# Patient Record
Sex: Male | Born: 1935 | Race: White | Hispanic: No | Marital: Married | Smoking: Former smoker
Health system: Southern US, Community
[De-identification: ages and names within clinical notes are randomized; demographics above are authoritative.]

## PROBLEM LIST (undated history)

## (undated) DIAGNOSIS — K922 Gastrointestinal hemorrhage, unspecified: Secondary | ICD-10-CM

## (undated) DIAGNOSIS — E291 Testicular hypofunction: Secondary | ICD-10-CM

## (undated) DIAGNOSIS — K279 Peptic ulcer, site unspecified, unspecified as acute or chronic, without hemorrhage or perforation: Secondary | ICD-10-CM

## (undated) DIAGNOSIS — K859 Acute pancreatitis without necrosis or infection, unspecified: Secondary | ICD-10-CM

## (undated) DIAGNOSIS — M1990 Unspecified osteoarthritis, unspecified site: Secondary | ICD-10-CM

## (undated) DIAGNOSIS — I2510 Atherosclerotic heart disease of native coronary artery without angina pectoris: Secondary | ICD-10-CM

## (undated) DIAGNOSIS — N289 Disorder of kidney and ureter, unspecified: Secondary | ICD-10-CM

## (undated) DIAGNOSIS — E041 Nontoxic single thyroid nodule: Secondary | ICD-10-CM

## (undated) DIAGNOSIS — E785 Hyperlipidemia, unspecified: Secondary | ICD-10-CM

## (undated) DIAGNOSIS — I252 Old myocardial infarction: Secondary | ICD-10-CM

## (undated) DIAGNOSIS — I10 Essential (primary) hypertension: Secondary | ICD-10-CM

## (undated) HISTORY — DX: Peptic ulcer, site unspecified, unspecified as acute or chronic, without hemorrhage or perforation: K27.9

## (undated) HISTORY — DX: Essential (primary) hypertension: I10

## (undated) HISTORY — DX: Nontoxic single thyroid nodule: E04.1

## (undated) HISTORY — DX: Disorder of kidney and ureter, unspecified: N28.9

## (undated) HISTORY — DX: Testicular hypofunction: E29.1

## (undated) HISTORY — DX: Hyperlipidemia, unspecified: E78.5

## (undated) HISTORY — PX: OTHER SURGICAL HISTORY: SHX169

## (undated) HISTORY — PX: CARDIAC CATHETERIZATION: SHX172

## (undated) HISTORY — PX: CARDIAC DEFIBRILLATOR PLACEMENT: SHX171

## (undated) HISTORY — DX: Unspecified osteoarthritis, unspecified site: M19.90

## (undated) HISTORY — DX: Acute pancreatitis without necrosis or infection, unspecified: K85.90

## (undated) HISTORY — PX: LESION REMOVAL: SHX5196

## (undated) HISTORY — DX: Gastrointestinal hemorrhage, unspecified: K92.2

## (undated) HISTORY — PX: LAPAROTOMY: SHX154

---

## 1959-02-16 DIAGNOSIS — K279 Peptic ulcer, site unspecified, unspecified as acute or chronic, without hemorrhage or perforation: Secondary | ICD-10-CM

## 1959-02-16 HISTORY — DX: Peptic ulcer, site unspecified, unspecified as acute or chronic, without hemorrhage or perforation: K27.9

## 1963-06-18 DIAGNOSIS — Z8711 Personal history of peptic ulcer disease: Secondary | ICD-10-CM | POA: Insufficient documentation

## 1964-06-17 DIAGNOSIS — Z87448 Personal history of other diseases of urinary system: Secondary | ICD-10-CM | POA: Insufficient documentation

## 1969-02-15 HISTORY — PX: CHOLECYSTECTOMY: SHX55

## 1981-06-17 DIAGNOSIS — I10 Essential (primary) hypertension: Secondary | ICD-10-CM

## 1988-06-17 DIAGNOSIS — M129 Arthropathy, unspecified: Secondary | ICD-10-CM | POA: Insufficient documentation

## 1990-12-08 ENCOUNTER — Encounter (INDEPENDENT_AMBULATORY_CARE_PROVIDER_SITE_OTHER): Payer: Self-pay | Admitting: Gastroenterology

## 1993-06-17 DIAGNOSIS — E785 Hyperlipidemia, unspecified: Secondary | ICD-10-CM | POA: Insufficient documentation

## 1997-06-17 ENCOUNTER — Encounter: Payer: Self-pay | Admitting: Family Medicine

## 1997-06-17 DIAGNOSIS — E1165 Type 2 diabetes mellitus with hyperglycemia: Secondary | ICD-10-CM

## 1997-06-17 DIAGNOSIS — E1129 Type 2 diabetes mellitus with other diabetic kidney complication: Secondary | ICD-10-CM

## 1997-06-17 HISTORY — PX: OTHER SURGICAL HISTORY: SHX169

## 1997-07-18 HISTORY — PX: OTHER SURGICAL HISTORY: SHX169

## 1998-10-16 ENCOUNTER — Encounter: Payer: Self-pay | Admitting: Family Medicine

## 1998-10-16 LAB — CONVERTED CEMR LAB: Microalbumin U total vol: 7.5 mg/L

## 1999-03-18 ENCOUNTER — Encounter: Payer: Self-pay | Admitting: Family Medicine

## 2000-01-16 ENCOUNTER — Encounter: Payer: Self-pay | Admitting: Family Medicine

## 2000-01-16 LAB — CONVERTED CEMR LAB
Microalbumin U total vol: 3.9 mg/L
PSA: 0.2 ng/mL

## 2000-09-08 ENCOUNTER — Encounter: Payer: Self-pay | Admitting: Family Medicine

## 2000-09-08 ENCOUNTER — Encounter: Admission: RE | Admit: 2000-09-08 | Discharge: 2000-09-08 | Payer: Self-pay | Admitting: Family Medicine

## 2000-09-08 ENCOUNTER — Inpatient Hospital Stay (HOSPITAL_COMMUNITY): Admission: EM | Admit: 2000-09-08 | Discharge: 2000-09-12 | Payer: Self-pay | Admitting: Internal Medicine

## 2000-09-09 ENCOUNTER — Encounter: Payer: Self-pay | Admitting: Internal Medicine

## 2000-09-10 ENCOUNTER — Encounter: Payer: Self-pay | Admitting: *Deleted

## 2000-09-10 ENCOUNTER — Encounter: Payer: Self-pay | Admitting: Gastroenterology

## 2000-09-11 ENCOUNTER — Encounter: Payer: Self-pay | Admitting: Gastroenterology

## 2000-12-15 DIAGNOSIS — K279 Peptic ulcer, site unspecified, unspecified as acute or chronic, without hemorrhage or perforation: Secondary | ICD-10-CM | POA: Insufficient documentation

## 2001-01-15 ENCOUNTER — Encounter: Payer: Self-pay | Admitting: Family Medicine

## 2001-01-15 LAB — CONVERTED CEMR LAB: PSA: 0.3 ng/mL

## 2001-07-08 DIAGNOSIS — E349 Endocrine disorder, unspecified: Secondary | ICD-10-CM | POA: Insufficient documentation

## 2002-01-15 ENCOUNTER — Encounter: Payer: Self-pay | Admitting: Family Medicine

## 2002-07-22 ENCOUNTER — Encounter: Payer: Self-pay | Admitting: Family Medicine

## 2002-07-22 ENCOUNTER — Encounter: Admission: RE | Admit: 2002-07-22 | Discharge: 2002-07-22 | Payer: Self-pay | Admitting: Family Medicine

## 2002-10-26 ENCOUNTER — Encounter: Payer: Self-pay | Admitting: Family Medicine

## 2002-10-26 ENCOUNTER — Encounter: Admission: RE | Admit: 2002-10-26 | Discharge: 2002-10-26 | Payer: Self-pay | Admitting: Family Medicine

## 2003-01-16 ENCOUNTER — Encounter: Payer: Self-pay | Admitting: Family Medicine

## 2003-01-16 LAB — CONVERTED CEMR LAB
Hgb A1c MFr Bld: 6.8 %
Microalbumin U total vol: 11.2 mg/L
PSA: 0.5 ng/mL

## 2003-07-19 ENCOUNTER — Encounter: Payer: Self-pay | Admitting: Family Medicine

## 2003-07-19 LAB — CONVERTED CEMR LAB: Hgb A1c MFr Bld: 8.3 %

## 2003-10-16 ENCOUNTER — Encounter: Payer: Self-pay | Admitting: Family Medicine

## 2004-04-18 ENCOUNTER — Ambulatory Visit: Payer: Self-pay | Admitting: Family Medicine

## 2004-05-16 ENCOUNTER — Ambulatory Visit: Payer: Self-pay | Admitting: Family Medicine

## 2004-06-14 ENCOUNTER — Ambulatory Visit: Payer: Self-pay | Admitting: Family Medicine

## 2004-07-13 ENCOUNTER — Ambulatory Visit: Payer: Self-pay | Admitting: Family Medicine

## 2004-08-15 ENCOUNTER — Ambulatory Visit: Payer: Self-pay | Admitting: Family Medicine

## 2004-08-15 LAB — CONVERTED CEMR LAB
Hgb A1c MFr Bld: 6.9 %
PSA: 0.38 ng/mL

## 2004-09-10 ENCOUNTER — Ambulatory Visit: Payer: Self-pay | Admitting: Family Medicine

## 2004-09-11 ENCOUNTER — Ambulatory Visit: Payer: Self-pay | Admitting: Family Medicine

## 2004-10-10 ENCOUNTER — Ambulatory Visit: Payer: Self-pay | Admitting: Family Medicine

## 2004-11-20 ENCOUNTER — Ambulatory Visit: Payer: Self-pay | Admitting: Family Medicine

## 2004-12-13 ENCOUNTER — Ambulatory Visit: Payer: Self-pay | Admitting: Family Medicine

## 2005-01-18 ENCOUNTER — Ambulatory Visit: Payer: Self-pay | Admitting: Family Medicine

## 2005-02-22 ENCOUNTER — Ambulatory Visit: Payer: Self-pay | Admitting: Family Medicine

## 2005-03-12 ENCOUNTER — Ambulatory Visit: Payer: Self-pay | Admitting: Family Medicine

## 2005-03-14 ENCOUNTER — Ambulatory Visit: Payer: Self-pay | Admitting: Family Medicine

## 2005-03-19 ENCOUNTER — Ambulatory Visit: Payer: Self-pay | Admitting: Family Medicine

## 2005-04-17 ENCOUNTER — Encounter: Payer: Self-pay | Admitting: Family Medicine

## 2005-04-19 ENCOUNTER — Ambulatory Visit: Payer: Self-pay | Admitting: Family Medicine

## 2005-05-16 ENCOUNTER — Ambulatory Visit: Payer: Self-pay | Admitting: Family Medicine

## 2005-05-21 ENCOUNTER — Ambulatory Visit: Payer: Self-pay | Admitting: Family Medicine

## 2005-06-19 ENCOUNTER — Ambulatory Visit: Payer: Self-pay | Admitting: Family Medicine

## 2005-07-17 ENCOUNTER — Ambulatory Visit: Payer: Self-pay | Admitting: Family Medicine

## 2005-08-27 ENCOUNTER — Ambulatory Visit: Payer: Self-pay | Admitting: Family Medicine

## 2005-09-16 ENCOUNTER — Ambulatory Visit: Payer: Self-pay | Admitting: Family Medicine

## 2005-10-22 ENCOUNTER — Ambulatory Visit: Payer: Self-pay | Admitting: Family Medicine

## 2005-11-20 ENCOUNTER — Ambulatory Visit: Payer: Self-pay | Admitting: Family Medicine

## 2005-12-15 ENCOUNTER — Encounter: Payer: Self-pay | Admitting: Family Medicine

## 2005-12-15 LAB — CONVERTED CEMR LAB: Hgb A1c MFr Bld: 7.6 %

## 2005-12-23 ENCOUNTER — Ambulatory Visit: Payer: Self-pay | Admitting: Family Medicine

## 2005-12-30 ENCOUNTER — Ambulatory Visit: Payer: Self-pay | Admitting: Family Medicine

## 2006-01-02 ENCOUNTER — Ambulatory Visit: Payer: Self-pay | Admitting: Family Medicine

## 2006-01-17 ENCOUNTER — Ambulatory Visit: Payer: Self-pay | Admitting: Family Medicine

## 2006-02-18 ENCOUNTER — Ambulatory Visit: Payer: Self-pay | Admitting: Family Medicine

## 2006-02-20 ENCOUNTER — Ambulatory Visit: Payer: Self-pay | Admitting: Family Medicine

## 2006-03-19 ENCOUNTER — Ambulatory Visit: Payer: Self-pay | Admitting: Internal Medicine

## 2006-04-03 ENCOUNTER — Ambulatory Visit: Payer: Self-pay | Admitting: Family Medicine

## 2006-04-17 ENCOUNTER — Encounter: Payer: Self-pay | Admitting: Family Medicine

## 2006-04-17 LAB — CONVERTED CEMR LAB: Hgb A1c MFr Bld: 7.3 %

## 2006-04-18 ENCOUNTER — Ambulatory Visit: Payer: Self-pay | Admitting: Family Medicine

## 2006-05-16 ENCOUNTER — Ambulatory Visit: Payer: Self-pay | Admitting: Family Medicine

## 2006-05-20 ENCOUNTER — Ambulatory Visit: Payer: Self-pay | Admitting: Family Medicine

## 2006-06-27 ENCOUNTER — Ambulatory Visit: Payer: Self-pay | Admitting: Family Medicine

## 2006-07-22 ENCOUNTER — Ambulatory Visit: Payer: Self-pay | Admitting: Family Medicine

## 2006-08-20 ENCOUNTER — Ambulatory Visit: Payer: Self-pay | Admitting: Family Medicine

## 2006-09-20 ENCOUNTER — Encounter: Payer: Self-pay | Admitting: Family Medicine

## 2006-09-23 ENCOUNTER — Ambulatory Visit: Payer: Self-pay | Admitting: Family Medicine

## 2006-10-31 ENCOUNTER — Ambulatory Visit: Payer: Self-pay | Admitting: Family Medicine

## 2006-11-13 ENCOUNTER — Encounter: Admission: RE | Admit: 2006-11-13 | Discharge: 2006-11-13 | Payer: Self-pay | Admitting: General Surgery

## 2006-11-14 ENCOUNTER — Ambulatory Visit (HOSPITAL_BASED_OUTPATIENT_CLINIC_OR_DEPARTMENT_OTHER): Admission: RE | Admit: 2006-11-14 | Discharge: 2006-11-14 | Payer: Self-pay | Admitting: General Surgery

## 2006-11-14 ENCOUNTER — Encounter (INDEPENDENT_AMBULATORY_CARE_PROVIDER_SITE_OTHER): Payer: Self-pay | Admitting: General Surgery

## 2006-11-26 ENCOUNTER — Ambulatory Visit: Payer: Self-pay | Admitting: Family Medicine

## 2006-12-10 ENCOUNTER — Encounter: Payer: Self-pay | Admitting: Family Medicine

## 2006-12-18 ENCOUNTER — Ambulatory Visit: Payer: Self-pay | Admitting: Family Medicine

## 2006-12-31 ENCOUNTER — Encounter (INDEPENDENT_AMBULATORY_CARE_PROVIDER_SITE_OTHER): Payer: Self-pay | Admitting: *Deleted

## 2006-12-31 ENCOUNTER — Ambulatory Visit: Payer: Self-pay | Admitting: Family Medicine

## 2006-12-31 DIAGNOSIS — M129 Arthropathy, unspecified: Secondary | ICD-10-CM | POA: Insufficient documentation

## 2006-12-31 LAB — CONVERTED CEMR LAB
Bilirubin, Direct: 0.1 mg/dL (ref 0.0–0.3)
Calcium: 9.3 mg/dL (ref 8.4–10.5)
Creatinine,U: 170.7 mg/dL
Eosinophils Absolute: 0.2 10*3/uL (ref 0.0–0.6)
Eosinophils Relative: 3.3 % (ref 0.0–5.0)
GFR calc Af Amer: 42 mL/min
GFR calc non Af Amer: 34 mL/min
Glucose, Bld: 113 mg/dL — ABNORMAL HIGH (ref 70–99)
HDL: 38.5 mg/dL — ABNORMAL LOW (ref >39.0)
Hgb A1c MFr Bld: 7.1 % — ABNORMAL HIGH (ref 4.6–6.0)
Lymphocytes Relative: 28.1 % (ref 12.0–46.0)
MCHC: 33.9 g/dL (ref 30.0–36.0)
MCV: 87 fL (ref 78.0–100.0)
Microalb Creat Ratio: 13.5 mg/g (ref 0.0–30.0)
Neutro Abs: 2.8 10*3/uL (ref 1.4–7.7)
Neutrophils Relative %: 55.3 % (ref 43.0–77.0)
Platelets: 252 10*3/uL (ref 150–400)
Sodium: 143 meq/L (ref 135–145)
WBC: 5 10*3/uL (ref 4.5–10.5)

## 2007-01-05 ENCOUNTER — Ambulatory Visit: Payer: Self-pay | Admitting: Family Medicine

## 2007-01-30 ENCOUNTER — Ambulatory Visit: Payer: Self-pay | Admitting: Family Medicine

## 2007-02-25 ENCOUNTER — Ambulatory Visit: Payer: Self-pay | Admitting: Family Medicine

## 2007-03-27 ENCOUNTER — Ambulatory Visit: Payer: Self-pay | Admitting: Family Medicine

## 2007-04-23 ENCOUNTER — Ambulatory Visit: Payer: Self-pay | Admitting: Family Medicine

## 2007-05-25 ENCOUNTER — Ambulatory Visit: Payer: Self-pay | Admitting: Family Medicine

## 2007-06-26 ENCOUNTER — Ambulatory Visit: Payer: Self-pay | Admitting: Family Medicine

## 2007-07-27 ENCOUNTER — Ambulatory Visit: Payer: Self-pay | Admitting: Family Medicine

## 2007-08-20 ENCOUNTER — Ambulatory Visit: Payer: Self-pay | Admitting: Family Medicine

## 2007-08-26 ENCOUNTER — Ambulatory Visit: Payer: Self-pay | Admitting: Internal Medicine

## 2007-09-07 ENCOUNTER — Ambulatory Visit: Payer: Self-pay | Admitting: Family Medicine

## 2007-09-07 DIAGNOSIS — E669 Obesity, unspecified: Secondary | ICD-10-CM | POA: Insufficient documentation

## 2007-09-17 ENCOUNTER — Ambulatory Visit: Payer: Self-pay | Admitting: Family Medicine

## 2007-09-17 DIAGNOSIS — R197 Diarrhea, unspecified: Secondary | ICD-10-CM

## 2007-10-01 ENCOUNTER — Ambulatory Visit: Payer: Self-pay | Admitting: Family Medicine

## 2007-10-06 ENCOUNTER — Ambulatory Visit: Payer: Self-pay | Admitting: Family Medicine

## 2007-10-15 ENCOUNTER — Ambulatory Visit: Payer: Self-pay | Admitting: Family Medicine

## 2007-11-02 ENCOUNTER — Ambulatory Visit: Payer: Self-pay | Admitting: Family Medicine

## 2007-11-03 ENCOUNTER — Encounter (INDEPENDENT_AMBULATORY_CARE_PROVIDER_SITE_OTHER): Payer: Self-pay | Admitting: Internal Medicine

## 2007-11-27 ENCOUNTER — Ambulatory Visit: Payer: Self-pay | Admitting: Internal Medicine

## 2007-11-27 DIAGNOSIS — K589 Irritable bowel syndrome without diarrhea: Secondary | ICD-10-CM | POA: Insufficient documentation

## 2007-11-27 DIAGNOSIS — M1990 Unspecified osteoarthritis, unspecified site: Secondary | ICD-10-CM | POA: Insufficient documentation

## 2007-12-01 ENCOUNTER — Ambulatory Visit: Payer: Self-pay | Admitting: Family Medicine

## 2007-12-01 LAB — CONVERTED CEMR LAB
ALT: 27 units/L (ref 0–53)
AST: 29 units/L (ref 0–37)
Basophils Absolute: 0 10*3/uL (ref 0.0–0.1)
Basophils Relative: 0.9 % (ref 0.0–1.0)
CO2: 23 meq/L (ref 19–32)
Creatinine, Ser: 1.5 mg/dL (ref 0.4–1.5)
Eosinophils Absolute: 0.1 10*3/uL (ref 0.0–0.7)
GFR calc Af Amer: 58 mL/min
Hemoglobin: 13.6 g/dL (ref 13.0–17.0)
MCHC: 34.3 g/dL (ref 30.0–36.0)
MCV: 86.3 fL (ref 78.0–100.0)
Neutro Abs: 3.6 10*3/uL (ref 1.4–7.7)
RBC: 4.6 M/uL (ref 4.22–5.81)
Total Bilirubin: 0.7 mg/dL (ref 0.3–1.2)

## 2007-12-16 HISTORY — PX: COLONOSCOPY: SHX174

## 2007-12-28 ENCOUNTER — Ambulatory Visit: Payer: Self-pay | Admitting: Family Medicine

## 2007-12-29 ENCOUNTER — Encounter: Payer: Self-pay | Admitting: Internal Medicine

## 2007-12-29 ENCOUNTER — Ambulatory Visit: Payer: Self-pay | Admitting: Internal Medicine

## 2007-12-29 LAB — HM COLONOSCOPY: HM Colonoscopy: 4

## 2008-02-02 ENCOUNTER — Ambulatory Visit: Payer: Self-pay | Admitting: Family Medicine

## 2008-02-05 ENCOUNTER — Ambulatory Visit: Payer: Self-pay | Admitting: Family Medicine

## 2008-02-06 LAB — CONVERTED CEMR LAB
Alkaline Phosphatase: 54 units/L (ref 39–117)
Basophils Absolute: 0 10*3/uL (ref 0.0–0.1)
Bilirubin, Direct: 0.1 mg/dL (ref 0.0–0.3)
Cholesterol: 223 mg/dL (ref 0–200)
Eosinophils Absolute: 0.2 10*3/uL (ref 0.0–0.7)
GFR calc Af Amer: 50 mL/min
GFR calc non Af Amer: 41 mL/min
HCT: 39.8 % (ref 39.0–52.0)
Hgb A1c MFr Bld: 7.8 % — ABNORMAL HIGH (ref 4.6–6.0)
MCHC: 33.7 g/dL (ref 30.0–36.0)
MCV: 87.3 fL (ref 78.0–100.0)
Microalb Creat Ratio: 43.2 mg/g — ABNORMAL HIGH (ref 0.0–30.0)
Microalb, Ur: 7 mg/dL — ABNORMAL HIGH (ref 0.0–1.9)
Monocytes Absolute: 0.5 10*3/uL (ref 0.1–1.0)
Monocytes Relative: 8.9 % (ref 3.0–12.0)
PSA: 0.44 ng/mL (ref 0.10–4.00)
Platelets: 239 10*3/uL (ref 150–400)
Potassium: 4.9 meq/L (ref 3.5–5.1)
RDW: 14.4 % (ref 11.5–14.6)
Sodium: 142 meq/L (ref 135–145)
Testosterone: 754.57 ng/dL (ref 350.00–890)
Total Bilirubin: 0.7 mg/dL (ref 0.3–1.2)
Total CHOL/HDL Ratio: 6.6
Triglycerides: 220 mg/dL (ref 0–149)

## 2008-02-10 ENCOUNTER — Ambulatory Visit: Payer: Self-pay | Admitting: Family Medicine

## 2008-02-25 ENCOUNTER — Ambulatory Visit: Payer: Self-pay | Admitting: Family Medicine

## 2008-03-04 ENCOUNTER — Telehealth: Payer: Self-pay | Admitting: Family Medicine

## 2008-03-23 ENCOUNTER — Ambulatory Visit: Payer: Self-pay | Admitting: Family Medicine

## 2008-04-21 ENCOUNTER — Telehealth: Payer: Self-pay | Admitting: Family Medicine

## 2008-04-21 ENCOUNTER — Ambulatory Visit: Payer: Self-pay | Admitting: Family Medicine

## 2008-05-25 ENCOUNTER — Ambulatory Visit: Payer: Self-pay | Admitting: Family Medicine

## 2008-06-21 ENCOUNTER — Encounter: Payer: Self-pay | Admitting: Family Medicine

## 2008-06-22 ENCOUNTER — Ambulatory Visit: Payer: Self-pay | Admitting: Family Medicine

## 2008-07-10 ENCOUNTER — Ambulatory Visit: Payer: Self-pay | Admitting: Internal Medicine

## 2008-07-10 ENCOUNTER — Inpatient Hospital Stay (HOSPITAL_COMMUNITY): Admission: EM | Admit: 2008-07-10 | Discharge: 2008-07-13 | Payer: Self-pay | Admitting: Emergency Medicine

## 2008-07-11 ENCOUNTER — Ambulatory Visit: Payer: Self-pay | Admitting: Gastroenterology

## 2008-07-11 ENCOUNTER — Encounter: Payer: Self-pay | Admitting: Internal Medicine

## 2008-07-12 ENCOUNTER — Encounter: Payer: Self-pay | Admitting: Gastroenterology

## 2008-07-12 ENCOUNTER — Encounter: Payer: Self-pay | Admitting: Family Medicine

## 2008-07-12 HISTORY — PX: ESOPHAGOGASTRODUODENOSCOPY: SHX1529

## 2008-07-12 HISTORY — PX: OTHER SURGICAL HISTORY: SHX169

## 2008-07-13 ENCOUNTER — Encounter: Payer: Self-pay | Admitting: Family Medicine

## 2008-07-13 ENCOUNTER — Telehealth: Payer: Self-pay | Admitting: Internal Medicine

## 2008-07-19 ENCOUNTER — Ambulatory Visit: Payer: Self-pay | Admitting: Family Medicine

## 2008-07-26 ENCOUNTER — Ambulatory Visit: Payer: Self-pay | Admitting: Family Medicine

## 2008-08-02 ENCOUNTER — Ambulatory Visit: Payer: Self-pay | Admitting: Internal Medicine

## 2008-08-02 ENCOUNTER — Ambulatory Visit: Payer: Self-pay | Admitting: Cardiology

## 2008-08-02 DIAGNOSIS — K219 Gastro-esophageal reflux disease without esophagitis: Secondary | ICD-10-CM

## 2008-08-03 DIAGNOSIS — R933 Abnormal findings on diagnostic imaging of other parts of digestive tract: Secondary | ICD-10-CM

## 2008-08-03 LAB — CONVERTED CEMR LAB
ALT: 30 units/L (ref 0–53)
AST: 29 units/L (ref 0–37)
Amylase: 33 units/L (ref 27–131)
BUN: 28 mg/dL — ABNORMAL HIGH (ref 6–23)
Creatinine, Ser: 1.8 mg/dL — ABNORMAL HIGH (ref 0.4–1.5)
GFR calc Af Amer: 47 mL/min
Lipase: 29 units/L (ref 11.0–59.0)
Total Bilirubin: 0.6 mg/dL (ref 0.3–1.2)

## 2008-08-05 ENCOUNTER — Encounter: Payer: Self-pay | Admitting: Cardiology

## 2008-08-05 ENCOUNTER — Encounter: Payer: Self-pay | Admitting: Family Medicine

## 2008-08-05 ENCOUNTER — Ambulatory Visit: Payer: Self-pay

## 2008-08-09 ENCOUNTER — Encounter: Admission: RE | Admit: 2008-08-09 | Discharge: 2008-08-09 | Payer: Self-pay | Admitting: Internal Medicine

## 2008-08-11 ENCOUNTER — Telehealth: Payer: Self-pay | Admitting: Family Medicine

## 2008-08-12 ENCOUNTER — Ambulatory Visit: Payer: Self-pay | Admitting: Cardiology

## 2008-08-12 LAB — CONVERTED CEMR LAB
Basophils Absolute: 0 10*3/uL (ref 0.0–0.1)
Basophils Relative: 0.4 % (ref 0.0–3.0)
CO2: 25 meq/L (ref 19–32)
Chloride: 103 meq/L (ref 96–112)
Creatinine, Ser: 1.8 mg/dL — ABNORMAL HIGH (ref 0.4–1.5)
Eosinophils Absolute: 0.3 10*3/uL (ref 0.0–0.7)
GFR calc non Af Amer: 39 mL/min
Lymphocytes Relative: 19.9 % (ref 12.0–46.0)
MCHC: 33.4 g/dL (ref 30.0–36.0)
MCV: 87.4 fL (ref 78.0–100.0)
Neutrophils Relative %: 61.9 % (ref 43.0–77.0)
Platelets: 192 10*3/uL (ref 150–400)
Potassium: 4.4 meq/L (ref 3.5–5.1)
Prothrombin Time: 11.2 s (ref 10.9–13.3)
RBC: 4.73 M/uL (ref 4.22–5.81)
WBC: 5.3 10*3/uL (ref 4.5–10.5)
aPTT: 28.5 s (ref 21.7–29.8)

## 2008-08-16 ENCOUNTER — Ambulatory Visit: Payer: Self-pay | Admitting: Cardiology

## 2008-08-16 ENCOUNTER — Inpatient Hospital Stay (HOSPITAL_COMMUNITY): Admission: AD | Admit: 2008-08-16 | Discharge: 2008-08-18 | Payer: Self-pay | Admitting: Cardiology

## 2008-08-23 ENCOUNTER — Encounter: Payer: Self-pay | Admitting: Internal Medicine

## 2008-08-23 ENCOUNTER — Ambulatory Visit: Payer: Self-pay | Admitting: Surgery

## 2008-08-23 ENCOUNTER — Encounter: Payer: Self-pay | Admitting: Family Medicine

## 2008-08-25 ENCOUNTER — Ambulatory Visit: Payer: Self-pay | Admitting: Family Medicine

## 2008-08-30 ENCOUNTER — Ambulatory Visit: Payer: Self-pay | Admitting: Surgery

## 2008-09-01 ENCOUNTER — Encounter: Payer: Self-pay | Admitting: Surgery

## 2008-09-01 ENCOUNTER — Ambulatory Visit (HOSPITAL_COMMUNITY): Admission: RE | Admit: 2008-09-01 | Discharge: 2008-09-01 | Payer: Self-pay | Admitting: Surgery

## 2008-09-05 ENCOUNTER — Inpatient Hospital Stay (HOSPITAL_COMMUNITY): Admission: RE | Admit: 2008-09-05 | Discharge: 2008-09-10 | Payer: Self-pay | Admitting: Surgery

## 2008-09-05 ENCOUNTER — Ambulatory Visit: Payer: Self-pay | Admitting: Surgery

## 2008-09-05 HISTORY — PX: CORONARY ARTERY BYPASS GRAFT: SHX141

## 2008-09-15 ENCOUNTER — Ambulatory Visit: Payer: Self-pay | Admitting: Cardiology

## 2008-09-15 ENCOUNTER — Encounter: Payer: Self-pay | Admitting: Cardiology

## 2008-09-22 ENCOUNTER — Ambulatory Visit: Payer: Self-pay | Admitting: Family Medicine

## 2008-09-29 ENCOUNTER — Encounter (HOSPITAL_COMMUNITY): Admission: RE | Admit: 2008-09-29 | Discharge: 2008-12-28 | Payer: Self-pay | Admitting: Cardiology

## 2008-09-29 ENCOUNTER — Encounter: Payer: Self-pay | Admitting: Family Medicine

## 2008-10-07 ENCOUNTER — Encounter: Admission: RE | Admit: 2008-10-07 | Discharge: 2008-10-07 | Payer: Self-pay | Admitting: Surgery

## 2008-10-07 ENCOUNTER — Ambulatory Visit: Payer: Self-pay | Admitting: Surgery

## 2008-10-20 ENCOUNTER — Telehealth: Payer: Self-pay | Admitting: Family Medicine

## 2008-10-21 ENCOUNTER — Ambulatory Visit: Payer: Self-pay | Admitting: Internal Medicine

## 2008-10-21 ENCOUNTER — Inpatient Hospital Stay (HOSPITAL_COMMUNITY): Admission: EM | Admit: 2008-10-21 | Discharge: 2008-10-26 | Payer: Self-pay | Admitting: Emergency Medicine

## 2008-10-22 ENCOUNTER — Telehealth (INDEPENDENT_AMBULATORY_CARE_PROVIDER_SITE_OTHER): Payer: Self-pay | Admitting: Physician Assistant

## 2008-10-26 ENCOUNTER — Encounter: Payer: Self-pay | Admitting: Family Medicine

## 2008-10-27 ENCOUNTER — Ambulatory Visit: Payer: Self-pay | Admitting: Family Medicine

## 2008-10-31 ENCOUNTER — Encounter: Payer: Self-pay | Admitting: Family Medicine

## 2008-11-10 ENCOUNTER — Ambulatory Visit: Payer: Self-pay | Admitting: Family Medicine

## 2008-11-17 ENCOUNTER — Ambulatory Visit: Payer: Self-pay | Admitting: Internal Medicine

## 2008-11-17 ENCOUNTER — Telehealth: Payer: Self-pay | Admitting: Family Medicine

## 2008-11-17 ENCOUNTER — Ambulatory Visit: Payer: Self-pay | Admitting: Cardiology

## 2008-11-17 ENCOUNTER — Inpatient Hospital Stay (HOSPITAL_COMMUNITY): Admission: EM | Admit: 2008-11-17 | Discharge: 2008-11-22 | Payer: Self-pay | Admitting: Emergency Medicine

## 2008-11-18 ENCOUNTER — Encounter: Payer: Self-pay | Admitting: Cardiology

## 2008-11-18 ENCOUNTER — Telehealth: Payer: Self-pay | Admitting: Cardiology

## 2008-11-23 ENCOUNTER — Ambulatory Visit (HOSPITAL_COMMUNITY): Admission: RE | Admit: 2008-11-23 | Discharge: 2008-11-23 | Payer: Self-pay | Admitting: Urology

## 2008-11-28 ENCOUNTER — Ambulatory Visit: Payer: Self-pay | Admitting: Cardiology

## 2008-11-30 ENCOUNTER — Encounter: Payer: Self-pay | Admitting: Internal Medicine

## 2008-11-30 ENCOUNTER — Encounter: Payer: Self-pay | Admitting: Family Medicine

## 2008-11-30 ENCOUNTER — Encounter: Payer: Self-pay | Admitting: Cardiology

## 2008-12-07 ENCOUNTER — Ambulatory Visit: Payer: Self-pay

## 2008-12-07 ENCOUNTER — Encounter: Payer: Self-pay | Admitting: Cardiology

## 2008-12-07 ENCOUNTER — Ambulatory Visit: Payer: Self-pay | Admitting: Cardiology

## 2008-12-07 DIAGNOSIS — I5022 Chronic systolic (congestive) heart failure: Secondary | ICD-10-CM

## 2008-12-07 LAB — CONVERTED CEMR LAB
CO2: 28 meq/L (ref 19–32)
Chloride: 105 meq/L (ref 96–112)
Creatinine, Ser: 1.4 mg/dL (ref 0.4–1.5)
Potassium: 4.9 meq/L (ref 3.5–5.1)
Sodium: 138 meq/L (ref 135–145)

## 2008-12-10 ENCOUNTER — Ambulatory Visit (HOSPITAL_COMMUNITY): Admission: RE | Admit: 2008-12-10 | Discharge: 2008-12-10 | Payer: Self-pay | Admitting: Urology

## 2008-12-10 ENCOUNTER — Encounter: Payer: Self-pay | Admitting: Family Medicine

## 2008-12-14 ENCOUNTER — Encounter: Payer: Self-pay | Admitting: Cardiology

## 2008-12-14 ENCOUNTER — Encounter: Payer: Self-pay | Admitting: Family Medicine

## 2008-12-14 ENCOUNTER — Encounter: Payer: Self-pay | Admitting: Internal Medicine

## 2009-01-03 ENCOUNTER — Ambulatory Visit: Payer: Self-pay | Admitting: Cardiology

## 2009-01-09 ENCOUNTER — Encounter (INDEPENDENT_AMBULATORY_CARE_PROVIDER_SITE_OTHER): Payer: Self-pay | Admitting: Urology

## 2009-01-09 ENCOUNTER — Inpatient Hospital Stay (HOSPITAL_COMMUNITY): Admission: RE | Admit: 2009-01-09 | Discharge: 2009-01-15 | Payer: Self-pay | Admitting: Urology

## 2009-01-09 DIAGNOSIS — C649 Malignant neoplasm of unspecified kidney, except renal pelvis: Secondary | ICD-10-CM

## 2009-01-09 HISTORY — PX: NEPHRECTOMY: SHX65

## 2009-01-12 ENCOUNTER — Encounter: Payer: Self-pay | Admitting: Cardiology

## 2009-01-12 ENCOUNTER — Encounter: Payer: Self-pay | Admitting: Family Medicine

## 2009-01-15 ENCOUNTER — Encounter: Payer: Self-pay | Admitting: Family Medicine

## 2009-01-24 ENCOUNTER — Encounter: Payer: Self-pay | Admitting: Cardiology

## 2009-01-24 ENCOUNTER — Encounter: Payer: Self-pay | Admitting: Family Medicine

## 2009-01-26 ENCOUNTER — Ambulatory Visit: Payer: Self-pay | Admitting: Family Medicine

## 2009-01-31 ENCOUNTER — Ambulatory Visit: Payer: Self-pay | Admitting: Family Medicine

## 2009-02-01 LAB — CONVERTED CEMR LAB
CO2: 28 meq/L (ref 19–32)
Calcium: 8.8 mg/dL (ref 8.4–10.5)
Chloride: 108 meq/L (ref 96–112)
Glucose, Bld: 159 mg/dL — ABNORMAL HIGH (ref 70–99)
Potassium: 4.4 meq/L (ref 3.5–5.1)
Sodium: 142 meq/L (ref 135–145)
Testosterone: 316.32 ng/dL — ABNORMAL LOW (ref 350.00–890.00)

## 2009-03-06 ENCOUNTER — Ambulatory Visit: Payer: Self-pay | Admitting: Family Medicine

## 2009-03-15 ENCOUNTER — Telehealth: Payer: Self-pay | Admitting: Family Medicine

## 2009-03-16 ENCOUNTER — Telehealth: Payer: Self-pay | Admitting: Family Medicine

## 2009-03-16 ENCOUNTER — Ambulatory Visit: Payer: Self-pay | Admitting: Family Medicine

## 2009-03-17 ENCOUNTER — Encounter: Payer: Self-pay | Admitting: Family Medicine

## 2009-04-11 ENCOUNTER — Ambulatory Visit: Payer: Self-pay | Admitting: Family Medicine

## 2009-04-25 ENCOUNTER — Ambulatory Visit: Payer: Self-pay | Admitting: Cardiology

## 2009-04-28 ENCOUNTER — Encounter: Payer: Self-pay | Admitting: Family Medicine

## 2009-04-28 ENCOUNTER — Encounter: Payer: Self-pay | Admitting: Cardiology

## 2009-05-17 ENCOUNTER — Encounter: Payer: Self-pay | Admitting: Family Medicine

## 2009-05-17 ENCOUNTER — Encounter: Payer: Self-pay | Admitting: Cardiology

## 2009-05-19 ENCOUNTER — Telehealth: Payer: Self-pay | Admitting: Cardiology

## 2009-05-23 ENCOUNTER — Ambulatory Visit: Payer: Self-pay | Admitting: Family Medicine

## 2009-05-24 ENCOUNTER — Encounter: Admission: RE | Admit: 2009-05-24 | Discharge: 2009-05-24 | Payer: Self-pay | Admitting: Nephrology

## 2009-05-29 ENCOUNTER — Ambulatory Visit: Payer: Self-pay | Admitting: Family Medicine

## 2009-05-29 LAB — CONVERTED CEMR LAB
AST: 19 units/L (ref 0–37)
Albumin: 3.6 g/dL (ref 3.5–5.2)
Alkaline Phosphatase: 100 units/L (ref 39–117)
BUN: 26 mg/dL — ABNORMAL HIGH (ref 6–23)
Basophils Absolute: 0 10*3/uL (ref 0.0–0.1)
Bilirubin, Direct: 0.1 mg/dL (ref 0.0–0.3)
CO2: 27 meq/L (ref 19–32)
Chloride: 108 meq/L (ref 96–112)
HCT: 37.5 % — ABNORMAL LOW (ref 39.0–52.0)
Hgb A1c MFr Bld: 9.1 % — ABNORMAL HIGH (ref 4.6–6.5)
Lymphs Abs: 1.4 10*3/uL (ref 0.7–4.0)
MCV: 83.2 fL (ref 78.0–100.0)
Monocytes Absolute: 0.8 10*3/uL (ref 0.1–1.0)
Monocytes Relative: 8 % (ref 3.0–12.0)
Neutrophils Relative %: 74.2 % (ref 43.0–77.0)
Phosphorus: 3.5 mg/dL (ref 2.3–4.6)
Platelets: 236 10*3/uL (ref 150.0–400.0)
Potassium: 4.6 meq/L (ref 3.5–5.1)
RDW: 15.1 % — ABNORMAL HIGH (ref 11.5–14.6)
TSH: 2.31 microintl units/mL (ref 0.35–5.50)
Total Protein: 6.9 g/dL (ref 6.0–8.3)

## 2009-06-05 ENCOUNTER — Ambulatory Visit: Payer: Self-pay | Admitting: Family Medicine

## 2009-06-21 ENCOUNTER — Encounter: Payer: Self-pay | Admitting: Family Medicine

## 2009-06-22 ENCOUNTER — Ambulatory Visit: Payer: Self-pay | Admitting: Family Medicine

## 2009-07-28 ENCOUNTER — Encounter: Payer: Self-pay | Admitting: Family Medicine

## 2009-08-03 ENCOUNTER — Ambulatory Visit: Payer: Self-pay | Admitting: Family Medicine

## 2009-08-15 ENCOUNTER — Encounter: Admission: RE | Admit: 2009-08-15 | Discharge: 2009-08-15 | Payer: Self-pay | Admitting: Surgery

## 2009-08-30 ENCOUNTER — Ambulatory Visit: Payer: Self-pay | Admitting: Family Medicine

## 2009-09-19 ENCOUNTER — Telehealth: Payer: Self-pay | Admitting: Internal Medicine

## 2009-10-04 ENCOUNTER — Ambulatory Visit: Payer: Self-pay | Admitting: Family Medicine

## 2009-10-04 LAB — CONVERTED CEMR LAB
Basophils Relative: 0.6 % (ref 0.0–3.0)
CO2: 27 meq/L (ref 19–32)
Calcium: 9.2 mg/dL (ref 8.4–10.5)
Eosinophils Relative: 5.3 % — ABNORMAL HIGH (ref 0.0–5.0)
HCT: 39.7 % (ref 39.0–52.0)
Hemoglobin: 13.5 g/dL (ref 13.0–17.0)
Lymphs Abs: 1.3 10*3/uL (ref 0.7–4.0)
MCV: 84.3 fL (ref 78.0–100.0)
Monocytes Absolute: 0.8 10*3/uL (ref 0.1–1.0)
Neutro Abs: 4.1 10*3/uL (ref 1.4–7.7)
Neutrophils Relative %: 62.3 % (ref 43.0–77.0)
Potassium: 5.3 meq/L — ABNORMAL HIGH (ref 3.5–5.1)
RBC: 4.71 M/uL (ref 4.22–5.81)
Sodium: 142 meq/L (ref 135–145)
WBC: 6.5 10*3/uL (ref 4.5–10.5)

## 2009-10-05 LAB — CONVERTED CEMR LAB: Vit D, 25-Hydroxy: 20 ng/mL — ABNORMAL LOW (ref 30–89)

## 2009-10-09 ENCOUNTER — Ambulatory Visit: Payer: Self-pay | Admitting: Family Medicine

## 2009-10-09 DIAGNOSIS — E559 Vitamin D deficiency, unspecified: Secondary | ICD-10-CM | POA: Insufficient documentation

## 2009-10-16 ENCOUNTER — Encounter: Payer: Self-pay | Admitting: Family Medicine

## 2009-10-16 ENCOUNTER — Encounter: Payer: Self-pay | Admitting: Cardiology

## 2009-10-23 ENCOUNTER — Encounter: Payer: Self-pay | Admitting: Family Medicine

## 2009-10-23 ENCOUNTER — Encounter: Payer: Self-pay | Admitting: Cardiology

## 2009-10-24 ENCOUNTER — Ambulatory Visit: Payer: Self-pay | Admitting: Cardiology

## 2009-11-03 ENCOUNTER — Telehealth: Payer: Self-pay | Admitting: Cardiology

## 2009-11-09 ENCOUNTER — Ambulatory Visit: Payer: Self-pay

## 2009-11-09 ENCOUNTER — Ambulatory Visit (HOSPITAL_COMMUNITY): Admission: RE | Admit: 2009-11-09 | Discharge: 2009-11-09 | Payer: Self-pay | Admitting: Cardiology

## 2009-11-09 ENCOUNTER — Encounter: Payer: Self-pay | Admitting: Cardiology

## 2009-11-09 ENCOUNTER — Ambulatory Visit: Payer: Self-pay | Admitting: Family Medicine

## 2009-11-09 ENCOUNTER — Ambulatory Visit: Payer: Self-pay | Admitting: Internal Medicine

## 2009-11-17 ENCOUNTER — Ambulatory Visit: Payer: Self-pay | Admitting: Internal Medicine

## 2009-11-20 ENCOUNTER — Telehealth: Payer: Self-pay | Admitting: Family Medicine

## 2009-12-15 ENCOUNTER — Ambulatory Visit: Payer: Self-pay | Admitting: Family Medicine

## 2009-12-29 ENCOUNTER — Ambulatory Visit: Payer: Self-pay | Admitting: Family Medicine

## 2009-12-29 DIAGNOSIS — K5732 Diverticulitis of large intestine without perforation or abscess without bleeding: Secondary | ICD-10-CM

## 2009-12-29 LAB — CONVERTED CEMR LAB
Casts: 0 /lpf
Ketones, urine, test strip: NEGATIVE
Nitrite: NEGATIVE
Protein, U semiquant: 30
RBC / HPF: 0

## 2010-01-02 ENCOUNTER — Ambulatory Visit: Payer: Self-pay | Admitting: Family Medicine

## 2010-01-10 ENCOUNTER — Encounter: Payer: Self-pay | Admitting: Cardiology

## 2010-01-16 ENCOUNTER — Encounter: Payer: Self-pay | Admitting: Cardiology

## 2010-01-16 ENCOUNTER — Ambulatory Visit: Payer: Self-pay | Admitting: Family Medicine

## 2010-01-16 DIAGNOSIS — R109 Unspecified abdominal pain: Secondary | ICD-10-CM

## 2010-01-18 LAB — CONVERTED CEMR LAB
Albumin: 4 g/dL (ref 3.5–5.2)
Alkaline Phosphatase: 85 units/L (ref 39–117)
Lipase: 40 units/L (ref 11.0–59.0)

## 2010-01-22 ENCOUNTER — Encounter (INDEPENDENT_AMBULATORY_CARE_PROVIDER_SITE_OTHER): Payer: Self-pay | Admitting: *Deleted

## 2010-01-22 ENCOUNTER — Encounter: Payer: Self-pay | Admitting: Cardiology

## 2010-01-26 ENCOUNTER — Ambulatory Visit: Payer: Self-pay | Admitting: Cardiology

## 2010-02-02 ENCOUNTER — Encounter: Payer: Self-pay | Admitting: Family Medicine

## 2010-02-02 ENCOUNTER — Ambulatory Visit (HOSPITAL_COMMUNITY): Admission: RE | Admit: 2010-02-02 | Discharge: 2010-02-02 | Payer: Self-pay | Admitting: Urology

## 2010-02-06 ENCOUNTER — Ambulatory Visit (HOSPITAL_COMMUNITY): Admission: RE | Admit: 2010-02-06 | Discharge: 2010-02-06 | Payer: Self-pay | Admitting: Urology

## 2010-02-21 ENCOUNTER — Ambulatory Visit: Payer: Self-pay | Admitting: Family Medicine

## 2010-02-26 ENCOUNTER — Telehealth: Payer: Self-pay | Admitting: Internal Medicine

## 2010-03-13 ENCOUNTER — Telehealth (INDEPENDENT_AMBULATORY_CARE_PROVIDER_SITE_OTHER): Payer: Self-pay | Admitting: *Deleted

## 2010-03-14 ENCOUNTER — Ambulatory Visit: Payer: Self-pay | Admitting: Family Medicine

## 2010-03-14 DIAGNOSIS — M109 Gout, unspecified: Secondary | ICD-10-CM | POA: Insufficient documentation

## 2010-03-14 LAB — CONVERTED CEMR LAB
Alkaline Phosphatase: 69 units/L (ref 39–117)
BUN: 35 mg/dL — ABNORMAL HIGH (ref 6–23)
Basophils Absolute: 0 10*3/uL (ref 0.0–0.1)
Bilirubin, Direct: 0.1 mg/dL (ref 0.0–0.3)
CO2: 23 meq/L (ref 19–32)
Chloride: 111 meq/L (ref 96–112)
Creatinine, Ser: 2.6 mg/dL — ABNORMAL HIGH (ref 0.4–1.5)
Hgb A1c MFr Bld: 8.3 % — ABNORMAL HIGH (ref 4.6–6.5)
LDL Cholesterol: 59 mg/dL (ref 0–99)
Lymphocytes Relative: 20.2 % (ref 12.0–46.0)
Monocytes Relative: 11.9 % (ref 3.0–12.0)
PSA: 0.78 ng/mL (ref 0.10–4.00)
Platelets: 186 10*3/uL (ref 150.0–400.0)
RDW: 15.6 % — ABNORMAL HIGH (ref 11.5–14.6)
Testosterone: 198.11 ng/dL — ABNORMAL LOW (ref 350.00–890.00)
Total CHOL/HDL Ratio: 4
Total Protein: 6.9 g/dL (ref 6.0–8.3)
Uric Acid, Serum: 7.6 mg/dL (ref 4.0–7.8)

## 2010-03-16 ENCOUNTER — Encounter: Admission: RE | Admit: 2010-03-16 | Discharge: 2010-03-16 | Payer: Self-pay | Admitting: Surgery

## 2010-03-23 ENCOUNTER — Ambulatory Visit: Payer: Self-pay | Admitting: Family Medicine

## 2010-03-26 ENCOUNTER — Encounter: Payer: Self-pay | Admitting: Internal Medicine

## 2010-03-26 ENCOUNTER — Ambulatory Visit: Payer: Self-pay | Admitting: Internal Medicine

## 2010-03-27 ENCOUNTER — Telehealth: Payer: Self-pay | Admitting: Family Medicine

## 2010-03-28 ENCOUNTER — Telehealth: Payer: Self-pay | Admitting: Internal Medicine

## 2010-04-03 ENCOUNTER — Ambulatory Visit: Payer: Self-pay | Admitting: Internal Medicine

## 2010-04-04 LAB — CONVERTED CEMR LAB
BUN: 28 mg/dL — ABNORMAL HIGH (ref 6–23)
Calcium: 8.6 mg/dL (ref 8.4–10.5)
Eosinophils Relative: 3.8 % (ref 0.0–5.0)
GFR calc non Af Amer: 26.51 mL/min (ref >60)
HCT: 40 % (ref 39.0–52.0)
INR: 1 (ref 0.8–1.0)
Lymphocytes Relative: 17.2 % (ref 12.0–46.0)
Lymphs Abs: 1 10*3/uL (ref 0.7–4.0)
Monocytes Relative: 14.3 % — ABNORMAL HIGH (ref 3.0–12.0)
Platelets: 196 10*3/uL (ref 150.0–400.0)
Potassium: 5.3 meq/L — ABNORMAL HIGH (ref 3.5–5.1)
Prothrombin Time: 11 s (ref 9.7–11.8)
Sodium: 139 meq/L (ref 135–145)
WBC: 6 10*3/uL (ref 4.5–10.5)
aPTT: 25.2 s (ref 21.7–28.8)

## 2010-04-10 ENCOUNTER — Ambulatory Visit (HOSPITAL_COMMUNITY): Admission: RE | Admit: 2010-04-10 | Discharge: 2010-04-11 | Payer: Self-pay | Admitting: Internal Medicine

## 2010-04-10 ENCOUNTER — Ambulatory Visit: Payer: Self-pay | Admitting: Internal Medicine

## 2010-04-12 ENCOUNTER — Ambulatory Visit: Payer: Self-pay | Admitting: Internal Medicine

## 2010-04-18 ENCOUNTER — Ambulatory Visit: Payer: Self-pay | Admitting: Cardiology

## 2010-04-18 ENCOUNTER — Encounter: Payer: Self-pay | Admitting: Internal Medicine

## 2010-04-18 ENCOUNTER — Ambulatory Visit: Payer: Self-pay

## 2010-04-19 ENCOUNTER — Telehealth: Payer: Self-pay | Admitting: Cardiology

## 2010-04-23 ENCOUNTER — Telehealth: Payer: Self-pay | Admitting: Family Medicine

## 2010-04-24 ENCOUNTER — Telehealth: Payer: Self-pay | Admitting: Family Medicine

## 2010-04-24 ENCOUNTER — Ambulatory Visit: Payer: Self-pay | Admitting: Family Medicine

## 2010-04-30 ENCOUNTER — Emergency Department (HOSPITAL_COMMUNITY): Admission: EM | Admit: 2010-04-30 | Discharge: 2010-04-30 | Payer: Self-pay | Admitting: Emergency Medicine

## 2010-04-30 ENCOUNTER — Ambulatory Visit: Payer: Self-pay | Admitting: Family Medicine

## 2010-04-30 ENCOUNTER — Ambulatory Visit: Payer: Self-pay | Admitting: Cardiology

## 2010-04-30 DIAGNOSIS — K921 Melena: Secondary | ICD-10-CM

## 2010-04-30 DIAGNOSIS — R61 Generalized hyperhidrosis: Secondary | ICD-10-CM | POA: Insufficient documentation

## 2010-05-01 ENCOUNTER — Encounter: Payer: Self-pay | Admitting: Family Medicine

## 2010-05-02 ENCOUNTER — Ambulatory Visit: Payer: Self-pay | Admitting: Family Medicine

## 2010-05-17 ENCOUNTER — Ambulatory Visit: Payer: Self-pay | Admitting: Cardiology

## 2010-05-24 ENCOUNTER — Ambulatory Visit: Payer: Self-pay | Admitting: Family Medicine

## 2010-05-31 ENCOUNTER — Encounter: Payer: Self-pay | Admitting: Family Medicine

## 2010-06-04 ENCOUNTER — Encounter
Admission: RE | Admit: 2010-06-04 | Discharge: 2010-06-04 | Payer: Self-pay | Source: Home / Self Care | Attending: Nephrology | Admitting: Nephrology

## 2010-06-21 ENCOUNTER — Ambulatory Visit
Admission: RE | Admit: 2010-06-21 | Discharge: 2010-06-21 | Payer: Self-pay | Source: Home / Self Care | Attending: Family Medicine | Admitting: Family Medicine

## 2010-07-08 ENCOUNTER — Encounter: Payer: Self-pay | Admitting: Surgery

## 2010-07-17 ENCOUNTER — Encounter: Payer: Self-pay | Admitting: Internal Medicine

## 2010-07-17 ENCOUNTER — Ambulatory Visit
Admission: RE | Admit: 2010-07-17 | Discharge: 2010-07-17 | Payer: Self-pay | Source: Home / Self Care | Attending: Internal Medicine | Admitting: Internal Medicine

## 2010-07-17 DIAGNOSIS — Z95810 Presence of automatic (implantable) cardiac defibrillator: Secondary | ICD-10-CM | POA: Insufficient documentation

## 2010-07-19 NOTE — Assessment & Plan Note (Signed)
Summary: [name]   Visit Type:  Follow-up Primary Provider:  Joycelyn Man  CC:  CAD and Cardiomyopathy.  History of Present Illness: The patient presents for evaluation of his known coronary disease and cardiomyopathy. Since the last visit he did have an echocardiogram demonstrating his ejection fraction was still about 25%. I sent him to Dr. Ladona Ridgel who discussed BiVICD imlant.  He is considering this but is currently undergoing a urologic evaluation and wants to defer until this is complete. He does occasionally get short of breath with moderate activity but is not describing resting shortness of breath, PND or orthopnea. He is not describing chest pressure, neck or arm discomfort. He is not describing palpitations, presyncope or syncope. He wears his compression stockings. At the last appointment I increased his carvedilol to 9.375 mg b.i.d. and he tolerated this.  Current Medications (verified): 1)  Delatestryl 200 Mg/ml Oil (Testosterone Enanthate) .... 200mg  Im Monthly. 2)  IT consultant  Strp (Glucose Blood) .... Check Daily  Diagnosis Code 250.00 3)  Tylenol .... As Needed 4)  Lantus For Opticlik 100 Unit/ml Soln (Insulin Glargine) .... 20 Units Subcutaneously Once A Day. 5)  Carvedilol 6.25 Mg Tabs (Carvedilol) .... Take 1 and 1/2 Tablets Twice A Day 6)  Cvs Alcohol Swabs  Pads (Alcohol Swabs) .... As Needed 7)  Colchicine 0.6 Mg Tabs (Colchicine) .... As Needed 8)  Nitrostat 0.4 Mg Subl (Nitroglycerin) .... As Needed 9)  Aspirin 81 Mg Tbec (Aspirin) .... Take One By Mouth Daily 10)  Glucosamine Chondr 1500 Complx  Caps (Glucosamine-Chondroit-Vit C-Mn) .... Take One By Mouth Two Times A Day 11)  Vitamin D (Ergocalciferol) 50000 Unit Caps (Ergocalciferol) .[name].. 1 By Mouth Weekly 12)  Pravastatin Sodium 40 Mg Tabs (Pravastatin Sodium) .... One At Bedtime 13)  Bd Pen Needle Mini U/f 31g X 5 Mm Misc (Insulin Pen Needle) .... Use As Directed 14)  Cipro 750 Mg Tabs (Ciprofloxacin  Hcl) .[name].. 1 By Mouth Two Times A Day X14 Days 15)  Flagyl 500 Mg Tabs (Metronidazole) .[name].. 1 By Mouth Three Times A Day For 14 Days  Allergies (verified): 1)  * Vioxx (Rofecoxib) 2)  Celebrex (Celecoxib) 3)  * Crestor  Past History:  Past Medical History: Reviewed history from 10/24/2009 and no changes required. Hypertension (06/17/1981) Diverticulosis, colon (06/18/1983) Hyperlipidemia (06/17/1993) Diabetes mellitus, type II (06/17/1997) Renal Insufficiency Peptic Ulcer Disease 1960's CT Abd fatty process unchgd  myelolipoma adrenal unchgd 06/1997 CT Abd Adrenal mass unchgd no further f/u reqd 02/99 HOSP SBO 08/2000 Colonoscopy 4mm Sigmoid Polyp Diverticuosis (Dr Leone Payor) 12/29/2007   No repeat HOSP CP Pancreatitis  1/24-1/27/2010 EGD Schatkzki Ring  2cm HH  (Dr Arlyce Dice) 07/12/2008 ABD CT  fatty liver early pancreatitis  07/12/2008  Pelvic CT Nml 07/12/2008 Abd U/S Fatty infiltr liver complex cyst in lower pole left kidney 07/12/2008 HOSP/CATH (08/17/08) Sev 3 Vess CAD Isch  cardiomyopathy EF 25% Ant Akinesis Global Hypo 3/2-08/18/08  Past Surgical History: Reviewed history from 01/26/2009 and no changes required. CABG (eft internal mammaryartery graft to the left anterior descending coronary artery, with a saphenous vein graft to the first diagonal branch of the LAD, a saphenous vein graft to the obtuse marginal branch of the leftcircumflex coronary artery, and a saphenous vein graft to the posterior descending branch of the right coronary artery.  Dr, Laneta Simmers 09/05/2008) Choleycystectomy  1970's Bowel obstruction, small mid 80's (laparotomy), 09/12/2000 (no surgery), 10/20/2008 (no surgery) Lesion removal RLE 11/14/2006 (Dr Lindie Spruce)  Partial left Nephrectomy Clear Cell  Renal Cell Carcinoma 01/09/2009  Review of Systems       Abdomonal discomfort.  Otherwise as stated in the HPI and negative for all other systems.  Vital Signs:  Patient profile:   77 year old male Height:      72  inches Weight:      222 pounds BMI:     30.22 Pulse rate:   85 / minute Resp:     18 per minute BP sitting:   112 / 72  (right arm)  Vitals Entered By: Marrion Coy, CNA (January 26, 2010 10:42 AM)  Physical Exam  General:  Well developed, well nourished, in no acute distress. Head:  normocephalic and atraumatic Eyes:  PERRLA/EOM intact; conjunctiva and lids normal. Neck:  Neck supple, no JVD. No masses, thyromegaly or abnormal cervical nodes. Chest Wall:  well-healed sternotomy scar Lungs:  Clear bilaterally to auscultation and percussion. Abdomen:  Bowel sounds positive; abdomen soft and non-tender without masses, organomegaly, or hernias noted. No hepatosplenomegaly, obese Msk:  Back normal, normal gait. Muscle strength and tone normal. Extremities:  mild bilateral lower extremity edema Neurologic:  Alert and oriented x 3. Skin:  Intact without lesions or rashes. Cervical Nodes:  no significant adenopathy Psych:  Normal affect.   Detailed Cardiovascular Exam  Neck    Carotids: Carotids full and equal bilaterally without bruits.      Neck Veins: Normal, no JVD.    Heart    Inspection: no deformities or lifts noted.      Palpation: normal PMI with no thrills palpable.      Auscultation: regular rate and rhythm, S1, S2 without murmurs, rubs, gallops, or clicks.    Vascular    Abdominal Aorta: no palpable masses, pulsations, or audible bruits.      Femoral Pulses: normal femoral pulses bilaterally.      Pedal Pulses: R and L posterior tibial pulses are full and equal bilaterally     Radial Pulses: normal radial pulses bilaterally.      Peripheral Circulation: no clubbing, cyanosis, normal capillary refill.     EKG  Procedure date:  01/26/2010  Findings:      Sinus rhythm, , left bundle branch block, right axis deviation  Impression & Recommendations:  Problem # 1:  CHRONIC SYSTOLIC HEART FAILURE (ICD-428.22) Today I will titrate his carvedilol to 12.5 mg b.i.d. I  am avoiding ACE inhibitors for now with his renal insufficiency. On titrating meds slowly with his borderline blood pressure. We had a long discussion about the risks benefits of ICD/CRT. He thinks he wants to proceed with this after he completes his most recent urologic evaluation.  Problem # 2:  CAD SEVERE (ICD-414.00) He has no acute symptoms and we will continue to manage this medically. Orders: EKG w/ Interpretation (93000)  Problem # 3:  HYPERLIPIDEMIA (ICD-272.4) I will follow his lipid profiles going forward with a goal LDL less than 70 and HDL greater than 40.  Patient Instructions: 1)  Your physician recommends that you schedule a follow-up appointment in: 3 months with Dr Antoine Poche 2)  Your physician has recommended you make the following change in your medication: Increase Carvedilol to 12.5 mg one twice a day Prescriptions: CARVEDILOL 12.5 MG TABS (CARVEDILOL) one twice a day  #60 x 6   Entered by:   Charolotte Capuchin, RN   Authorized by:   [name], MD, [facility]   Signed by:   Charolotte Capuchin, RN on 01/26/2010   Method used:   [facility]        [facility]  [address] [facility]       [address]       [place], [place]  [address]       Ph: [phone] or [phone]       Fax: [phone]   RxID:   [id number]  I have reviewed and approved all prescriptions at the time of this visit. [name], MD, [facility]  January 26, 2010 11:39 AM

## 2010-07-19 NOTE — Letter (Signed)
Summary: [facility]  [facility]   Imported By: Maryln Gottron 06/20/2010 10:31:20  _____________________________________________________________________  External Attachment:    Type:   Image     Comment:   External Document

## 2010-07-19 NOTE — Assessment & Plan Note (Signed)
Summary: RECTAL BLEEDING/DLO   Vital Signs:  Patient profile:   77 year old male Height:      72 inches Weight:      219 pounds BMI:     29.81 O2 Sat:      95 % on Room air Temp:     97.6 degrees F oral Pulse rate:   63 / minute Pulse rhythm:   regular BP sitting:   130 / 80  (left arm) Cuff size:   large  Vitals Entered By: Lowella Petties CMA, AAMA (April 30, 2010 11:30 AM)  O2 Flow:  Room air CC: Rectal bleeding, dizziness, headache   Serial Vital Signs/Assessments:  Time      Position  BP       Pulse  Resp  Temp     By 11:52 AM            110/70                         Lugene Fuquay CMA (AAMA)   History of Present Illness: Pt with h/o rectal bleeding but no syncope over the weekend.  This was resolving and had normal BM in interval.  This AM was driving and had episode where he had to pull over.  Was sweating with neck tightness.  No CP.  Felt dizzy.  Wasn't short of breath.  Had held ASA due to recent blood in stool.  Came in early for appointment today, was scheduled for later this PM but came in this AM as walk in.  Sx had resolved by time of exam.   Recent ICD placed.   Allergies: 1)  ! Nsaids 2)  * Vioxx (Rofecoxib) 3)  * Crestor 4)  Celebrex (Celecoxib)  Past History:  Past Medical History: Last updated: 04/18/2010 Hypertension (06/17/1981) Diverticulosis, colon (06/18/1983) Hyperlipidemia (06/17/1993) Diabetes mellitus, type II (06/17/1997) Renal Insufficiency- Dr. Allena Katz with renal Peptic Ulcer Disease 1960's CT Abd fatty process unchgd  myelolipoma adrenal unchgd 06/1997 CT Abd Adrenal mass unchgd no further f/u reqd 02/99 HOSP SBO 08/2000 Colonoscopy 4mm Sigmoid Polyp Diverticuosis (Dr Leone Payor) 12/29/2007   No repeat HOSP CP Pancreatitis  1/24-1/27/2010 EGD Schatkzki Ring  2cm HH  (Dr Arlyce Dice) 07/12/2008 ABD CT  fatty liver early pancreatitis  07/12/2008  Pelvic CT Nml 07/12/2008 Abd U/S Fatty infiltr liver complex cyst in lower pole left kidney  07/12/2008 HOSP/CATH (08/17/08) Sev 3 Vess CAD Isch  cardiomyopathy EF 25% Ant Akinesis Global Hypo 3/2-08/18/08 Thyroid nodule per Dr. Luisa Hart L renal CA s/p partial nephrectomy 2010 per Dr. Laverle Patter ICD/CRT (LV lead disabled)  ([employer] [id number])  Past Surgical History: Last updated: 01/26/2009 CABG (eft internal mammaryartery graft to the left anterior descending coronary artery, with a saphenous vein graft to the first diagonal branch of the LAD, a saphenous vein graft to the obtuse marginal branch of the leftcircumflex coronary artery, and a saphenous vein graft to the posterior descending branch of the right coronary artery.  Dr, Laneta Simmers 09/05/2008) Choleycystectomy  1970's Bowel obstruction, small mid 80's (laparotomy), 09/12/2000 (no surgery), 10/20/2008 (no surgery) Lesion removal RLE 11/14/2006 (Dr Lindie Spruce)  Partial left Nephrectomy Clear Cell Renal Cell Carcinoma 01/09/2009  Review of Systems       See HPI.  Otherwise negative.    Physical Exam  General:  Clothes sweaty but not diaphoretic by time of exam.  NAD, A&O MMM RRR CTAB abdominal exam s/o tenderness.    Impression & Recommendations:  Problem #  1:  DIAPHORESIS (ICD-780.8) Options d/w patient.  I called cards FI:[id number] admission and d/w 911 dispatch/EMS on arrival.  Pt placed on O2 but not given NTG/ASA due to resolution of symptoms and recently bleed/current BP.  EKG copied for EMS.  Findings c/w pacer spikes noted.  Given CAD hx, I would prefer EMS transport to [facility] for cards eval.  He understood as did wife.  Cards is aware of pending arrival as patient was leaving office.  Appreciate help of all involved.  Orders: EKG w/ Interpretation (93000)  Problem # 2:  BLOOD IN STOOL (ICD-578.1) Can discuss with patient after #1 fully evaluated.   Complete Medication List: 1)  Delatestryl 200 Mg/ml Oil (Testosterone enanthate) .... 250mg  im monthly. 2)  IT consultant Strp (Glucose blood) .... Check daily  diagnosis code  250.00 3)  Tylenol  .... As needed (2-3 daily) 4)  Lantus For Opticlik 100 Unit/ml Soln (Insulin glargine) .... 20 units subcutaneously once a day. 5)  Cvs Alcohol Swabs Pads (Alcohol swabs) .... As needed 6)  Nitrostat 0.4 Mg Subl (Nitroglycerin) .... As needed (none taken in last year) 7)  Aspirin 81 Mg Tbec (Aspirin) .... Take one by mouth daily 8)  Glucosamine Chondr 1500 Complx Caps (Glucosamine-chondroit-vit c-mn) .... Take one by mouth two times a day 9)  Pravastatin Sodium 40 Mg Tabs (Pravastatin sodium) .... One at bedtime 10)  Bd Pen Needle Mini U/f 31g X 5 Mm Misc (Insulin pen needle) .... Use as directed 11)  Glucerna Shake Liqd (Nutritional supplements) .... 4 oz. per day with breakfast 12)  Vitamin D 1000 Unit Caps (Cholecalciferol) .... One by mouth daily 13)  Carvedilol 6.25 Mg Tabs (Carvedilol) .... One and 1/2 tablet twice a day  Patient Instructions: 1)  To [facility] via EMS.    Orders Added: 1)  Est. Patient Level IV [88416] 2)  EKG w/ Interpretation [93000]    Current Allergies (reviewed today): ! NSAIDS * VIOXX (ROFECOXIB) * CRESTOR CELEBREX (CELECOXIB)

## 2010-07-19 NOTE — Progress Notes (Signed)
Summary: results of ekg  Phone Note Call from Patient   Caller: Patient [phone] Reason for Call: Talk to Nurse, Lab or Test Results Summary of Call: pt calling re ekg results Initial call taken by: Glynda Jaeger,  March 28, 2010 10:56 AM  Follow-up for Phone Call        03/28/10--1130am--pt calling with ? about his EKG and what the interpretation was--sched for defribrillator placement  end of month --advised the EKG showed sinus rhythm with 1 degree block with rate of 81--appears to be OK for procedure--nt Follow-up by: Ledon Snare, RN,  March 28, 2010 11:23 AM

## 2010-07-19 NOTE — Progress Notes (Signed)
Summary: start pravastatin/lab 10 weeks  ---- Converted from flag ---- ---- 10/24/2009 6:22 PM, [name], MD, [facility] wrote: Start pravastatin 40 mg and follow up lipid in 10 weeks. -----Pt aware and [facility].  Pt aware he needs fasting lipid and liver in 10 weeks        New/Updated Medications: PRAVASTATIN SODIUM 40 MG TABS (PRAVASTATIN SODIUM) one at bedtime Prescriptions: PRAVASTATIN SODIUM 40 MG TABS (PRAVASTATIN SODIUM) one at bedtime  #30 x 11   Entered by:   Charolotte Capuchin, RN   Authorized by:   [name], MD, [facility]   Signed by:   Charolotte Capuchin, RN on 11/03/2009   Method used:   [facility]        [facility]  [address] (retail)       [address]       [place], [place]  [address]       Ph: [phone] or [phone]       Fax: [phone]   RxID:   [id number]

## 2010-07-19 NOTE — Progress Notes (Signed)
----   Converted from flag ---- ---- 03/13/2010 2:05 PM, Crawford Givens MD wrote: cmet/lipid/A1c 250.00 uric acid 274.0 testosterone/PSA/cbc 257.2 vit D 268.9  ---- 03/13/2010 7:52 AM, Liane Comber CMA (AAMA) wrote: Lab orders please! Good Morning! This pt is scheduled for cpx labs tomorrow, which labs to draw and dx codes to use? Thanks Tasha ------------------------------

## 2010-07-19 NOTE — Letter (Signed)
Summary: Nadara Eaton letter  [place] at [facility]  [address] [place], [place] [address]   Phone: [phone]  Fax: [phone]       01/22/2010 MRN: [id number]  AMOGH KOMATSU [address] [place], [place]  [address]  Dear Mr. Levonne Spiller [facility] [place], and [place] announce the retirement of Arta Silence, M.D., from full-time practice at the [facility] office effective December 14, 2009 and his plans of returning part-time.  It is important to Dr. Hetty Ely and to our practice that you understand that [facility] has seven physicians in our office for your health care needs.  We will continue to offer the same exceptional care that you have today.    Dr. Hetty Ely has spoken to many of you about his plans for retirement and returning part-time in the fall.   We will continue to work with you through the transition to schedule appointments for you in the office and meet the high standards that [facility] is committed to.   Again, it is with great pleasure that we share the news that Dr. Hetty Ely will return to [facility] in October of 2011 with a reduced schedule.    If you have any questions, or would like to request an appointment with one of our physicians, please call us at [phone] and press the option for Scheduling an appointment.  We take pleasure in providing you with excellent patient care and look forward to seeing you at your next office visit.  Our [facility] Physicians are:  Tillman Abide, M.D. Laurita Quint, M.D. Roxy Manns, M.D. Kerby Nora, M.D. Hannah Beat, M.D. Ruthe Mannan, M.D. We proudly welcomed Raechel Ache, M.D. and Eustaquio Boyden, M.D. to the practice in July/August 2011.  Sincerely,  [place] [facility]

## 2010-07-19 NOTE — Progress Notes (Signed)
Summary: requests pneumovax  Phone Note Call from Patient Call back at Home Phone [phone]   Caller: Patient Call For: Crawford Givens MD Summary of Call: Pt wants to get pneumovax when he comes in for his testosterone injection. OK to order? Initial call taken by: Lowella Petties CMA, AAMA,  April 23, 2010 3:54 PM  Follow-up for Phone Call        He had pneumovax after age 77.  He doesn't need another dose.  Follow-up by: Crawford Givens MD,  April 23, 2010 5:23 PM  Additional Follow-up for Phone Call Additional follow up Details #1::        Patient Advised.  Additional Follow-up by: Delilah Shan CMA Frankie Zito Dull),  April 24, 2010 11:39 AM

## 2010-07-19 NOTE — Assessment & Plan Note (Signed)
Summary: INJECTION/DLO  Nurse Visit   Allergies: 1)  ! Nsaids 2)  * Vioxx (Rofecoxib) 3)  * Crestor 4)  Celebrex (Celecoxib)  Medication Administration  Injection # 1:    Medication: Testosterone Cypionat 200mg  ing    Diagnosis: TESTOSTERONE DEFICIENCY (ICD-257.2)    Route: IM    Site: LUOQ gluteus    Exp Date: 01/16/2012    Lot #: [id number]    Mfr: [employer]    Comments: Pt received 250mg  IM.    Patient tolerated injection without complications    Given by: Lewanda Rife LPN (May 24, 2010 3:58 PM)  Orders Added: 1)  Testosterone Cypionat 200mg  ing [J1080] 2)  Admin of patients own med IM/SQ [96372M] Pt brought his own medication.Lewanda Rife LPN  May 24, 2010 4:01 PM  Prior Medications: DELATESTRYL 200 MG/ML OIL (TESTOSTERONE ENANTHATE) 250mg  IM monthly. BAYER CONTOUR TEST  STRP (GLUCOSE BLOOD) check daily  diagnosis code 250.00 TYLENOL () as needed (2-3 daily) LANTUS FOR OPTICLIK 100 UNIT/ML SOLN (INSULIN GLARGINE) 20 units Subcutaneously once a day. CVS ALCOHOL SWABS  PADS (ALCOHOL SWABS) as needed NITROSTAT 0.4 MG SUBL (NITROGLYCERIN) as needed (None taken in last year) ASPIRIN 81 MG TBEC (ASPIRIN) Take one by mouth daily GLUCOSAMINE CHONDR 1500 COMPLX  CAPS (GLUCOSAMINE-CHONDROIT-VIT C-MN) Take one by mouth two times a day PRAVASTATIN SODIUM 40 MG TABS (PRAVASTATIN SODIUM) one at bedtime BD PEN NEEDLE MINI U/F 31G X 5 MM MISC (INSULIN PEN NEEDLE) Use as directed GLUCERNA SHAKE  LIQD (NUTRITIONAL SUPPLEMENTS) 4 oz. per day with breakfast VITAMIN D 1000 UNIT CAPS (CHOLECALCIFEROL) one by mouth daily CARVEDILOL 6.25 MG TABS (CARVEDILOL) one  tablet twice a day AUGMENTIN 500-125 MG TABS (AMOXICILLIN-POT CLAVULANATE) 1 by mouth two times a day Current Allergies: ! NSAIDS * VIOXX (ROFECOXIB) * CRESTOR CELEBREX (CELECOXIB)

## 2010-07-19 NOTE — Letter (Signed)
Summary: Implantable Device Instructions  Architectural technologist, Main Office  [address]   [place], [place] [address]   Phone: [phone]  Fax: [phone]      Implantable Device Instructions  You are scheduled for:  Bi-V ICD Implant  on 04/10/10 with Dr. Ladona Ridgel.  1.  Please arrive at the Short Stay Center at [facility] at 5:30am on the day of your procedure.  2.  Do not eat or drink after midnight the night before your procedure.  3.  Complete lab work on 04/03/10 at 11:00am.  The lab at [facility] is open from 8:30 AM to 1:30 PM and from 2:30 PM to 5:00 PM.  You do not have to be fasting.  4.  Do NOT take these medications for the morning of your procedure:  1/2 am Insulin dose  5.  Plan for an overnight stay.  Bring your insurance cards and a list of your medications.  6.  Wash your chest and neck with antibacterial soap (any brand) the evening before and the morning of your procedure.  Rinse well.  7.  Education material received:     Bi- V ICD   *If you have ANY questions after you get home, please call the office [phone].  Anselm Pancoast  *Every attempt is made to prevent procedures from being rescheduled.  Due to the nauture of Electrophysiology, rescheduling can happen.  The physician is always aware and directs the staff when this occurs.

## 2010-07-19 NOTE — Cardiovascular Report (Signed)
Summary: Pre Op Orders   Pre Op Orders   Imported By: Roderic Ovens 04/13/2010 13:09:42  _____________________________________________________________________  External Attachment:    Type:   Image     Comment:   External Document

## 2010-07-19 NOTE — Letter (Signed)
Summary: Dr.Jay Patel,[facility],Note  Dr.Jay Patel,[facility],Note   Imported By: Beau Fanny 11/02/2009 08:40:28  _____________________________________________________________________  External Attachment:    Type:   Image     Comment:   External Document

## 2010-07-19 NOTE — Assessment & Plan Note (Signed)
Summary: shot??  Nurse Visit   Allergies: 1)  * Vioxx (Rofecoxib) 2)  Celebrex (Celecoxib) 3)  * Crestor  Medication Administration  Injection # 1:    Medication: Testosterone Cypionat 200mg  ing    Diagnosis: TESTOSTERONE DEFICIENCY (ICD-257.2)    Route: IM    Site: RUOQ gluteus    Exp Date: 10/15/2009    Lot #: [id number]    Mfr: Gaylyn Rong    Patient tolerated injection without complications    Given by: Linde Gillis CMA Duncan Dull) (August 03, 2009 11:58 AM)  Orders Added: 1)  Admin of Therapeutic Inj  intramuscular or subcutaneous [96372] 2)  Testosterone Cypionat 200mg  ing [J1080]

## 2010-07-19 NOTE — Assessment & Plan Note (Signed)
Summary: eph/jml   Visit Type:  Follow-up Primary Provider:  Joycelyn Man  CC:  Presyncope.  History of Present Illness: The patient presents for followup after being seen recently in the emergency room. He had a presyncopal episode with nausea and vomiting. He was seen by Dr. Jens Som in consultation. The etiology was not clear though there were no acute findings and he was not admitted. I have tried to slightly below on his carvedilol prior to this. He is now back down to 6.25 b.i.d. which was retitration of dose. Since the emergency room visit on the 14th of last month he has had one minor episode of lightheadedness but no palpitations, presyncope or syncope. He had none of the nausea that he had previously. He had no chest pressure, neck or arm discomfort. He is no longer having stimulation of his diaphragm by his LV lead as this has been turned off.  Current Medications (verified): 1)  Delatestryl 200 Mg/ml Oil (Testosterone Enanthate) .... 250mg  Im Monthly. 2)  IT consultant  Strp (Glucose Blood) .... Check Daily  Diagnosis Code 250.00 3)  Tylenol .... As Needed (2-3 Daily) 4)  Lantus For Opticlik 100 Unit/ml Soln (Insulin Glargine) .... 20 Units Subcutaneously Once A Day. 5)  Cvs Alcohol Swabs  Pads (Alcohol Swabs) .... As Needed 6)  Nitrostat 0.4 Mg Subl (Nitroglycerin) .... As Needed (None Taken in Last Year) 7)  Aspirin 81 Mg Tbec (Aspirin) .... Take One By Mouth Daily 8)  Glucosamine Chondr 1500 Complx  Caps (Glucosamine-Chondroit-Vit C-Mn) .... Take One By Mouth Two Times A Day 9)  Pravastatin Sodium 40 Mg Tabs (Pravastatin Sodium) .... One At Bedtime 10)  Bd Pen Needle Mini U/f 31g X 5 Mm Misc (Insulin Pen Needle) .... Use As Directed 11)  Glucerna Shake  Liqd (Nutritional Supplements) .... 4 Oz. Per Day With Breakfast 12)  Vitamin D 1000 Unit Caps (Cholecalciferol) .... One By Mouth Daily 13)  Carvedilol 6.25 Mg Tabs (Carvedilol) .... One  Tablet Twice A Day 14)   Augmentin 500-125 Mg Tabs (Amoxicillin-Pot Clavulanate) .[name].. 1 By Mouth Two Times A Day  Allergies (verified): 1)  ! Nsaids 2)  * Vioxx (Rofecoxib) 3)  * Crestor 4)  Celebrex (Celecoxib)  Past History:  Past Medical History: Reviewed history from 04/18/2010 and no changes required. Hypertension (06/17/1981) Diverticulosis, colon (06/18/1983) Hyperlipidemia (06/17/1993) Diabetes mellitus, type II (06/17/1997) Renal Insufficiency- Dr. 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 Luisa Hart L renal CA s/p partial nephrectomy 2010 per Dr. Laverle Patter ICD/CRT (LV lead disabled)  (Medtronic [id number] H)  Past Surgical History: Reviewed history from 01/26/2009 and no changes required. CABG (eft internal mammaryartery graft to the left anterior descending coronary artery, with a saphenous vein graft to the first diagonal branch of the LAD, a saphenous vein graft to the obtuse marginal branch of the leftcircumflex coronary artery, and a saphenous vein graft to the posterior descending branch of the right coronary  artery.  Dr, Laneta Simmers 09/05/2008) Choleycystectomy  1970's Bowel obstruction, small mid 80's (laparotomy), 09/12/2000 (no surgery), 10/20/2008 (no surgery) Lesion removal RLE 11/14/2006 (Dr Lindie Spruce)  Partial left Nephrectomy Clear Cell Renal Cell Carcinoma 01/09/2009  Review of Systems       As stated in the HPI and negative for  all other systems.   Vital Signs:  Patient profile:   77 year old male Height:      72 inches Weight:      220 pounds BMI:     29.95 Pulse rate:   68 / minute Resp:     16 per minute BP sitting:   135 / 82  (right arm)  Vitals Entered By: Marrion Coy, CNA (May 17, 2010 11:28 AM)  Physical Exam  General:  Well developed, well nourished, in no acute distress. Head:  normocephalic and atraumatic Neck:  Neck supple, no JVD. No masses, thyromegaly or abnormal cervical nodes. Chest Wall:  Well-healed pacemaker pocket and sternotomy scar Lungs:  Clear bilaterally to auscultation and percussion. Abdomen:  Bowel sounds positive; abdomen soft and non-tender without masses, organomegaly, or hernias noted. No hepatosplenomegaly, obese Msk:  Back normal, normal gait. Muscle strength and tone normal. Extremities:  mild bilateral lower extremity edema Neurologic:  Alert and oriented x 3. Skin:  Intact without lesions or rashes. Cervical Nodes:  no significant adenopathy Psych:  Normal affect.   Detailed Cardiovascular Exam  Neck    Carotids: Carotids full and equal bilaterally without bruits.      Neck Veins: Normal, no JVD.    Heart    Inspection: no deformities or lifts noted.      Palpation: normal PMI with no thrills palpable.      Auscultation: regular rate and rhythm, S1, S2 without murmurs, rubs, gallops, or clicks.    Vascular    Abdominal Aorta: no palpable masses, pulsations, or audible bruits.      Femoral Pulses: normal femoral pulses bilaterally.      Pedal Pulses: R and L posterior tibial pulses are full and equal bilaterally     Radial Pulses: normal radial pulses bilaterally.      Peripheral Circulation: no clubbing, cyanosis, normal capillary refill.      ICD Specifications Following MD:  Lewayne Bunting, MD     ICD Vendor:  Medtronic     ICD Model Number:  [id number]     ICD Serial Number:  [id number] ICD DOI:  04/10/2010     ICD Implanting MD:  Lewayne Bunting,  MD  Lead 1:    Location: RA     DOI: 04/10/2010     Model #: [id number]     Serial #: [id number]     Status: active Lead 2:    Location: RV     DOI: 04/10/2010     Model #: [id number]     Serial #: [id number]     Status: active Lead 3:    Location: LV     DOI: 04/10/2010     Model #: [id number]     Serial #: [id number]     Status: active  Indications::  CM   Episodes Coumadin:  No  Brady Parameters Mode DDD     Lower Rate Limit:  50     Upper Rate Limit 130 PAV 200     Sensed AV Delay:  180  Tachy Zones VF:  200     VT:  171     Impression & Recommendations:  Problem #  1:  DIAPHORESIS (ICD-780.8) The patient had an episode of diaphoresis and presyncope that could have been vagal. He is no longer bothered by this. However, because of this I will not titrate his meds at this time.  Problem # 2:  ISCHEMIC CARDIOMYOPATHY  EF 25% (ICD-414.8) I have tried to titrate beta blocker without success. He was taken off of his ACE inhibitor because of renal insufficiency. In the future I will add hydralazine nitrates if his blood pressure allows.  Problem # 3:  RENAL DISEASE, CHRONIC, MILD (ICD-585.2) This is followed closely and has been stable.  Problem # 4:  CAD SEVERE (ICD-414.00) He will continue with risk reduction.  Patient Instructions: 1)  Your physician recommends that you schedule a follow-up appointment in: 3 months with Dr Antoine Poche 2)  Your physician recommends that you continue on your current medications as directed. Please refer to the Current Medication list given to you today.

## 2010-07-19 NOTE — Procedures (Signed)
Summary: icd check/medtronic   Current Medications (verified): 1)  Delatestryl 200 Mg/ml Oil (Testosterone Enanthate) .... 250mg  Im Monthly. 2)  IT consultant  Strp (Glucose Blood) .... Check Daily  Diagnosis Code 250.00 3)  Tylenol .... As Needed (2-3 Daily) 4)  Lantus For Opticlik 100 Unit/ml Soln (Insulin Glargine) .... 20 Units Subcutaneously Once A Day. 5)  Carvedilol 12.5 Mg Tabs (Carvedilol) .... 1/2  Twice A Day 6)  Cvs Alcohol Swabs  Pads (Alcohol Swabs) .... As Needed 7)  Nitrostat 0.4 Mg Subl (Nitroglycerin) .... As Needed (None Taken in Last Year) 8)  Aspirin 81 Mg Tbec (Aspirin) .... Take One By Mouth Daily 9)  Glucosamine Chondr 1500 Complx  Caps (Glucosamine-Chondroit-Vit C-Mn) .... Take One By Mouth Two Times A Day 10)  Pravastatin Sodium 40 Mg Tabs (Pravastatin Sodium) .... One At Bedtime 11)  Bd Pen Needle Mini U/f 31g X 5 Mm Misc (Insulin Pen Needle) .... Use As Directed 12)  Glucerna Shake  Liqd (Nutritional Supplements) .... 4 Oz. Per Day With Breakfast 13)  Vitamin D 1000 Unit Caps (Cholecalciferol) .... One By Mouth Daily  Allergies (verified): 1)  ! Nsaids 2)  * Vioxx (Rofecoxib) 3)  * Crestor 4)  Celebrex (Celecoxib)   ICD Specifications Following MD:  Lewayne Bunting, MD     ICD Vendor:  Medtronic     ICD Model Number:  [id number]     ICD Serial Number:  [id number] ICD DOI:  04/10/2010     ICD Implanting MD:  Lewayne Bunting, MD  Lead 1:    Location: RA     DOI: 04/10/2010     Model #: 6962     Serial #: [id number]     Status: active Lead 2:    Location: RV     DOI: 04/10/2010     Model #: [id number]     Serial #: [id number]     Status: active Lead 3:    Location: LV     DOI: 04/10/2010     Model #: [id number]     Serial #: [id number]     Status: active  Indications::  CM   ICD Follow Up Remote Check?  No Battery Voltage:  3.14 V     Charge Time:  8.5 seconds       ICD Device Measurements Atrium:  Amplitude: 2.1 mV, Impedance: 551 ohms, Threshold: 0.5 V at 0.4  msec Right Ventricle:  Amplitude: 13.5 mV, Impedance: 475 ohms, Threshold: 0.5 V at 0.4 msec Left Ventricle:  Impedance: 494 ohms, Threshold: 4.0 V at 1.5 msec Shock Impedance: 38/52 ohms   Episodes MS Episodes:  0     Percent Mode Switch:  0     Coumadin:  No Shock:  0     ATP:  0     Nonsustained:  0     Atrial Pacing:  0.2%     Ventricular Pacing:  100%  Brady Parameters Mode DDD     Lower Rate Limit:  50     Upper Rate Limit 130 PAV 200     Sensed AV Delay:  180  Tachy Zones VF:  200     VT:  171     Tech Comments:  Mr. Poch was seen today for diaphramatic stimulation.  Because of the high threshold today I turned the LV lead off per Dr. Ladona Ridgel and we will recheck it next week during his scheduled wound check appt.   Altha Harm, LPN  April 12, 2010 4:17 PM

## 2010-07-19 NOTE — Progress Notes (Signed)
Summary: Pt return call  Phone Note Call from Patient Call back at [facility] Phone [phone]   Caller: Patient Complaint: Urinary/GYN Problems Summary of Call: Pt return call Initial call taken by: Judie Grieve,  Nov 03, 2009 3:30 PM  Follow-up for Phone Call        returned call to pt.  Pt would like to know why Dr Antoine Poche wants to start Pravastatin, unable to find any results for a lipid profile.  Will follow up with Dr [place]  Follow-up by: Charolotte Capuchin, RN,  Nov 03, 2009 4:11 PM  Additional Follow-up for Phone Call Additional follow up Details #1::        All patients with CAD and without contraindiction should be on a statin.  I reviewed with chart and I know that he couldn't take Crestor but I could see no contrainidcation to statin. Additional Follow-up by: [name], MD, [facility],  Nov 06, 2009 6:42 PM    Additional Follow-up for Phone Call Additional follow up Details #2::    reviewed with pt Follow-up by: Marrion Coy, CNA,  Nov 09, 2009 1:05 PM

## 2010-07-19 NOTE — Letter (Signed)
Summary: [facility] Patient Note   [facility] Patient Note   Imported By: Roderic Ovens 02/20/2010 14:38:04  _____________________________________________________________________  External Attachment:    Type:   Image     Comment:   External Document

## 2010-07-19 NOTE — Assessment & Plan Note (Signed)
Summary: PER CHECK OUT/SF   Visit Type:  Follow-up Primary Provider:  Joycelyn Man  CC:  Cardiomyopathy.  History of Present Illness: The patient presents for followup after defibrillator placement. Unfortunately he developed diaphragmatic stimulation with the LV lead which is now turned off. He otherwise did well with this procedure. Of note he did not tolerate the increased dose of carvedilol to 12.5 mg b.i.d. He got leg cramping and fatigue. He got back to 6.25 b.i.d. He has had no new shortness of breath, PND or orthopnea. He has had no new palpitations, presyncope or syncope. He denies any chest pressure, neck or arm discomfort. He denies any weight gain or edema.   Current Medications (verified): 1)  Delatestryl 200 Mg/ml Oil (Testosterone Enanthate) .... 250mg  Im Monthly. 2)  IT consultant  Strp (Glucose Blood) .... Check Daily  Diagnosis Code 250.00 3)  Tylenol .... As Needed (2-3 Daily) 4)  Lantus For Opticlik 100 Unit/ml Soln (Insulin Glargine) .... 20 Units Subcutaneously Once A Day. 5)  Carvedilol 12.5 Mg Tabs (Carvedilol) .... 1/2  Twice A Day 6)  Cvs Alcohol Swabs  Pads (Alcohol Swabs) .... As Needed 7)  Nitrostat 0.4 Mg Subl (Nitroglycerin) .... As Needed (None Taken in Last Year) 8)  Aspirin 81 Mg Tbec (Aspirin) .... Take One By Mouth Daily 9)  Glucosamine Chondr 1500 Complx  Caps (Glucosamine-Chondroit-Vit C-Mn) .... Take One By Mouth Two Times A Day 10)  Pravastatin Sodium 40 Mg Tabs (Pravastatin Sodium) .... One At Bedtime 11)  Bd Pen Needle Mini U/f 31g X 5 Mm Misc (Insulin Pen Needle) .... Use As Directed 12)  Glucerna Shake  Liqd (Nutritional Supplements) .... 4 Oz. Per Day With Breakfast 13)  Vitamin D 1000 Unit Caps (Cholecalciferol) .... One By Mouth Daily  Allergies (verified): 1)  ! Nsaids 2)  * Vioxx (Rofecoxib) 3)  * Crestor 4)  Celebrex (Celecoxib)  Past History:  Past Medical History: Hypertension (06/17/1981) Diverticulosis, colon  (06/18/1983) Hyperlipidemia (06/17/1993) Diabetes mellitus, type II (06/17/1997) Renal Insufficiency- Dr. 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 Luisa Hart L renal CA s/p partial nephrectomy 2010 per Dr. Laverle Patter ICD/CRT (LV lead disabled)  (Medtronic [id number] H)  Review of Systems       As stated in the HPI and negative for all other systems.   Vital Signs:  Patient profile:   77 year old male Height:      72 inches Weight:      219 pounds BMI:     29.81 Pulse rate:   76 / minute Resp:     16 per minute BP sitting:   112 / 70  (right arm)  Vitals Entered By: Marrion Coy, CNA (April 18, 2010 11:01 AM)  Physical Exam  General:  Well developed, well nourished, in no acute distress. Head:  normocephalic and atraumatic Eyes:  PERRLA/EOM intact; conjunctiva and lids normal. Mouth:  Oral mucosa and oropharynx without lesions  or exudates.  Teeth in good repair. Neck:  Neck supple, no JVD. No masses, thyromegaly or abnormal cervical nodes. Chest Wall:  Well healed ICD scar Lungs:  Clear bilaterally to auscultation and percussion. Abdomen:  Bowel sounds positive; abdomen soft and non-tender without masses, organomegaly, or hernias noted. No hepatosplenomegaly, obese Msk:  Back normal, normal gait. Muscle strength and tone normal. Extremities:  mild bilateral lower extremity edema Neurologic:  Alert and oriented  x 3. Skin:  Intact without lesions or rashes. Cervical Nodes:  no significant adenopathy Psych:  Normal affect.   Detailed Cardiovascular Exam  Neck    Carotids: Carotids full and equal bilaterally without bruits.      Neck Veins: Normal, no JVD.    Heart    Inspection: no deformities or lifts noted.      Palpation: normal PMI with no thrills palpable.      Auscultation: regular rate and rhythm, S1, S2 without murmurs, rubs, gallops, or clicks.    Vascular    Abdominal Aorta: no palpable masses, pulsations, or audible bruits.      Femoral Pulses: normal femoral pulses bilaterally.      Pedal Pulses: R and L posterior tibial pulses are full and equal bilaterally     Radial Pulses: normal radial pulses bilaterally.      Peripheral Circulation: no clubbing, cyanosis, normal capillary refill.      ICD Specifications Following MD:  Lewayne Bunting, MD     ICD Vendor:  Medtronic     ICD Model Number:  [id number]     ICD Serial Number:  [id number] ICD DOI:  04/10/2010     ICD Implanting MD:  Lewayne Bunting, MD  Lead 1:    Location: RA     DOI: 04/10/2010     Model #: [id number]     Serial #: [id number]     Status: active Lead 2:    Location: RV     DOI: 04/10/2010     Model #: [id number]     Serial #: [id number]     Status: active Lead 3:    Location: LV     DOI: 04/10/2010     Model #: 2725     Serial #: [id number]     Status: active  Indications::  CM   Episodes Coumadin:  No  Brady Parameters Mode DDD     Lower Rate Limit:  50     Upper Rate Limit 130 PAV 200     Sensed AV Delay:  180  Tachy Zones VF:  200     VT:  171     Impression & Recommendations:  Problem # 1:  CHRONIC SYSTOLIC HEART FAILURE (ICD-428.22) I will again try to gently titrate his carvedilol to 9.375 mg b.i.d. He will followup Dr. Ladona Ridgel to consider further ingestants that his device.  We are avoiding ACE inhibitors secondary to RI.  Problem # 2:  OBESITY (ICD-278.00) He understands the need to lose weight with diet and  exercise.  Patient Instructions: 1)  Your physician recommends that you schedule a follow-up appointment in: 05/2010 with Dr Antoine Poche 2)  Your physician has recommended you make the following change in your medication: Increase carvedilol to 6.25 mg one and 1/2 tablet twice a day 3)  You have been diagnosed with Congestive Heart Failure or CHF.  CHF is a condition in which a problem with the structure or function of the heart impairs its ability to supply sufficient blood flow to meet the  body's needs.  For further information please visit www.cardiosmart.org for detailed information on CHF. 4)  Your physician recommends that you weigh, daily, at the same time every day, and in the same amount of clothing.  Please record your daily weights on the handout provided and bring it to your next appointment. Prescriptions: CARVEDILOL 6.25 MG TABS (CARVEDILOL) one and 1/2 tablet twice a day  #90 x 6   Entered by:   Charolotte Capuchin, RN   Authorized by:   [name], MD, [facility]   Signed by:   Charolotte Capuchin, RN on 04/18/2010   Method used:   Electronically to        [facility]  [address] (retail)       [address]       [place], [place]  [address]       Ph: [phone] or [phone]       Fax: [phone]   RxID:   [id number]

## 2010-07-19 NOTE — Assessment & Plan Note (Signed)
Summary: ready to get difibulator/mt   Visit Type:  Follow-up Primary Provider:  Joycelyn Man  CC:  sob.  History of Present Illness: The patient returns for evaluation of his known coronary disease and cardiomyopathy and LBBB.  When I saw him 4 months ago I discussed ICD implant and he has been considering his options.  He now thinks he would like to proceed.  He does get short of breath with moderate activity but is not describing resting shortness of breath, PND or orthopnea. He is not describing chest pressure, neck or arm discomfort. He is not describing palpitations, presyncope or syncope. He wears his compression stockings. He has been intolerant of higher doses of beta blocker and is currently on 6.25 mg of coreg twice daily.  Current Medications (verified): 1)  Delatestryl 200 Mg/ml Oil (Testosterone Enanthate) .... 250mg  Im Monthly. 2)  IT consultant  Strp (Glucose Blood) .... Check Daily  Diagnosis Code 250.00 3)  Tylenol .... As Needed (2-3 Daily) 4)  Lantus For Opticlik 100 Unit/ml Soln (Insulin Glargine) .... 20 Units Subcutaneously Once A Day. 5)  Carvedilol 12.5 Mg Tabs (Carvedilol) .... 1/2  Twice A Day 6)  Cvs Alcohol Swabs  Pads (Alcohol Swabs) .... As Needed 7)  Colchicine 0.6 Mg Tabs (Colchicine) .... As Needed (Last Taken May 2010) 8)  Nitrostat 0.4 Mg Subl (Nitroglycerin) .... As Needed (None Taken in Last Year) 9)  Aspirin 81 Mg Tbec (Aspirin) .... Take One By Mouth Daily 10)  Glucosamine Chondr 1500 Complx  Caps (Glucosamine-Chondroit-Vit C-Mn) .... Take One By Mouth Two Times A Day 11)  Vitamin D (Ergocalciferol) 50000 Unit Caps (Ergocalciferol) .[name].. 1 By Mouth Weekly 12)  Pravastatin Sodium 40 Mg Tabs (Pravastatin Sodium) .... One At Bedtime 13)  Bd Pen Needle Mini U/f 31g X 5 Mm Misc (Insulin Pen Needle) .... Use As Directed 14)  Glucerna Shake  Liqd (Nutritional Supplements) .... 4 Oz. Per Day With Breakfast  Allergies (verified): 1)  ! Nsaids 2)   * Vioxx (Rofecoxib) 3)  * Crestor 4)  Celebrex (Celecoxib)  Past History:  Past Surgical History: Last updated: 01/26/2009 CABG (eft internal mammaryartery graft to the left anterior descending coronary artery, with a saphenous vein graft to the first diagonal branch of the LAD, a saphenous vein graft to the obtuse marginal branch of the leftcircumflex coronary artery, and a saphenous vein graft to the posterior descending branch of the right coronary artery.  Dr, Laneta Simmers 09/05/2008) Choleycystectomy  1970's Bowel obstruction, small mid 80's (laparotomy), 09/12/2000 (no surgery), 10/20/2008 (no surgery) Lesion removal RLE 11/14/2006 (Dr Lindie Spruce)  Partial left Nephrectomy Clear Cell Renal Cell Carcinoma 01/09/2009  Review of Systems  The patient denies chest pain, syncope, dyspnea on exertion, peripheral edema, and prolonged cough.    Vital Signs:  Patient profile:   77 year old male Height:      72 inches Weight:      221 pounds BMI:     30.08 BP sitting:   126 / 68  (left arm) Cuff size:   large  Vitals Entered By: Burnett Kanaris, CNA (March 26, 2010 1:46 PM)  Physical Exam  General:  GEN: nad, alert and oriented HEENT: mucous membranes moist NECK: supple w/o LA CV: rrr PULM: ctab, no inc wob ABD: soft, +bs EXT: trace edema, was in compression stockings SKIN: no acute rash    EKG  Procedure date:  03/26/2010  Findings:      Normal sinus rhythm with rate of: 180.  Left bundle branch block.    Impression & Recommendations:  Problem # 1:  CHRONIC SYSTOLIC HEART FAILURE (ICD-428.22) His symptoms are class 2-3 and he has a QRS of 180 ms with first degree AV block. I have discussed the treatment options with the patient and he wishes to proceed with BiV ICD implant. His updated medication list for this problem includes:    Carvedilol 12.5 Mg Tabs (Carvedilol) .[name]... 1/2  twice a day    Nitrostat 0.4 Mg Subl (Nitroglycerin) .[name]... As needed (none taken in last year)     Aspirin 81 Mg Tbec (Aspirin) .[name]... Take one by mouth daily  Problem # 2:  ISCHEMIC CARDIOMYOPATHY  EF 25% (ICD-414.8) He currently denies anginal symptoms.  Will continue meds as below. His updated medication list for this problem includes:    Carvedilol 12.5 Mg Tabs (Carvedilol) .[name]... 1/2  twice a day    Nitrostat 0.4 Mg Subl (Nitroglycerin) .[name]... As needed (none taken in last year)    Aspirin 81 Mg Tbec (Aspirin) .[name]... Take one by mouth daily  Problem # 3:  OBESITY (ICD-278.00)

## 2010-07-19 NOTE — Progress Notes (Signed)
Summary: Does he still need omeprazole?  Phone Note Call from Patient   Summary of Call: He asked (here at wife's visit) if he still needs omeprazole which was started for chest pain 1 1/2 years. No clear diagnosis of GERD. He is asking if he still needs it. Have sugested that he try to taper off every other day x 2 weeks. If recurrent symptoms after 1 month restart or see me. Initial call taken by: Iva Boop MD, Clementeen Graham,  September 19, 2009 4:18 PM     Appended Document: Does he still need omeprazole?

## 2010-07-19 NOTE — Cardiovascular Report (Signed)
Summary: Office Visit   Office Visit   Imported By: Roderic Ovens 05/01/2010 17:09:25  _____________________________________________________________________  External Attachment:    Type:   Image     Comment:   External Document

## 2010-07-19 NOTE — Assessment & Plan Note (Signed)
Summary: TESTOSTERONE  Nurse Visit   Allergies: 1)  ! Nsaids 2)  * Vioxx (Rofecoxib) 3)  * Crestor 4)  Celebrex (Celecoxib)  Medication Administration  Injection # 1:    Medication: Testosterone Cypionat 200mg  ing    Diagnosis: TESTOSTERONE DEFICIENCY (ICD-257.2)    Route: IM    Site: RUOQ gluteus    Exp Date: 01/16/2012    Lot #: [id number]    Mfr: Claudette Laws    Comments: pt received 250mg  or 1 1/8ml    Patient tolerated injection without complications    Given by: Mervin Hack CMA Murphy Bundick Dull) (June 21, 2010 9:52 AM)  Orders Added: 1)  Admin of patients own med IM/SQ [[id number]]   Medication Administration  Injection # 1:    Medication: Testosterone Cypionat 200mg  ing    Diagnosis: TESTOSTERONE DEFICIENCY (ICD-257.2)    Route: IM    Site: RUOQ gluteus    Exp Date: 01/16/2012    Lot #: [id number]    Mfr: Claudette Laws    Comments: pt received 250mg  or 1 1/53ml    Patient tolerated injection without complications    Given by: Mervin Hack CMA Mende Biswell Dull) (June 21, 2010 9:52 AM)  Orders Added: 1)  Admin of patients own med IM/SQ [phone]

## 2010-07-19 NOTE — Miscellaneous (Signed)
  Clinical Lists Changes  Observations: Added new observation of ECHOINTERP:    - Left ventricle: LVEF is approximately 20 to 25% with diffuse       hypokinesis; inferior, inferoseptal and apical akinesis. The       cavity size was mildly dilated. Wall thickness was increased in a       pattern of mild LVH. Doppler parameters are consistent with       abnormal left ventricular relaxation (grade 1 diastolic       dysfunction).     - Mitral valve: Mild regurgitation.     - Left atrium: The atrium was severely dilated.     - Right ventricle: Systolic function was mildly reduced.     - Right atrium: The atrium was mildly dilated. (11/09/2009 14:54)      Echocardiogram  Procedure date:  11/09/2009  Findings:         - Left ventricle: LVEF is approximately 20 to 25% with diffuse       hypokinesis; inferior, inferoseptal and apical akinesis. The       cavity size was mildly dilated. Wall thickness was increased in a       pattern of mild LVH. Doppler parameters are consistent with       abnormal left ventricular relaxation (grade 1 diastolic       dysfunction).     - Mitral valve: Mild regurgitation.     - Left atrium: The atrium was severely dilated.     - Right ventricle: Systolic function was mildly reduced.     - Right atrium: The atrium was mildly dilated.

## 2010-07-19 NOTE — Letter (Signed)
Summary: Dr.Jay Patel,[facility],Note  Dr.Jay Patel,[facility],Note   Imported By: Beau Fanny 07/21/2009 10:43:10  _____________________________________________________________________  External Attachment:    Type:   Image     Comment:   External Document

## 2010-07-19 NOTE — Assessment & Plan Note (Signed)
Summary: pain in lower stomach   Vital Signs:  Patient profile:   77 year old male Height:      72 inches Weight:      222.50 pounds BMI:     30.29 Temp:     97.9 degrees F oral Pulse rate:   76 / minute Pulse rhythm:   regular BP sitting:   130 / 74  (left arm) Cuff size:   large  Vitals Entered By: Delilah Shan CMA Duncan Dull) (January 16, 2010 3:46 PM) CC: Pain in lower stomach.  Please print RX for Testosterone and give to patient.   History of Present Illness: Lower abdominal pain- similar to prev from  before.  Returned after coming off cipro.    Also with RUQ pain, going on intermittently for years, "but this one isn't going away".  Prev cholecystectomy.    No fevers.  No vomiting.  Normal BMs and lower abdominal pain is better after BM.  Taking glucerna and that helps with regularity.  No BRBPR or melena with stools.    has follow up with Dr. Allena Katz Monday with Renal.    Needs refill on testosterone.  Doing well on the med and has had no complications with it.  No yet due for repeat level.   Problems Prior to Update: 1)  Abdominal Pain Other Specified Site  (ICD-789.09) 2)  Diverticulitis, Acute  (ICD-562.11) 3)  Vitamin D Deficiency  (ICD-268.9) 4)  Renal Cell Cancer  (ICD-189.0) 5)  Chronic Systolic Heart Failure  (ICD-428.22) 6)  Renal Disease, Chronic, Mild  (ICD-585.2) 7)  Ischemic Cardiomyopathy Ef 25%  (ICD-414.8) 8)  Cad Severe  (ICD-414.00) 9)  Gastrointestinal Xray, Abnormal  (ICD-793.4) 10)  Gerd  (ICD-530.81) 11)  Cystic Lesion Left Kidney, Ultrasound  (ICD-793.5) 12)  ? of Acute Pancreatitis  (ICD-577.0) 13)  Irritable Bowel Syndrome  (ICD-564.1) 14)  Degenerative Joint Disease  (ICD-715.90) 15)  Gastroenteritis  (ICD-558.9) 16)  Obesity  (ICD-278.00) 17)  Gout, Great Toe/l Ankle  (ICD-274.0) 18)  Arthropathy Nos, Multiple Sites  (ICD-716.99) 19)  Testosterone Deficiency  (ICD-257.2) 20)  Peptic Ulcer Disease, Helicobacter Pylori Positive   (ICD-533.90) 21)  Diabetes Mellitus, Type II  (ICD-250.00) 22)  Hyperlipidemia  (ICD-272.4) 23)  Adrenal Mass F/u Mult Times, No Longer Required  (ICD-255.8) 24)  Prostatitis, Hx of  (ICD-V13.09) 25)  Arthropathy Nos, Unspecified Site  (ICD-716.90) 26)  Hypertension  (ICD-401.9) 27)  Pud, Hx of  (ICD-V12.71) 28)  Neop, Malignant, Skin, Upper Limb Bcc  (ICD-173.6) 29)  Small Bowel Obstruction,init 80's Recurr 3/02  (ICD-560.9)  Allergies: 1)  * Vioxx (Rofecoxib) 2)  Celebrex (Celecoxib) 3)  * Crestor  Physical Exam  General:  GEN: nad, alert and oriented, obese HEENT: mucous membranes moist NECK: supple w/o LA CV: rrr.   PULM: ctab, no inc wob ABD: soft, +bs, mild ruq tenderness.  no rebound.  also tender to palpation in lower abdominal near suprapubic area.  EXT: no edema, in compression stockings . SKIN: no acute rash    Impression & Recommendations:  Problem # 1:  TESTOSTERONE DEFICIENCY (ICD-257.2) Refill of med done.  will be due for labs later this fall. Orders: Admin of Therapeutic Inj  intramuscular or subcutaneous (16109)  Problem # 2:  ABDOMINAL PAIN OTHER SPECIFIED SITE (ICD-789.09) The lower abdominal pain is likely due to return of diverticulitis.  He is not toxic and did well while on the cipro before.  Will repeat course and add flaygl.  I  would not use CT scan with contrast at this point due to renal fnx.  Pt will call back if pain increases or doesn't improve.    Will check LFTs and lipase given RUQ pain.  This has been present (intermittently) for a longer period.  We can work this up if it continues or if labs are abnormal.  He is in NAD and nontoxic and okay for outpatient follow up.  He agrees with the plan.  His updated medication list for this problem includes:    Aspirin 81 Mg Tbec (Aspirin) .[name]... Take one by mouth daily  Orders: TLB-Hepatic/Liver Function Pnl (80076-HEPATIC) TLB-Lipase (83690-LIPASE)  Complete Medication List: 1)  Delatestryl 200  Mg/ml Oil (Testosterone enanthate) .... 200mg  im monthly. 2)  IT consultant Strp (Glucose blood) .... Check daily  diagnosis code 250.00 3)  Tylenol  .... As needed 4)  Lantus For Opticlik 100 Unit/ml Soln (Insulin glargine) .... 20 units subcutaneously once a day. 5)  Carvedilol 6.25 Mg Tabs (Carvedilol) .... Take 1 and 1/2 tablets twice a day 6)  Cvs Alcohol Swabs Pads (Alcohol swabs) .... As needed 7)  Colchicine 0.6 Mg Tabs (Colchicine) .... As needed 8)  Nitrostat 0.4 Mg Subl (Nitroglycerin) .... As needed 9)  Aspirin 81 Mg Tbec (Aspirin) .... Take one by mouth daily 10)  Glucosamine Chondr 1500 Complx Caps (Glucosamine-chondroit-vit c-mn) .... Take one by mouth two times a day 11)  Vitamin D (ergocalciferol) 50000 Unit Caps (Ergocalciferol) .[name].. 1 by mouth weekly 12)  Pravastatin Sodium 40 Mg Tabs (Pravastatin sodium) .... One at bedtime 13)  Bd Pen Needle Mini U/f 31g X 5 Mm Misc (Insulin pen needle) .... Use as directed 14)  Cipro 750 Mg Tabs (Ciprofloxacin hcl) .[name].. 1 by mouth two times a day x14 days 15)  Flagyl 500 Mg Tabs (Metronidazole) .[name].. 1 by mouth three times a day for 14 days  Patient Instructions: 1)  Take the cipro and flagyl.  Let me know if the pain isn't getting better or if you have a fever.  We'll contact you with your lab report.  Prescriptions: DELATESTRYL 200 MG/ML OIL (TESTOSTERONE ENANTHATE) 200mg  IM monthly.  #1 vial (5ml) x 0   Entered and Authorized by:   Crawford Givens MD   Signed by:   Crawford Givens MD on 01/16/2010   Method used:   Print then Give to Patient   RxID:   [id number] FLAGYL 500 MG TABS (METRONIDAZOLE) 1 by mouth three times a day for 14 days  #42 x 0   Entered and Authorized by:   Crawford Givens MD   Signed by:   Crawford Givens MD on 01/16/2010   Method used:   Print then Give to Patient   RxID:   [id number] CIPRO 750 MG TABS (CIPROFLOXACIN HCL) 1 by mouth two times a day x14 days  #28 x 0   Entered and Authorized by:    Crawford Givens MD   Signed by:   Crawford Givens MD on 01/16/2010   Method used:   Print then Give to Patient   RxID:   [id number]    Medication Administration  Injection # 1:    Medication: Testosterone Cypionat 200mg  ing    Diagnosis: TESTOSTERONE DEFICIENCY (ICD-257.2)    Route: IM    Site: R deltoid    Exp Date: 01/14/2010    Lot #: [id number]    Mfr: Gaylyn [facility]ong [facility]    Patient tolerated injection without complications    Given by: Laurette Schimke  [place] CMA Duncan Dull) (January 16, 2010 4:02 PM)  Orders Added: 1)  Admin of Therapeutic Inj  intramuscular or subcutaneous [96372] 2)  Est. Patient Level III [04540] 3)  TLB-Hepatic/Liver Function Pnl [80076-HEPATIC] 4)  TLB-Lipase [83690-LIPASE]   Current Allergies (reviewed today): * VIOXX (ROFECOXIB) CELEBREX (CELECOXIB) * CRESTOR

## 2010-07-19 NOTE — Assessment & Plan Note (Signed)
Summary: nep/pt needs ICD ef >30%   Visit Type:  Follow-up Primary Provider:  Joycelyn Man   History of Present Illness: The patient presents for consideration for BiV ICD implant.   He is a pleasant elderly man with a h/o ICM, s/p MI, s/p CABG, Class 2-3 CHF and LBBB with a QRS duration of 178 ms.  The patient denies syncope or peripheral edema.  He denies C/P except for some incisional pain.  His EF is 30%.  He walks some but gets fatigued and has to stop.    Current Medications (verified): 1)  Delatestryl 200 Mg/ml Oil (Testosterone Enanthate) .... 200mg  Im Monthly. 2)  IT consultant  Strp (Glucose Blood) .... Check Daily  Diagnosis Code 250.00 3)  Tylenol .... As Needed 4)  Lantus For Opticlik 100 Unit/ml Soln (Insulin Glargine) .... 20 Units Subcutaneously Once A Day. 5)  Carvedilol 6.25 Mg Tabs (Carvedilol) .... Take 1 and 1/2 Tablets Twice A Day 6)  Bd Insulin Syringe Ultrafine 30g X 1/2" 0.3 Ml Misc (Insulin Syringe-Needle U-100) .... Use With Solostar Pens 7)  Cvs Alcohol Swabs  Pads (Alcohol Swabs) .... As Needed 8)  Colchicine 0.6 Mg Tabs (Colchicine) .... As Needed 9)  Nitrostat 0.4 Mg Subl (Nitroglycerin) .... As Needed 10)  Aspirin 81 Mg Tbec (Aspirin) .... Take One By Mouth Daily 11)  Glucosamine Chondr 1500 Complx  Caps (Glucosamine-Chondroit-Vit C-Mn) .... Take One By Mouth Two Times A Day 12)  Ferrous Sulfate 325 (65 Fe) Mg Tabs (Ferrous Sulfate) .... Take One By Mouth Daily 13)  Vitamin D (Ergocalciferol) 50000 Unit Caps (Ergocalciferol) .[name].. 1 By Mouth Weekly 14)  Fluorouracil 5 % Crea (Fluorouracil) .... Apply To Face Two Times A Day 15)  Pravastatin Sodium 40 Mg Tabs (Pravastatin Sodium) .... One At Bedtime  Allergies (verified): 1)  * Vioxx (Rofecoxib) 2)  Celebrex (Celecoxib) 3)  * Crestor  Past History:  Past Medical History: Last updated: 10/24/2009 Hypertension (06/17/1981) Diverticulosis, colon (06/18/1983) Hyperlipidemia  (06/17/1993) Diabetes mellitus, type II (06/17/1997) Renal Insufficiency Peptic Ulcer Disease 1960's CT Abd fatty process unchgd  myelolipoma adrenal unchgd 06/1997 CT Abd Adrenal mass unchgd no further f/u reqd 02/99 HOSP SBO 08/2000 Colonoscopy 4mm Sigmoid Polyp Diverticuosis (Dr Leone Payor) 12/29/2007   No repeat HOSP CP Pancreatitis  1/24-1/27/2010 EGD [place] Ring  2cm HH  (Dr Arlyce Dice) 07/12/2008 ABD CT  fatty liver early pancreatitis  07/12/2008  Pelvic CT Nml 07/12/2008 Abd U/S Fatty infiltr liver complex cyst in lower pole left kidney 07/12/2008 HOSP/CATH (08/17/08) Sev 3 Vess CAD Isch  cardiomyopathy EF 25% Ant Akinesis Global Hypo 3/2-08/18/08  Past Surgical History: Last updated: 01/26/2009 CABG (eft internal mammaryartery graft to the left anterior descending coronary artery, with a saphenous vein graft to the first diagonal branch of the LAD, a saphenous vein graft to the obtuse marginal branch of the leftcircumflex coronary artery, and a saphenous vein graft to the posterior descending branch of the right coronary artery.  Dr, Laneta Simmers 09/05/2008) Choleycystectomy  1970's Bowel obstruction, small mid 80's (laparotomy), 09/12/2000 (no surgery), 10/20/2008 (no surgery) Lesion removal RLE 11/14/2006 (Dr Lindie Spruce)  Partial left Nephrectomy Clear Cell Renal Cell Carcinoma 01/09/2009  Family History: Last updated: 09/15/2008 Father dec 65 MI Angina Htn Mother dec 65 Valve dz, Ca 1/2 Brother dec Brain tumor  Social History: Last updated: 09/15/2008 The patient is retired.  He has been married since age   of 45.  They have 2 children.  They have grandchildren and great  grandchildren.  He does not smoke and rarely uses alcohol.      Review of Systems       All systems reviewed and negative except as noted in the HPI.  Vital Signs:  Patient profile:   77 year old male Height:      72 inches Weight:      224 pounds BMI:     30.49 Pulse rate:   69 / minute BP sitting:   130 / 80  (left  arm)  Vitals Entered By: Laurance Flatten CMA (November 17, 2009 8:25 AM)  Physical Exam  General:  Well developed, well nourished, in no acute distress. Thinner but still  overweight. Head:  Normocephalic and atraumatic without obvious abnormalities. No apparent alopecia but male pattern balding. Eyes:  Conjunctiva clear bilaterally.  Mouth:  Teeth, gums and palate normal. Oral mucosa normal. Neck:  Neck supple, no JVD. No masses, thyromegaly or abnormal cervical nodes. Lungs:  Normal respiratory effort, chest expands symmetrically. Lungs are clear to auscultation, no crackles or wheezes. Heart:  Normal rate and regular rhythm. S1 and S2 normal without gallop, murmur, click, rub or other extra sounds. Abdomen:  Bowel sounds positive; abdomen soft and non-tender without masses, organomegaly, or hernias noted. No hepatosplenomegaly. Mildly protuberant. Msk:  Back normal, normal gait. Muscle strength and tone normal. Pulses:  normal pedal pulses bilaterally.   Extremities:  No clubbing or cyanosis. Neurologic:  Alert and oriented x 3.   Impression & Recommendations:  Problem # 1:  CHRONIC SYSTOLIC HEART FAILURE (ICD-428.22) I have discussed the treatment options with the patient.  He is on maximal medical therapy (no ACE with renal cell CA) and has class 2-3 symptoms.  BiV ICD implant has been discussed and the risks/benefits/goals/expectations of device implant has been discussed and he is considering his options and will call us if he would like to proceed. His updated medication list for this problem includes:    Carvedilol 6.25 Mg Tabs (Carvedilol) .[name]... Take 1 and 1/2 tablets twice a day    Nitrostat 0.4 Mg Subl (Nitroglycerin) .[name]... As needed    Aspirin 81 Mg Tbec (Aspirin) .[name]... Take one by mouth daily  Problem # 2:  ISCHEMIC CARDIOMYOPATHY  EF 25% (ICD-414.8) He denies anginal symptoms.  A low sodiumd diet is requested. His updated medication list for this problem includes:    Carvedilol  6.25 Mg Tabs (Carvedilol) .[name]... Take 1 and 1/2 tablets twice a day    Nitrostat 0.4 Mg Subl (Nitroglycerin) .[name]... As needed    Aspirin 81 Mg Tbec (Aspirin) .[name]... Take one by mouth daily  Problem # 3:  HYPERLIPIDEMIA (ICD-272.4) He will continue his meds as below and maintain a low fat diet. His updated medication list for this problem includes:    Pravastatin Sodium 40 Mg Tabs (Pravastatin sodium) ..... One at bedtime

## 2010-07-19 NOTE — Letter (Signed)
Summary: [facility]  [facility]   Imported By: Lanelle Bal 02/13/2010 11:50:59  _____________________________________________________________________  External Attachment:    Type:   Image     Comment:   External Document  Appended Document: [facility]    Clinical Lists Changes  Observations: Added new observation of PAST MED HX: Hypertension (06/17/1981) Diverticulosis, colon (06/18/1983) Hyperlipidemia (06/17/1993) Diabetes mellitus, type II (06/17/1997) Renal Insufficiency- Dr. 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 Luisa Hart L renal CA s/p partial nephrectomy 2010 per Dr. Laverle Patter (02/13/2010 11:54)       Past History:  Past Medical History: Hypertension (06/17/1981) Diverticulosis, colon (06/18/1983) Hyperlipidemia (06/17/1993) Diabetes mellitus, type II (06/17/1997) Renal Insufficiency- Dr. Allena Katz with renal Peptic Ulcer Disease 1960's CT Abd fatty process unchgd  myelolipoma adrenal unchgd 06/1997 CT Abd Adrenal mass unchgd no further f/u reqd 02/99 HOSP SBO 08/2000 Colonoscopy 4mm Sigmoid Polyp Diverticuosis (Dr Leone Payor) 12/29/2007   No repeat HOSP CP Pancreatitis  1/24-1/27/2010 EGD [place] Ring  2cm HH  (Dr Arlyce Dice) 07/12/2008 ABD CT  fatty liver early pancreatitis  07/12/2008  Pelvic CT Nml 07/12/2008 Abd U/S Fatty infiltr liver  complex cyst in lower pole left kidney 07/12/2008 HOSP/CATH (08/17/08) Sev 3 Vess CAD Isch  cardiomyopathy EF 25% Ant Akinesis Global Hypo 3/2-08/18/08 Thyroid nodule per Dr. Luisa Hart L renal CA s/p partial nephrectomy 2010 per Dr. Laverle Patter

## 2010-07-19 NOTE — Progress Notes (Signed)
Summary: ready to get difibulator  Phone Note Call from Patient   Caller: Patient Reason for Call: Talk to Nurse Summary of Call: pt saw dr taylor to discuss difibulator in june and was told to think about it, he is now ready to have it done-has appt 10-10 does he need to be seen first or can he go ahead and schedule?-pls call [phone] or [phone] Initial call taken by: Glynda Jaeger,  February 26, 2010 4:21 PM  Follow-up for Phone Call        called pt phone busy -cell phone na and has no voicemail set up-will keep trying to call Glynda Jaeger  February 26, 2010 4:55 PM   Additional Follow-up for Phone Call Additional follow up Details #1::        Needs to be seen.

## 2010-07-19 NOTE — Progress Notes (Signed)
  Phone Note Call from Patient   Caller: Patient Summary of Call: Patient saw dr Ladona Ridgel yesterday and wanted you to know that he will be getting a defibrillator on 04/10/2010 at [facility]. Just an [place]. Initial call taken by: Carlton Adam,  March 27, 2010 3:45 PM  Follow-up for Phone Call        noted.  Follow-up by: Crawford Givens MD,  March 27, 2010 3:54 PM

## 2010-07-19 NOTE — Procedures (Signed)
Summary: wch/ appt is 10:45/g d   Current Medications (verified): 1)  Delatestryl 200 Mg/ml Oil (Testosterone Enanthate) .... 250mg  Im Monthly. 2)  IT consultant  Strp (Glucose Blood) .... Check Daily  Diagnosis Code 250.00 3)  Tylenol .... As Needed (2-3 Daily) 4)  Lantus For Opticlik 100 Unit/ml Soln (Insulin Glargine) .... 20 Units Subcutaneously Once A Day. 5)  Carvedilol 12.5 Mg Tabs (Carvedilol) .... 1/2  Twice A Day 6)  Cvs Alcohol Swabs  Pads (Alcohol Swabs) .... As Needed 7)  Nitrostat 0.4 Mg Subl (Nitroglycerin) .... As Needed (None Taken in Last Year) 8)  Aspirin 81 Mg Tbec (Aspirin) .... Take One By Mouth Daily 9)  Glucosamine Chondr 1500 Complx  Caps (Glucosamine-Chondroit-Vit C-Mn) .... Take One By Mouth Two Times A Day 10)  Pravastatin Sodium 40 Mg Tabs (Pravastatin Sodium) .... One At Bedtime 11)  Bd Pen Needle Mini U/f 31g X 5 Mm Misc (Insulin Pen Needle) .... Use As Directed 12)  Glucerna Shake  Liqd (Nutritional Supplements) .... 4 Oz. Per Day With Breakfast 13)  Vitamin D 1000 Unit Caps (Cholecalciferol) .... One By Mouth Daily  Allergies (verified): 1)  ! Nsaids 2)  * Vioxx (Rofecoxib) 3)  * Crestor 4)  Celebrex (Celecoxib)   ICD Specifications Following MD:  Lewayne Bunting, MD     ICD Vendor:  [employer]     ICD Model Number:  [id number]     ICD Serial Number:  [id number] ICD DOI:  04/10/2010     ICD Implanting MD:  Lewayne Bunting, MD  Lead 1:    Location: RA     DOI: 04/10/2010     Model #: [id number]     Serial #: [id number]     Status: active Lead 2:    Location: RV     DOI: 04/10/2010     Model #: 6213     Serial #: [id number]     Status: active Lead 3:    Location: LV     DOI: 04/10/2010     Model #: [id number]     Serial #: [id number]     Status: active  Indications::  CM   ICD Follow Up Battery Voltage:  3.18 V     Charge Time:  8.5 seconds     Underlying rhythm:  SR   ICD Device Measurements Atrium:  Amplitude: 3.1 mV, Impedance: 532 ohms, Threshold: 1.00 V at  0.40 msec Right Ventricle:  Amplitude: 4.3 mV, Impedance: 380 ohms, Threshold: 1.00 V at 0.40 msec Left Ventricle:  Impedance: 475 ohms, Threshold: 2.50 V at 0.40 msec Shock Impedance: 42/57 ohms   Episodes MS Episodes:  0     Coumadin:  No Shock:  0     ATP:  0     Nonsustained:  0     Atrial Pacing:  <0.1%     Ventricular Pacing:  100%  Brady Parameters Mode DDD     Lower Rate Limit:  50     Upper Rate Limit 130 PAV 200     Sensed AV Delay:  180  Tachy Zones VF:  200     VT:  171     Next Cardiology Appt Due:  07/17/2010 Tech Comments:  WOUND CHECK---STERI STRIPS REMOVED.  NO REDNESS OR SWELLING AT SITE.  NORMAL DEVICE FUNCTION.  TURNED ON 1:1 SVT. CHANGED MAX LEAD IMPEDANCES FOR LIA. CHANGED RV OUTPUT FROM 4.50 TO 3.50 V.  LV TURNED OFF ON 04-12-10 DUE TO DIAPHRAMATIC STIMULATION.  NO CHANGE IN ACTIVITY LEVEL OR SOB WITH LV LEAD BEING TURNED OFF.  ROV 07-17-10 W/GT. Vella Kohler  April 18, 2010 11:29 AM

## 2010-07-19 NOTE — Assessment & Plan Note (Signed)
Summary: TESTOSTERONE SHOT/DUNCAN/CLE  Nurse Visit   Allergies: 1)  ! Nsaids 2)  * Vioxx (Rofecoxib) 3)  * Crestor 4)  Celebrex (Celecoxib)  Medication Administration  Injection # 1:    Medication: Testosterone Cypionat 200mg  ing    Diagnosis: TESTOSTERONE DEFICIENCY (ICD-257.2)    Route: IM    Site: RUOQ gluteus    Exp Date: 01/16/2012    Lot #: [id number]    Mfr: [facility]    Patient tolerated injection without complications    Given by: Melody Comas (April 24, 2010 3:30 PM)  Orders Added: 1)  Admin of patients own med IM/SQ [phone]

## 2010-07-19 NOTE — Assessment & Plan Note (Signed)
Summary: BLADDER INFECTIONO/DLO   Vital Signs:  Patient profile:   77 year old male Weight:      227 pounds Temp:     97.6 degrees F oral Pulse rate:   80 / minute Pulse rhythm:   regular BP sitting:   136 / 80  (left arm) Cuff size:   large CC: ? UTI   History of Present Illness: Central low abdominal pain Sharp pain referred to penis Ongoing x 1 week. No dysuria.Darrell Kitchensomewhat uncomfortable. Pressure for bowel movement. No fever or chills.  No change in urinary frequency..  No constipation..no blood in stool.  No blood in urine.  Bouncing around on riding lawnmoer ..made pain worse  PAin 4-5/10 on pain scale treating with tylenol.  HX of renal cell cancer last year.  Due in next month for urologist, cardiologist, and Dr. Santiago Bur at [facility].  CRI.Darrell KitchenMarland KitchenLaAst creatine 2.7 in 09/2009... scheduled for labs next week.   HAs history of diverticuli.   Problems Prior to Update: 1)  Vitamin D Deficiency  (ICD-268.9) 2)  Renal Cell Cancer  (ICD-189.0) 3)  Chronic Systolic Heart Failure  (ICD-428.22) 4)  Renal Disease, Chronic, Mild  (ICD-585.2) 5)  Ischemic Cardiomyopathy Ef 25%  (ICD-414.8) 6)  Cad Severe  (ICD-414.00) 7)  Gastrointestinal Xray, Abnormal  (ICD-793.4) 8)  Gerd  (ICD-530.81) 9)  Cystic Lesion Left Kidney, Ultrasound  (ICD-793.5) 10)  ? of Acute Pancreatitis  (ICD-577.0) 11)  Irritable Bowel Syndrome  (ICD-564.1) 12)  Degenerative Joint Disease  (ICD-715.90) 13)  Gastroenteritis  (ICD-558.9) 14)  Obesity  (ICD-278.00) 15)  Gout, Great Toe/l Ankle  (ICD-274.0) 16)  Arthropathy Nos, Multiple Sites  (ICD-716.99) 17)  Testosterone Deficiency  (ICD-257.2) 18)  Peptic Ulcer Disease, Helicobacter Pylori Positive  (ICD-533.90) 19)  Diabetes Mellitus, Type II  (ICD-250.00) 20)  Hyperlipidemia  (ICD-272.4) 21)  Adrenal Mass F/u Mult Times, No Longer Required  (ICD-255.8) 22)  Prostatitis, Hx of  (ICD-V13.09) 23)  Arthropathy Nos, Unspecified Site   (ICD-716.90) 24)  Hypertension  (ICD-401.9) 25)  Pud, Hx of  (ICD-V12.71) 26)  Neop, Malignant, Skin, Upper Limb Bcc  (ICD-173.6) 27)  Small Bowel Obstruction,init 80's Recurr 3/02  (ICD-560.9)  Current Medications (verified): 1)  Delatestryl 200 Mg/ml Oil (Testosterone Enanthate) .... 200mg  Im Monthly. 2)  IT consultant  Strp (Glucose Blood) .... Check Daily  Diagnosis Code 250.00 3)  Tylenol .... As Needed 4)  Lantus For Opticlik 100 Unit/ml Soln (Insulin Glargine) .... 20 Units Subcutaneously Once A Day. 5)  Carvedilol 6.25 Mg Tabs (Carvedilol) .... Take 1 and 1/2 Tablets Twice A Day 6)  Cvs Alcohol Swabs  Pads (Alcohol Swabs) .... As Needed 7)  Colchicine 0.6 Mg Tabs (Colchicine) .... As Needed 8)  Nitrostat 0.4 Mg Subl (Nitroglycerin) .... As Needed 9)  Aspirin 81 Mg Tbec (Aspirin) .... Take One By Mouth Daily 10)  Glucosamine Chondr 1500 Complx  Caps (Glucosamine-Chondroit-Vit C-Mn) .... Take One By Mouth Two Times A Day 11)  Ferrous Sulfate 325 (65 Fe) Mg Tabs (Ferrous Sulfate) .... Take One By Mouth Daily 12)  Vitamin D (Ergocalciferol) 50000 Unit Caps (Ergocalciferol) .[name].. 1 By Mouth Weekly 13)  Fluorouracil 5 % Crea (Fluorouracil) .... Apply To Face Two Times A Day 14)  Pravastatin Sodium 40 Mg Tabs (Pravastatin Sodium) .... One At Bedtime 15)  Bd Pen Needle Mini U/f 31g X 5 Mm Misc (Insulin Pen Needle) .... Use As Directed  Allergies: 1)  * Vioxx (Rofecoxib) 2)  Celebrex (Celecoxib) 3)  *  Crestor  Past History:  Past medical, surgical, family and social histories (including risk factors) reviewed, and no changes noted (except as noted below).  Past Medical History: Reviewed history from 10/24/2009 and no changes required. Hypertension (06/17/1981) Diverticulosis, colon (06/18/1983) Hyperlipidemia (06/17/1993) Diabetes mellitus, type II (06/17/1997) Renal Insufficiency Peptic Ulcer Disease 1960's CT Abd fatty process unchgd  myelolipoma adrenal unchgd  06/1997 CT Abd Adrenal mass unchgd no further f/u reqd 02/99 HOSP SBO 08/2000 Colonoscopy 4mm Sigmoid Polyp Diverticuosis (Dr Leone Payor) 12/29/2007   No repeat HOSP CP Pancreatitis  1/24-1/27/2010 EGD Schatkzki Ring  2cm HH  (Dr Arlyce Dice) 07/12/2008 ABD CT  fatty liver early pancreatitis  07/12/2008  Pelvic CT Nml 07/12/2008 Abd U/S Fatty infiltr liver complex cyst in lower pole left kidney 07/12/2008 HOSP/CATH (08/17/08) Sev 3 Vess CAD Isch  cardiomyopathy EF 25% Ant Akinesis Global Hypo 3/2-08/18/08  Past Surgical History: Reviewed history from 01/26/2009 and no changes required. CABG (eft internal mammaryartery graft to the left anterior descending coronary artery, with a saphenous vein graft to the first diagonal branch of the LAD, a saphenous vein graft to the obtuse marginal branch of the leftcircumflex coronary artery, and a saphenous vein graft to the posterior descending branch of the right coronary artery.  Dr, Laneta Simmers 09/05/2008) Choleycystectomy  1970's Bowel obstruction, small mid 80's (laparotomy), 09/12/2000 (no surgery), 10/20/2008 (no surgery) Lesion removal RLE 11/14/2006 (Dr Lindie Spruce)  Partial left Nephrectomy Clear Cell Renal Cell Carcinoma 01/09/2009  Family History: Reviewed history from 09/15/2008 and no changes required. Father dec 65 MI Angina Htn Mother dec 65 Valve dz, Ca 1/2 Brother dec Brain tumor  Social History: Reviewed history from 09/15/2008 and no changes required. The patient is retired.  He has been married since age   of 3.  They have 2 children.  They have grandchildren and great   grandchildren.  He does not smoke and rarely uses alcohol.      Review of Systems General:  Denies fatigue and fever. CV:  Denies chest pain or discomfort. Resp:  Denies shortness of breath. GI:  Complains of abdominal pain. GU:  Denies dysuria.  Physical Exam  General:  obese appearing male in NAD Mouth:  Oral mucosa and oropharynx without lesions or exudates.MMM Neck:  no  carotid bruit or thyromegaly no cervical or supraclavicular lymphadenopathy  Lungs:  Normal respiratory effort, chest expands symmetrically. Lungs are clear to auscultation, no crackles or wheezes. Heart:  Normal rate and regular rhythm. S1 and S2 normal without gallop, murmur, click, rub or other extra sounds. Abdomen:  TTP in left lower quadrant and some central low abdomen... mild rebound and guarding  Pulses:  R and L posterior tibial pulses are full and equal bilaterally  Extremities:  mild bilateral lower extremity edema   Impression & Recommendations:  Problem # 1:  ABDOMINAL PAIN, LOWER (ICD-789.09) No clear UTI or pylelo. UA clear. More likely diverticulitis ( less likely prostate infection)...will treat empirically with cipro. MAy need addition of flagyl if not improving significantly over weekend or at follow up.  Close follow up early next week.  His updated medication list for this problem includes:    Aspirin 81 Mg Tbec (Aspirin) .[name]... Take one by mouth daily  Orders: UA Dipstick W/ Micro (manual) (95638)  Complete Medication List: 1)  Delatestryl 200 Mg/ml Oil (Testosterone enanthate) .... 200mg  im monthly. 2)  IT consultant Strp (Glucose blood) .... Check daily  diagnosis code 250.00 3)  Tylenol  .... As needed 4)  Lantus  For Opticlik 100 Unit/ml Soln (Insulin glargine) .... 20 units subcutaneously once a day. 5)  Carvedilol 6.25 Mg Tabs (Carvedilol) .... Take 1 and 1/2 tablets twice a day 6)  Cvs Alcohol Swabs Pads (Alcohol swabs) .... As needed 7)  Colchicine 0.6 Mg Tabs (Colchicine) .... As needed 8)  Nitrostat 0.4 Mg Subl (Nitroglycerin) .... As needed 9)  Aspirin 81 Mg Tbec (Aspirin) .... Take one by mouth daily 10)  Glucosamine Chondr 1500 Complx Caps (Glucosamine-chondroit-vit c-mn) .... Take one by mouth two times a day 11)  Ferrous Sulfate 325 (65 Fe) Mg Tabs (Ferrous sulfate) .... Take one by mouth daily 12)  Vitamin D (ergocalciferol) 50000 Unit Caps  (Ergocalciferol) .[name].. 1 by mouth weekly 13)  Fluorouracil 5 % Crea (Fluorouracil) .... Apply to face two times a day 14)  Pravastatin Sodium 40 Mg Tabs (Pravastatin sodium) .... One at bedtime 15)  Bd Pen Needle Mini U/f 31g X 5 Mm Misc (Insulin pen needle) .... Use as directed 16)  Ciprofloxacin Hcl 750 Mg Tabs (Ciprofloxacin hcl) .[name].. 1 tab by mouth two times a day x 10 days  Patient Instructions: 1)  Start antibiotics, keep up with moderate fluid intake. 2)  Follow up early next week for recheck.  3)  Go to ER if severe abdominal pain or not tolerating liquids or keeping antibiotics down.  Prescriptions: CIPROFLOXACIN HCL 750 MG TABS (CIPROFLOXACIN HCL) 1 tab by mouth two times a day x 10 days  #20 x 0   Entered and Authorized by:   Kerby Nora MD   Signed by:   Kerby Nora MD on 12/29/2009   Method used:   Electronically to        [facility]  [address] (retail)       [address]       [place], [place]  [address]       Ph: [phone] or [phone]       Fax: [phone]   RxID:   [id number]   Current Allergies (reviewed today): * VIOXX (ROFECOXIB) CELEBREX (CELECOXIB) * CRESTOR  Laboratory Results   Urine Tests  Date/Time Received: December 29, 2009 2:46 PM  Date/Time Reported: December 29, 2009 2:46 PM   Routine Urinalysis   Color: straw Appearance: Clear Glucose: 250   (Normal Range: Negative) Bilirubin: negative   (Normal Range: Negative) Ketone: negative   (Normal Range: Negative) Spec. Gravity: 1.015   (Normal Range: 1.003-1.035) Blood: small   (Normal Range: Negative) pH: 6.0   (Normal Range: 5.0-8.0) Protein: 30   (Normal Range: Negative) Urobilinogen: 0.2   (Normal Range: 0-1) Nitrite: negative   (Normal Range: Negative) Leukocyte Esterace: negative   (Normal Range: Negative)  Urine Microscopic WBC/HPF: 0 RBC/HPF: 0 Casts/LPF: 0

## 2010-07-19 NOTE — Assessment & Plan Note (Signed)
Summary: ROA FOR CHECK-UP/JRR   Vital Signs:  Patient profile:   77 year old male Height:      72 inches Weight:      223.6 pounds BMI:     30.44 Temp:     97.7 degrees F oral Pulse rate:   80 / minute Pulse rhythm:   regular BP sitting:   124 / 70  (left arm) Cuff size:   regular  Vitals Entered By: Benny Lennert CMA Duncan Dull) (January 02, 2010 3:22 PM)  History of Present Illness: Chief complaint recheck bladder   Seen 4 days ago..started on cipro for left lower quadrant abdominal pain flet to be most consistent with diverticultiits.  At that time...neg UA, no definite prostate symptoms.   SInce then pain in central/left lower quadrant.Marland Kitchenalmost 100 percent gone. Mild nausea, gas initially...now gone. No blood in stool. Daily BM. No dysuria. No fever.   Has 12  more pills left of 10 day course.   Eating and drinking well...4 lb weight loss.  Problems Prior to Update: 1)  Abdominal Pain, Lower  (ICD-789.09) 2)  Vitamin D Deficiency  (ICD-268.9) 3)  Renal Cell Cancer  (ICD-189.0) 4)  Chronic Systolic Heart Failure  (ICD-428.22) 5)  Renal Disease, Chronic, Mild  (ICD-585.2) 6)  Ischemic Cardiomyopathy Ef 25%  (ICD-414.8) 7)  Cad Severe  (ICD-414.00) 8)  Gastrointestinal Xray, Abnormal  (ICD-793.4) 9)  Gerd  (ICD-530.81) 10)  Cystic Lesion Left Kidney, Ultrasound  (ICD-793.5) 11)  ? of Acute Pancreatitis  (ICD-577.0) 12)  Irritable Bowel Syndrome  (ICD-564.1) 13)  Degenerative Joint Disease  (ICD-715.90) 14)  Gastroenteritis  (ICD-558.9) 15)  Obesity  (ICD-278.00) 16)  Gout, Great Toe/l Ankle  (ICD-274.0) 17)  Arthropathy Nos, Multiple Sites  (ICD-716.99) 18)  Testosterone Deficiency  (ICD-257.2) 19)  Peptic Ulcer Disease, Helicobacter Pylori Positive  (ICD-533.90) 20)  Diabetes Mellitus, Type II  (ICD-250.00) 21)  Hyperlipidemia  (ICD-272.4) 22)  Adrenal Mass F/u Mult Times, No Longer Required  (ICD-255.8) 23)  Prostatitis, Hx of  (ICD-V13.09) 24)  Arthropathy Nos,  Unspecified Site  (ICD-716.90) 25)  Hypertension  (ICD-401.9) 26)  Pud, Hx of  (ICD-V12.71) 27)  Neop, Malignant, Skin, Upper Limb Bcc  (ICD-173.6) 28)  Small Bowel Obstruction,init 80's Recurr 3/02  (ICD-560.9)  Current Medications (verified): 1)  Delatestryl 200 Mg/ml Oil (Testosterone Enanthate) .... 200mg  Im Monthly. 2)  IT consultant  Strp (Glucose Blood) .... Check Daily  Diagnosis Code 250.00 3)  Tylenol .... As Needed 4)  Lantus For Opticlik 100 Unit/ml Soln (Insulin Glargine) .... 20 Units Subcutaneously Once A Day. 5)  Carvedilol 6.25 Mg Tabs (Carvedilol) .... Take 1 and 1/2 Tablets Twice A Day 6)  Cvs Alcohol Swabs  Pads (Alcohol Swabs) .... As Needed 7)  Colchicine 0.6 Mg Tabs (Colchicine) .... As Needed 8)  Nitrostat 0.4 Mg Subl (Nitroglycerin) .... As Needed 9)  Aspirin 81 Mg Tbec (Aspirin) .... Take One By Mouth Daily 10)  Glucosamine Chondr 1500 Complx  Caps (Glucosamine-Chondroit-Vit C-Mn) .... Take One By Mouth Two Times A Day 11)  Vitamin D (Ergocalciferol) 50000 Unit Caps (Ergocalciferol) .[name].. 1 By Mouth Weekly 12)  Fluorouracil 5 % Crea (Fluorouracil) .... Apply To Face Two Times A Day 13)  Pravastatin Sodium 40 Mg Tabs (Pravastatin Sodium) .... One At Bedtime 14)  Bd Pen Needle Mini U/f 31g X 5 Mm Misc (Insulin Pen Needle) .... Use As Directed  Allergies: 1)  * Vioxx (Rofecoxib) 2)  Celebrex (Celecoxib) 3)  * Crestor  Past History:  Past medical, surgical, family and social histories (including risk factors) reviewed, and no changes noted (except as noted below).  Past Medical History: Reviewed history from 10/24/2009 and no changes required. Hypertension (06/17/1981) Diverticulosis, colon (06/18/1983) Hyperlipidemia (06/17/1993) Diabetes mellitus, type II (06/17/1997) Renal Insufficiency Peptic Ulcer Disease 1960's CT Abd fatty process unchgd  myelolipoma adrenal unchgd 06/1997 CT Abd Adrenal mass unchgd no further f/u reqd 02/99 HOSP SBO  08/2000 Colonoscopy 4mm Sigmoid Polyp Diverticuosis (Dr Leone Payor) 12/29/2007   No repeat HOSP CP Pancreatitis  1/24-1/27/2010 EGD Schatkzki Ring  2cm HH  (Dr Arlyce Dice) 07/12/2008 ABD CT  fatty liver early pancreatitis  07/12/2008  Pelvic CT Nml 07/12/2008 Abd U/S Fatty infiltr liver complex cyst in lower pole left kidney 07/12/2008 HOSP/CATH (08/17/08) Sev 3 Vess CAD Isch  cardiomyopathy EF 25% Ant Akinesis Global Hypo 3/2-08/18/08  Past Surgical History: Reviewed history from 01/26/2009 and no changes required. CABG (eft internal mammaryartery graft to the left anterior descending coronary artery, with a saphenous vein graft to the first diagonal branch of the LAD, a saphenous vein graft to the obtuse marginal branch of the leftcircumflex coronary artery, and a saphenous vein graft to the posterior descending branch of the right coronary artery.  Dr, Laneta Simmers 09/05/2008) Choleycystectomy  1970's Bowel obstruction, small mid 80's (laparotomy), 09/12/2000 (no surgery), 10/20/2008 (no surgery) Lesion removal RLE 11/14/2006 (Dr Lindie Spruce)  Partial left Nephrectomy Clear Cell Renal Cell Carcinoma 01/09/2009  Family History: Reviewed history from 09/15/2008 and no changes required. Father dec 65 MI Angina Htn Mother dec 65 Valve dz, Ca 1/2 Brother dec Brain tumor  Social History: Reviewed history from 09/15/2008 and no changes required. The patient is retired.  He has been married since age   of 20.  They have 2 children.  They have grandchildren and great   grandchildren.  He does not smoke and rarely uses alcohol.      Review of Systems General:  Denies fatigue and fever. CV:  Denies chest pain or discomfort. Resp:  Denies shortness of breath.  Physical Exam  General:  obese appearing male in NAD Mouth:  Oral mucosa and oropharynx without lesions or exudates.  Teeth in good repair. Neck:  no carotid bruit or thyromegaly no cervical or supraclavicular lymphadenopathy  Lungs:  Normal respiratory effort,  chest expands symmetrically. Lungs are clear to auscultation, no crackles or wheezes. Heart:  Normal rate and regular rhythm. S1 and S2 normal without gallop, murmur, click, rub or other extra sounds. Abdomen:  mild left lower quadrant pain... etter on exam today, no rebound, no guarding.  No CVA tenderness   Impression & Recommendations:  Problem # 1:  DIVERTICULITIS, ACUTE (ICD-562.11) Improved. Continue current antibitoic course. Continue fluids, moderate. Follow up if not continuing to improve.    Problem # 2:  RENAL CELL CANCER (ICD-189.0)  Complete Medication List: 1)  Delatestryl 200 Mg/ml Oil (Testosterone enanthate) .... 200mg  im monthly. 2)  IT consultant Strp (Glucose blood) .... Check daily  diagnosis code 250.00 3)  Tylenol  .... As needed 4)  Lantus For Opticlik 100 Unit/ml Soln (Insulin glargine) .... 20 units subcutaneously once a day. 5)  Carvedilol 6.25 Mg Tabs (Carvedilol) .... Take 1 and 1/2 tablets twice a day 6)  Cvs Alcohol Swabs Pads (Alcohol swabs) .... As needed 7)  Colchicine 0.6 Mg Tabs (Colchicine) .... As needed 8)  Nitrostat 0.4 Mg Subl (Nitroglycerin) .... As needed 9)  Aspirin 81 Mg Tbec (Aspirin) .... Take one by mouth  daily 10)  Glucosamine Chondr 1500 Complx Caps (Glucosamine-chondroit-vit c-mn) .... Take one by mouth two times a day 11)  Vitamin D (ergocalciferol) 50000 Unit Caps (Ergocalciferol) .[name].. 1 by mouth weekly 12)  Fluorouracil 5 % Crea (Fluorouracil) .... Apply to face two times a day 13)  Pravastatin Sodium 40 Mg Tabs (Pravastatin sodium) .... One at bedtime 14)  Bd Pen Needle Mini U/f 31g X 5 Mm Misc (Insulin pen needle) .... Use as directed  Patient Instructions: 1)  Follow up if not continuing to improve.  Current Allergies (reviewed today): * VIOXX (ROFECOXIB) CELEBREX (CELECOXIB) * CRESTOR

## 2010-07-19 NOTE — Assessment & Plan Note (Signed)
Summary: CPX  CYD   Vital Signs:  Patient profile:   77 year old male Height:      72 inches Weight:      221.25 pounds BMI:     30.12 Temp:     97.4 degrees F oral Pulse rate:   76 / minute Pulse rhythm:   regular BP sitting:   116 / 74  (left arm) Cuff size:   large  Vitals Entered By: Delilah Shan CMA Duncan Dull) (March 23, 2010 8:50 AM) CC: CPX   History of Present Illness: Abd pain resolved.  See prev OV.   Low Testosterone.  "I feel better when I'm on it."  "I don't notice a lift right after I take it." I d/w patient [id number] and dosing.  See plan.     Renal function followed by Dr. Allena Katz.  D/w patient about not using NSAIDS.   OA- using occ tylenol with relief.  bilateral knee, R shoulder, bilateral hand pain.   At baseline.   Diabetes:  Using medications without difficulties:yes.  "I cheat with some sweets." Hypoglycemic episodes: no Hyperglycemic episodes:no symptoms  Feet problems:no Blood Sugars averaging: 100-150 eye exam within last year: yes, 1/11, no change per report per patient.   still drinking sweet tea  CAD- was more shortwinded on higher dose of betablocker.  Pt dropped back to total of 12.5mg  of coreg  total per day and symptoms resolved.  Occ CP, very rare. no NTG use.  Will follow up re: pacer with cards.   Allergies: 1)  ! Nsaids 2)  * Vioxx (Rofecoxib) 3)  * Crestor 4)  Celebrex (Celecoxib)  Past History:  Past Medical History: Reviewed history from 02/13/2010 and no changes required. Hypertension (06/17/1981) Diverticulosis, colon (06/18/1983) Hyperlipidemia (06/17/1993) Diabetes mellitus, type II (06/17/1997) Renal Insufficiency- Dr. 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 Luisa Hart L renal CA s/p partial nephrectomy 2010 per Dr. Laverle Patter  Past Surgical History: Reviewed history from 01/26/2009 and no changes required. CABG (eft internal mammaryartery graft to the left anterior descending coronary artery, with a saphenous vein graft to the first diagonal branch of the LAD, a saphenous vein graft to the obtuse marginal branch of the leftcircumflex coronary artery, and a saphenous vein graft to the posterior descending branch of the right coronary artery.  Dr, Laneta Simmers 09/05/2008) Choleycystectomy  1970's Bowel obstruction, small mid 80's (laparotomy), 09/12/2000 (no surgery), 10/20/2008 (no surgery) Lesion removal RLE 11/14/2006 (Dr Lindie Spruce)  Partial left Nephrectomy Clear Cell Renal Cell Carcinoma 01/09/2009  Family History: Reviewed history from 09/15/2008 and no changes required. Father dec 65 MI Angina Htn Mother dec 65 Valve dz, breast Ca 1/2 Brother dec Brain tumor at 80  Social History: Reviewed history from 09/15/2008 and no changes required. The patient is retired.  He has been married since age   of 71.  They have 2 children.  They have grandchildren and great   grandchildren.  He does not smoke and rarely uses alcohol.  Retired from Development worker, international [employer].   Review of Systems       See HPI.  Otherwise negative.    Physical Exam  General:  GEN: nad, alert and oriented HEENT: mucous membranes moist NECK: supple w/o LA CV: rrr PULM: ctab, no inc wob ABD: soft, +bs EXT: trace edema, was in compression stockings SKIN: no acute rash    Impression & Recommendations:  Problem # 1:  DEGENERATIVE JOINT DISEASE  (ICD-715.90) no change in meds.  Continue to avoid NSAIDS. His updated medication list for this problem includes:    Aspirin 81 Mg Tbec (Aspirin) .[name]... Take one by mouth daily  Problem # 2:  CAD SEVERE (ICD-414.00) Minimal symptoms now that BB was decreased.  No other changes in meds, will follow up with cards.  His updated medication list for this problem includes:    Carvedilol 12.5 Mg Tabs (Carvedilol) .[name]... 1/2  twice a day    Nitrostat 0.4 Mg Subl (Nitroglycerin) .[name]... As needed (none taken in last year)    Aspirin 81 Mg Tbec (Aspirin) .[name]... Take one by mouth daily  Problem # 3:  TESTOSTERONE DEFICIENCY (ICD-257.2) labs reviewed with patient and will increase to 250mg  a month with recheck in 6 months.  He agrees.  Orders: Admin of Therapeutic Inj  intramuscular or subcutaneous (04540)  Problem # 4:  DIABETES MELLITUS, TYPE II (ICD-250.00) labs d/w patient. will titrate lantus and follow up for repeat A1c. he agrees.  His updated medication list for this problem includes:    Lantus For Opticlik 100 Unit/ml Soln (Insulin glargine) .[name]... 20 units subcutaneously once a day.    Aspirin 81 Mg Tbec (Aspirin) .[name]... Take one by mouth daily  Complete Medication List: 1)  Delatestryl 200 Mg/ml Oil (Testosterone enanthate) .... 250mg  im monthly. 2)  IT consultant Strp (Glucose blood) .... Check daily  diagnosis code 250.00 3)  Tylenol  .... As needed (2-3 daily) 4)  Lantus For Opticlik 100 Unit/ml Soln (Insulin glargine) .... 20 units subcutaneously once a day. 5)  Carvedilol 12.5 Mg Tabs (Carvedilol) .... 1/2  twice a day 6)  Cvs Alcohol Swabs Pads (Alcohol swabs) .... As needed 7)  Colchicine 0.6 Mg Tabs (Colchicine) .... As needed (last taken may 2010) 8)  Nitrostat 0.4 Mg Subl (Nitroglycerin) .... As needed (none taken in last year) 9)  Aspirin 81 Mg Tbec (Aspirin) .... Take one by mouth daily 10)  Glucosamine Chondr 1500 Complx Caps (Glucosamine-chondroit-vit c-mn) .... Take one  by mouth two times a day 11)  Vitamin D (ergocalciferol) 50000 Unit Caps (Ergocalciferol) .[name].. 1 by mouth weekly 12)  Pravastatin Sodium 40 Mg Tabs (Pravastatin sodium) .... One at bedtime 13)  Bd Pen Needle Mini U/f 31g X 5 Mm Misc (Insulin pen needle) .... Use as directed 14)  Glucerna Shake Liqd (Nutritional supplements) .... 4 oz. per day with breakfast  Patient Instructions: 1)  If your AM sugar is above 130, then add 1unit of lantus. 2)  If your AM sugar is below 99, then subtract 1unit of lantus. 3)  If your sugar is 100-30, don't change your dose.  4)  Come back in 6 months with labs ahead of time. 5)  A1c, cmet, lipid 250.00 6)  testosterone, cbc, psa 257.2 7)  Take care.  Glad to see you today.   Current Allergies (reviewed today): ! NSAIDS * VIOXX (ROFECOXIB) * CRESTOR CELEBREX (CELECOXIB)  Medication Administration  Injection # 1:    Medication: Testosterone Cypionat 200mg  ing    Diagnosis: TESTOSTERONE DEFICIENCY (ICD-257.2)    Route: IM    Site: RUOQ gluteus    Exp Date: 02/15/2012    Lot #: [id number]    Mfr: Hikman    Comments: 250 mg. given per GSD, 1.25 ml.    Patient tolerated injection without complications    Given by: Delilah Shan CMA Duncan Dull) (March 23, 2010 9:54 AM)  Orders Added: 1)  Est. Patient Level IV [04540] 2)  Admin of Therapeutic Inj  intramuscular or subcutaneous [96372]    Immunization History:  Influenza Immunization History:    Influenza:  historical (03/16/2010)

## 2010-07-19 NOTE — Cardiovascular Report (Signed)
Summary: Office Visit   Office Visit   Imported By: Roderic Ovens 05/01/2010 17:06:47  _____________________________________________________________________  External Attachment:    Type:   Image     Comment:   External Document

## 2010-07-19 NOTE — Progress Notes (Signed)
Summary: RX Lantus Solarstar and needles  Phone Note Refill Request Call back at [phone] Message from:  [facility] on November 20, 2009 1:08 PM  Refills Requested: Medication #1:  BD PEN NEEDLE MINI U/F 31G X 5 MM MISC Use as directed. Received refill request for Lantus Solarstar 100 units/ml, inject 10 untis, does not match med sheet, we show 20 untis    Method Requested: Electronic Initial call taken by: Sydell Axon LPN,  November 20, 5364 1:10 PM  Follow-up for Phone Call        He sees another doctor for his diabetes care. Use the higher dose of insulin for his script. Follow-up by: Shaune Leeks MD,  November 20, 2009 1:43 PM    New/Updated Medications: BD PEN NEEDLE MINI U/F 31G X 5 MM MISC (INSULIN PEN NEEDLE) Use as directed Prescriptions: LANTUS FOR OPTICLIK 100 UNIT/ML SOLN (INSULIN GLARGINE) 20 units Subcutaneously once a day.  #1 bottle x 12   Entered and Authorized by:   Shaune Leeks MD   Signed by:   Shaune Leeks MD on 11/20/2009   Method used:   [facility]        [facility] [facility]ail[facility]       [address]       [place], [place]  [address]       Ph: [phone] or [phone]       Fax: [phone]   RxID:   [id number] BD PEN NEEDLE MINI U/F 31G X 5 MM MISC (INSULIN PEN NEEDLE) Use as directed  #100 x PRN   Entered by:   Sydell Axon LPN   Authorized by:   Shaune Leeks MD   Signed by:   Sydell Axon LPN on 12/08/7626   Method used:   Electronically [facility]        [facility] (retail)       [address]       [place], [place]  [address]       Ph: [phone] or [phone]       Fax: [phone]   RxID:   [id number]

## 2010-07-19 NOTE — Assessment & Plan Note (Signed)
Summary: Lusia Greis shot pt has meds/rbh  Nurse Visit   Allergies: 1)  * Vioxx (Rofecoxib) 2)  Celebrex (Celecoxib) 3)  * Crestor  Medication Administration  Injection # 1:    Medication: Testosterone Cypionat 200mg  ing    Diagnosis: TESTOSTERONE DEFICIENCY (ICD-257.2)    Route: IM    Site: LUOQ gluteus    Exp Date: 10/15/2009    Lot #: [id number]    Mfr: Gaylyn Rong     Comments: Patient provided his own medication    Patient tolerated injection without complications    Given by: Sydell Axon LPN (August 30, 2009 3:13 PM)  Orders Added: 1)  Admin of Therapeutic Inj  intramuscular or subcutaneous [96372]   Medication Administration  Injection # 1:    Medication: Testosterone Cypionat 200mg  ing    Diagnosis: TESTOSTERONE DEFICIENCY (ICD-257.2)    Route: IM    Site: LUOQ gluteus    Exp Date: 10/15/2009    Lot #: [id number]    Mfr: Gaylyn Rong     Comments: Patient provided his own medication    Patient tolerated injection without complications    Given by: Sydell Axon LPN (August 30, 2009 3:13 PM)  Orders Added: 1)  Admin of Therapeutic Inj  intramuscular or subcutaneous [78295]

## 2010-07-19 NOTE — Miscellaneous (Signed)
Summary: Orders Update  EP referral  Clinical Lists Changes  Orders: Added new Referral order of EP Referral (Cardiology EP Ref ) - Signed

## 2010-07-19 NOTE — Progress Notes (Signed)
Summary: can pt take the pneumonia shot  Phone Note Call from Patient Call back at Home Phone [phone]   Caller: Patient Reason for Call: Talk to Nurse Details for Reason: can pt take the pneumonia shot.  Initial call taken by: Lorne Skeens,  April 19, 2010 11:01 AM  Follow-up for Phone Call        ok to take pnuemonia shot Follow-up by: Charolotte Capuchin, RN,  April 19, 2010 11:56 AM

## 2010-07-19 NOTE — Assessment & Plan Note (Signed)
Summary: INJECTION/DLO  Nurse Visit   Allergies: 1)  * Vioxx (Rofecoxib) 2)  Celebrex (Celecoxib) 3)  * Crestor  Medication Administration  Injection # 1:    Medication: Testosterone Cypionat 200mg  ing    Diagnosis: TESTOSTERONE DEFICIENCY (ICD-257.2)    Route: IM    Site: LUOQ gluteus    Exp Date: 12/2009    Lot #: [id number]    Mfr: Gaylyn Rong    Patient tolerated injection without complications    Given by: Lowella Petties CMA (December 15, 2009 1:23 PM)  Orders Added: 1)  Admin of Therapeutic Inj  intramuscular or subcutaneous [96372]   Medication Administration  Injection # 1:    Medication: Testosterone Cypionat 200mg  ing    Diagnosis: TESTOSTERONE DEFICIENCY (ICD-257.2)    Route: IM    Site: LUOQ gluteus    Exp Date: 12/2009    Lot #: [id number]    Mfr: Gaylyn Rong    Patient tolerated injection without complications    Given by: Lowella Petties CMA (December 15, 2009 1:23 PM)  Orders Added: 1)  Admin of Therapeutic Inj  intramuscular or subcutaneous [78295]

## 2010-07-19 NOTE — Assessment & Plan Note (Signed)
Summary: GIVE SHOT/DLO  Nurse Visit   Allergies: 1)  * Vioxx (Rofecoxib) 2)  Celebrex (Celecoxib) 3)  * Crestor  Medication Administration  Injection # 1:    Medication: Testosterone Cypionat 200mg  ing    Diagnosis: TESTOSTERONE DEFICIENCY (ICD-257.2)    Route: IM    Site: LUOQ gluteus    Exp Date: 10/15/2009    Lot #: [id number]    Mfr: Gaylyn Rong    Patient tolerated injection without complications    Given by: Sydell Axon LPN (June 22, 2009 10:00 AM)  Orders Added: 1)  Testosterone Cypionat 200mg  ing [J1080] 2)  Admin of Therapeutic Inj  intramuscular or subcutaneous [96372]   Medication Administration  Injection # 1:    Medication: Testosterone Cypionat 200mg  ing    Diagnosis: TESTOSTERONE DEFICIENCY (ICD-257.2)    Route: IM    Site: LUOQ gluteus    Exp Date: 10/15/2009    Lot #: [id number]    Mfr: Gaylyn Rong    Patient tolerated injection without complications    Given by: Sydell Axon LPN (June 22, 2009 10:00 AM)  Orders Added: 1)  Testosterone Cypionat 200mg  ing [J1080] 2)  Admin of Therapeutic Inj  intramuscular or subcutaneous [78295]

## 2010-07-19 NOTE — Letter (Signed)
Summary: [facility]   Imported By: Maryln Gottron 03/26/2010 13:46:16  _____________________________________________________________________  External Attachment:    Type:   Image     Comment:   External Document  Appended Document: [facility]    Clinical Lists Changes

## 2010-07-19 NOTE — Assessment & Plan Note (Signed)
Summary: 6 mon rov 414.01 428.22  pfh,rn   Visit Type:  Follow-up Primary Provider:  Joycelyn Man  CC:  CAD and Cardiomyopathy.  History of Present Illness: The patient presents for followup of the above. Since I last saw him he has had no new cardiovascular complaints. He will get mildly short of breath with activities. However, this has been stable. He does not describe resting shortness of breath and has no PND or orthopnea. Has no palpitations, presyncope or syncope. He has no chest pressure, neck or arm discomfort. He is recovered from his most recent surgery which was to treat a renal cancer (apparent clear cell). He has stable renal function and no evidence of recurrence.   Current Medications (verified): 1)  Delatestryl 200 Mg/ml Oil (Testosterone Enanthate) .... 200mg  Im Monthly. 2)  IT consultant  Strp (Glucose Blood) .... Check Daily  Diagnosis Code 250.00 3)  Tylenol .... As Needed 4)  Lantus For Opticlik 100 Unit/ml Soln (Insulin Glargine) .... 20 Units Subcutaneously Once A Day. 5)  Carvedilol 6.25 Mg Tabs (Carvedilol) .[name].. 1 Twice A Day By Mouth 6)  Bd Insulin Syringe Ultrafine 30g X 1/2" 0.3 Ml Misc (Insulin Syringe-Needle U-100) .... Use With Solostar Pens 7)  Cvs Alcohol Swabs  Pads (Alcohol Swabs) .... As Needed 8)  Colchicine 0.6 Mg Tabs (Colchicine) .... As Needed 9)  Nitrostat 0.4 Mg Subl (Nitroglycerin) .... As Needed 10)  Aspirin 81 Mg Tbec (Aspirin) .... Take One By Mouth Daily 11)  Glucosamine Chondr 1500 Complx  Caps (Glucosamine-Chondroit-Vit C-Mn) .... Take One By Mouth Two Times A Day 12)  Ferrous Sulfate 325 (65 Fe) Mg Tabs (Ferrous Sulfate) .... Take One By Mouth Daily 13)  Vitamin D (Ergocalciferol) 50000 Unit Caps (Ergocalciferol) .[name].. 1 By Mouth Weekly 14)  Fluorouracil 5 % Crea (Fluorouracil) .... 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   Vital Signs:  Patient profile:   77 year old male Height:      72 inches Weight:      222 pounds BMI:     30.22 Pulse rate:   74 / minute Resp:     16 per minute BP sitting:   128 / 78  (right arm)  Vitals Entered By: Marrion Coy, CNA (Oct 24, 2009 11:32 AM)  Physical Exam  General:  Well developed, well nourished, in no acute distress. Thinner but still  overweight. Head:  Normocephalic and atraumatic without obvious abnormalities. No apparent alopecia but male pattern balding. Eyes:  Conjunctiva clear bilaterally.  Mouth:  Teeth, gums and palate normal. Oral mucosa normal. Neck:  Neck supple, no JVD. No masses, thyromegaly or abnormal cervical nodes. Chest Wall:  well-healed sternotomy scar without erythema, drainage, or sternal mobility Lungs:  Normal respiratory effort, chest expands symmetrically. Lungs are clear to auscultation, no crackles or wheezes. Abdomen:  Bowel sounds positive; abdomen soft and non-tender without masses, organomegaly, or hernias noted. No hepatosplenomegaly. Mildly protuberant. Msk:  Back normal, normal gait. Muscle strength and  tone normal. Extremities:  No clubbing or cyanosis. Neurologic:  Alert and oriented x 3. Skin:  Intact without lesions or rashes. Cervical Nodes:  no significant adenopathy Axillary Nodes:  no significant adenopathy Inguinal Nodes:  no significant adenopathy Psych:  Normal affect.   Detailed Cardiovascular Exam  Neck    Carotids: Carotids full and equal bilaterally without bruits.      Neck Veins: Normal, no JVD.    Heart    Inspection: no deformities or lifts noted.      Palpation: normal PMI with no thrills palpable.      Auscultation: regular rate and rhythm, S1, S2 without murmurs, rubs, gallops, or clicks.    Vascular    Abdominal Aorta: no palpable masses, pulsations, or audible bruits.      Femoral Pulses: normal femoral pulses bilaterally.      Pedal Pulses: normal pedal pulses bilaterally.      Radial Pulses: normal radial pulses bilaterally.      Peripheral Circulation: no clubbing, cyanosis, or edema noted with normal capillary refill.     EKG  Procedure date:  10/24/2009  Findings:      sinus rhythm, rate 74, first degree AV block, left bundle branch block.  Impression & Recommendations:  Problem # 1:  CHRONIC SYSTOLIC HEART FAILURE (ICD-428.22) Today I will further titrate his carvedilol to 9.375 mg b.i.d. I'll move slowly with this. He is not going to tolerate ACE and ARB because of renal insufficiency. I might be able to hydralazine and nitrates. However, I suspect that I would not have much in the way of meds I titration. I am going to recheck an echocardiogram. If his ejection fraction is still below 35%, as I suspect, I will refer him for ICD implantation with possible CRT. I have had this discussion and described the procedure in detail to the patient and his wife and they agree to proceed along these lines. Orders: Echocardiogram (Echo)  Problem # 2:  CAD SEVERE (ICD-414.00) No further cardiovascular testing is suggested. He will continue on the meds as  listed. Orders: EKG w/ Interpretation (93000)  Problem # 3:  HYPERLIPIDEMIA (ICD-272.4) We have reviewed the charts and see no contraindication to statins though he was intolerant of Crestor. I will start with pravastatin 40 mg daily. Repeat lipids will be ordered in 10 weeks.  Patient Instructions: 1)  Your physician recommends that you schedule a follow-up appointment in: 3 MONTHS WITH DR [facility] 2)  Your physician has recommended you make the following change in your medication: INCREASE CARVEDILOL TO 9.375 MG TWICE A DAY  3)  Your physician has requested that you have an echocardiogram.  Echocardiography is a painless test that uses sound waves to create images of your heart. It provides your doctor with information about the size and shape of your heart and how well your heart's chambers and valves are working.  This procedure takes approximately one hour. There are no restrictions for this procedure. Prescriptions: CARVEDILOL 6.25 MG TABS (CARVEDILOL) TAKE 1 AND 1/2 TABLETS TWICE A DAY  #90 x 3   Entered by:  Charolotte Capuchin, RN   Authorized by:   [name], MD, [facility]   Signed by:   Charolotte Capuchin, RN on 10/24/2009   Method used:   [facility]        [facility] (retail)       [address]       [place], [place]  [address]       Ph: [phone] or [phone]       Fax: [phone]   RxID:   [id number]

## 2010-07-19 NOTE — Progress Notes (Signed)
Summary: DELATESTRYL  Phone Note [facility]ill Request [id number] on April 24, 2010 4:01 PM  Refills Requested: Medication #1:  DELATESTRYL 200 MG/ML OIL 250mg  IM monthly. Patient was in today for injection. Is this okay to refill? Per Dr. Para March increase from 200 mg to 250 mg.    Method Requested: Electronic Initial call taken by: Melody Comas,  April 24, 2010 4:01 PM  Follow-up for Phone Call        please refill testosterone.   He'll need monthly injections with repeat cmet/cbc/psa/testosterone in 6 months (trough level before that month's injection, which can be done on the same day). Follow-up by: Crawford Givens MD,  April 24, 2010 5:13 PM    Prescriptions: DELATESTRYL 200 MG/ML OIL (TESTOSTERONE ENANTHATE) 250mg  IM monthly.  #71mL x 1   Entered by:   Delilah Shan CMA (AAMA)   Authorized by:   Crawford Givens MD   Signed by:   Delilah Shan CMA (AAMA) on 04/25/2010   Method used:   Handwritten   RxID:   [id number] DELATESTRYL 200 MG/ML OIL (TESTOSTERONE ENANTHATE) 250mg  IM monthly.  #22mL x 1   Entered and Authorized by:   Crawford Givens MD   Signed by:   Crawford Givens MD on 04/24/2010   Method used:   Telephoned to ...       [facility] (retail)       [address]       [place], [place]  [address]       Ph: [phone] or [phone]       Fax: [phone]   RxID:   [id number]  Medication phoned to pharmacy. Lugene Fuquay CMA Duncan Dull)  April 25, 2010 8:35 AM

## 2010-07-19 NOTE — Assessment & Plan Note (Signed)
Summary: TESTOSTERONE  Nurse Visit   Allergies: 1)  * Vioxx (Rofecoxib) 2)  Celebrex (Celecoxib) 3)  * Crestor  Medication Administration  Injection # 1:    Medication: Testosterone Cypionat 200mg  ing    Diagnosis: TESTOSTERONE DEFICIENCY (ICD-257.2)    Route: IM    Site: LUOQ gluteus    Exp Date: 02/15/2012    Lot #: [id number]    Mfr: [employer]    Comments: Patient brought his own medication.      Patient tolerated injection without complications    Given by: Delilah Shan CMA Duncan Dull) (February 21, 2010 11:31 AM)  Orders Added: 1)  Admin of Therapeutic Inj  intramuscular or subcutaneous [96372]    Medication Administration  Injection # 1:    Medication: Testosterone Cypionat 200mg  ing    Diagnosis: TESTOSTERONE DEFICIENCY (ICD-257.2)    Route: IM    Site: LUOQ gluteus    Exp Date: 02/15/2012    Lot #: [id number]    Mfr: [employer]    Comments: Patient brought his own medication.      Patient tolerated injection without complications    Given by: Delilah Shan CMA Duncan Dull) (February 21, 2010 11:31 AM)  Orders Added: 1)  Admin of Therapeutic Inj  intramuscular or subcutaneous [[id number]]

## 2010-07-19 NOTE — Assessment & Plan Note (Signed)
Summary: ER FOLLOW UP/ CONE  30 mins   Vital Signs:  Patient profile:   77 year old male Height:      72 inches Weight:      219.75 pounds BMI:     29.91 Temp:     97.6 degrees F oral Pulse rate:   68 / minute Pulse rhythm:   regular BP sitting:   142 / 80  (left arm) Cuff size:   regular  Vitals Entered By: Linde Gillis CMA Duncan Dull) (May 02, 2010 3:15 PM) CC: ER follow up   History of Present Illness: diaphoretic episode.  Sx resolved.  ER recs reviewed.  Possible vagal vs. extra dose of betablocker.  Now on lower dose of BB and feeling well.  Color improved and bake to baseline except for LLQ pain.   H/o diverticulitis.  Stool heme neg in ER.  In meantime, stools back to normal but with prev BRBPR.  LLQ pain not totally resolved, but improved. No fevers.  No vomiting.    Allergies: 1)  ! Nsaids 2)  * Vioxx (Rofecoxib) 3)  * Crestor 4)  Celebrex (Celecoxib)  Past History:  Past Medical History: Last updated: 04/18/2010 Hypertension (06/17/1981) Diverticulosis, colon (06/18/1983) Hyperlipidemia (06/17/1993) Diabetes mellitus, type II (06/17/1997) Renal Insufficiency- Dr. 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 Luisa Hart L renal CA s/p partial nephrectomy 2010 per Dr. Laverle Patter ICD/CRT (LV lead disabled)  (Medtronic [id number])  Past Surgical History: Last updated: 01/26/2009 CABG (eft internal  mammaryartery graft to the left anterior descending coronary artery, with a saphenous vein graft to the first diagonal branch of the LAD, a saphenous vein graft to the obtuse marginal branch of the leftcircumflex coronary artery, and a saphenous vein graft to the posterior descending branch of the right coronary artery.  Dr, Laneta Simmers 09/05/2008) Choleycystectomy  1970's Bowel obstruction, small mid 80's (laparotomy), 09/12/2000 (no surgery), 10/20/2008 (no surgery) Lesion removal RLE 11/14/2006 (Dr Lindie Spruce)  Partial left Nephrectomy Clear Cell Renal Cell Carcinoma 01/09/2009  Review of Systems       See HPI.  Otherwise negative.    Physical Exam  General:  NAD, A&O MMM RRR CTAB abdominal exam with mild tenderness in LLQ, no rebound, pos BS   Impression & Recommendations:  Problem # 1:  DIAPHORESIS (ICD-780.8) >25 min spent with patient, at least half of which was spent on counseling.  It may have been a vagal episode or related to the BB.  His coreg was decreased and I didn't change the dose today. I would continue as is until follow up with cards.  He agrees.  I appreciate cards help with this patient.   Problem # 2:  BLOOD IN STOOL (ICD-578.1) Likley flare of diverticulitis.  I gave him an rx for augmentin to use if symptoms don't fully resolve. He is improving now. He'll call back with update as needed. I didn't want to put patient on antibiotics and risk  associated symptoms if it wasn't necessary.   Complete Medication List: 1)  Delatestryl 200 Mg/ml Oil (Testosterone enanthate) .... 250mg  im monthly. 2)  IT consultant Strp (Glucose blood) .... Check daily  diagnosis code 250.00 3)  Tylenol  .... As needed (2-3 daily) 4)  Lantus For Opticlik 100 Unit/ml Soln (Insulin glargine) .... 20 units subcutaneously once a day. 5)  Cvs Alcohol Swabs Pads (Alcohol swabs) .... As needed 6)  Nitrostat 0.4 Mg Subl (Nitroglycerin) .... As needed (none taken in last year) 7)  Aspirin 81 Mg Tbec  (Aspirin) .... Take one by mouth daily 8)  Glucosamine Chondr 1500 Complx Caps (Glucosamine-chondroit-vit c-mn) .... Take one by mouth two times a day 9)  Pravastatin Sodium 40 Mg Tabs (Pravastatin sodium) .... One at bedtime 10)  Bd Pen Needle Mini U/f 31g X 5 Mm Misc (Insulin pen needle) .... Use as directed 11)  Glucerna Shake Liqd (Nutritional supplements) .... 4 oz. per day with breakfast 12)  Vitamin D 1000 Unit Caps (Cholecalciferol) .... One by mouth daily 13)  Carvedilol 6.25 Mg Tabs (Carvedilol) .... One  tablet twice a day 14)  Augmentin 500-125 Mg Tabs (Amoxicillin-pot clavulanate) .[name].. 1 by mouth two times a day  Patient Instructions: 1)  Hold onto the rx for augmentin.  If the pain in your lower abdomen gets worse or if you start passing blood again, start the medicine and let us know.  Take care.  Don't change your meds otherwise.  Prescriptions: AUGMENTIN 500-125 MG TABS (AMOXICILLIN-POT CLAVULANATE) 1 by mouth two times a day  #20 x 0   Entered and Authorized by:   Crawford Givens MD   Signed by:   Crawford Givens MD on 05/02/2010   Method used:   Print then Give to Patient   RxID:   [id number]    Orders Added: 1)  Est. Patient Level IV [28413]    Current Allergies (reviewed today): ! NSAIDS * VIOXX (ROFECOXIB) * CRESTOR CELEBREX (CELECOXIB)

## 2010-07-19 NOTE — Assessment & Plan Note (Signed)
Summary: FOLLOW UP / LFW   Vital Signs:  Patient profile:   77 year old male Weight:      219.25 pounds Temp:     97.7 degrees F oral Pulse rate:   68 / minute Pulse rhythm:   regular BP sitting:   118 / 64  (left arm) Cuff size:   large  Vitals Entered By: Sydell Axon LPN (October 09, 2009 10:29 AM) CC: 4 month follow-up   History of Present Illness: Pt here for 4 mo followup.e is gulity of cheating with sweet tea and such. Takes coffee without sugar or cream and did so prior to his labs. He takes Arthritis Tyl at night and Tyl ES one in AM. Told him he can take 2 Tyl ES two times a day and take his Arthtitis at night.  He takes Glucerna "for his diabetes."  Problems Prior to Update: 1)  Renal Cell Cancer  (ICD-189.0) 2)  Chronic Systolic Heart Failure  (ICD-428.22) 3)  Renal Disease, Chronic, Mild  (ICD-585.2) 4)  Ischemic Cardiomyopathy Ef 25%  (ICD-414.8) 5)  Cad Severe  (ICD-414.00) 6)  Gastrointestinal Xray, Abnormal  (ICD-793.4) 7)  Gerd  (ICD-530.81) 8)  Cystic Lesion Left Kidney, Ultrasound  (ICD-793.5) 9)  ? of Acute Pancreatitis  (ICD-577.0) 10)  Irritable Bowel Syndrome  (ICD-564.1) 11)  Degenerative Joint Disease  (ICD-715.90) 12)  Gastroenteritis  (ICD-558.9) 13)  Obesity  (ICD-278.00) 14)  Gout, Great Toe/l Ankle  (ICD-274.0) 15)  Arthropathy Nos, Multiple Sites  (ICD-716.99) 16)  Testosterone Deficiency  (ICD-257.2) 17)  Peptic Ulcer Disease, Helicobacter Pylori Positive  (ICD-533.90) 18)  Diabetes Mellitus, Type II  (ICD-250.00) 19)  Hyperlipidemia  (ICD-272.4) 20)  Adrenal Mass F/u Mult Times, No Longer Required  (ICD-255.8) 21)  Prostatitis, Hx of  (ICD-V13.09) 22)  Arthropathy Nos, Unspecified Site  (ICD-716.90) 23)  Hypertension  (ICD-401.9) 24)  Pud, Hx of  (ICD-V12.71) 25)  Neop, Malignant, Skin, Upper Limb Bcc  (ICD-173.6) 26)  Small Bowel Obstruction,init 80's Recurr 3/02  (ICD-560.9)  Medications Prior to Update: 1)  Delatestryl 200  Mg/ml Oil (Testosterone Enanthate) .... 200mg  Im Monthly. 2)  IT consultant  Strp (Glucose Blood) .... Check Daily  Diagnosis Code 250.00 3)  Tylenol .... As Needed 4)  Lantus For Opticlik 100 Unit/ml Soln (Insulin Glargine) .... 20 Units Subcutaneously Once A Day. 5)  Carvedilol 6.25 Mg Tabs (Carvedilol) .[name].. 1 Twice A Day By Mouth 6)  Bd Insulin Syringe Ultrafine 30g X 1/2" 0.3 Ml Misc (Insulin Syringe-Needle U-100) .... Use With Solostar Pens 7)  Cvs Alcohol Swabs  Pads (Alcohol Swabs) .... As Needed 8)  Glucosamine-Chondroitin 1500-1200 Mg/57ml Liqd (Glucosamine-Chondroitin) .... Take One By Mouth Daily 9)  Colchicine 0.6 Mg Tabs (Colchicine) .... As Needed 10)  Nitrostat 0.4 Mg Subl (Nitroglycerin) .... As Needed 11)  Aspirin 81 Mg Tbec (Aspirin) .... Take One By Mouth Daily 12)  Glucosamine Chondr 1500 Complx  Caps (Glucosamine-Chondroit-Vit C-Mn) .... Take One By Mouth Two Times A Day  Allergies: 1)  * Vioxx (Rofecoxib) 2)  Celebrex (Celecoxib) 3)  * Crestor  Physical Exam  General:  Well developed, well nourished, in no acute distress. Thinner but still  overweight. Head:  Normocephalic and atraumatic without obvious abnormalities. No apparent alopecia but male pattern balding. Eyes:  Conjunctiva clear bilaterally.  Ears:  External ear exam shows no significant lesions or deformities.  Otoscopic examination reveals clear canals, tympanic membranes are intact bilaterally without bulging, retraction, inflammation or discharge.  Hearing is grossly normal bilaterally. Nose:  External nasal examination shows no deformity or inflammation. Nasal mucosa are pink and moist without lesions or exudates. Mouth:  Teeth, gums and palate normal. Oral mucosa normal. Neck:  Neck supple, no JVD. No masses, thyromegaly or abnormal cervical nodes. Chest Wall:  well-healed sternotomy scar without erythema, drainage, or sternal mobility Lungs:  Normal respiratory effort, chest expands  symmetrically. Lungs are clear to auscultation, no crackles or wheezes. Heart:  Normal rate and regular rhythm. S1 and S2 normal without gallop, murmur, click, rub or other extra sounds.   Impression & Recommendations:  Problem # 1:  RENAL DISEASE, CHRONIC, MILD (ICD-585.2) Assessment Deteriorated Cr up to 3.1 from 2.4...absolutely needs to improve his diabetes control and cannot afford to "cheat" as it raises the risk of increasing insulin to cover the worsening nos and then have him watch his diet and have lows. I'm not sure either his wife of himself understands the gravity of the situation but I tried to explain that. I think he was motivated to improve when he left but I'm not sure. He needs to be seen during the summer and I will not be here. Will have him establish with Dr Para March.  Problem # 2:  DIABETES MELLITUS, TYPE II (ICD-250.00) Assessment: Improved  His A1C has improved in the last 4 mos from 9.1 to 8.4 but still too high. Dare not increase LAntus at this stage. Try to become more compliant or at least more consistent so can cover better. Acute FBS 141 when drawn. His updated medication list for this problem includes:    Lantus For Opticlik 100 Unit/ml Soln (Insulin glargine) .[name]... 20 units subcutaneously once a day.    Aspirin 81 Mg Tbec (Aspirin) .[name]... Take one by mouth daily  Labs Reviewed: Creat: 3.1 (10/04/2009)   Microalbumin: 18.7 (08/15/2004)  Last Eye Exam: normal (06/21/2008) Reviewed HgBA1c results: 8.4 (10/04/2009)  9.1 (05/29/2009)  Problem # 3:  HYPERTENSION (ICD-401.9) Assessment: Unchanged Great control. Cont curr meds. This is not the reason for Cr incr. His updated medication list for this problem includes:    Carvedilol 6.25 Mg Tabs (Carvedilol) .[name]... 1 twice a day by mouth  BP today: 118/64 Prior BP: 120/70 (06/05/2009)  Labs Reviewed: K+: 5.3 (10/04/2009) Creat: : 3.1 (10/04/2009)   Chol: 223 (02/05/2008)   HDL: 33.9 (02/05/2008)   LDL: DEL  (02/05/2008)   TG: 220 (02/05/2008)  Complete Medication List: 1)  Delatestryl 200 Mg/ml Oil (Testosterone enanthate) .... 200mg  im monthly. 2)  IT consultant Strp (Glucose blood) .... Check daily  diagnosis code 250.00 3)  Tylenol  .... As needed 4)  Lantus For Opticlik 100 Unit/ml Soln (Insulin glargine) .... 20 units subcutaneously once a day. 5)  Carvedilol 6.25 Mg Tabs (Carvedilol) .[name].. 1 twice a day by mouth 6)  Bd Insulin Syringe Ultrafine 30g X 1/2" 0.3 Ml Misc (Insulin syringe-needle u-100) .... Use with solostar pens 7)  Cvs Alcohol Swabs Pads (Alcohol swabs) .... As needed 8)  Colchicine 0.6 Mg Tabs (Colchicine) .... As needed 9)  Nitrostat 0.4 Mg Subl (Nitroglycerin) .... As needed 10)  Aspirin 81 Mg Tbec (Aspirin) .... Take one by mouth daily 11)  Glucosamine Chondr 1500 Complx Caps (Glucosamine-chondroit-vit c-mn) .... Take one by mouth two times a day 12)  Ferrous Sulfate 325 (65 Fe) Mg Tabs (Ferrous sulfate) .... Take one by mouth daily 13)  Vitamin D (ergocalciferol) 50000 Unit Caps (Ergocalciferol) .... Take one by mouth monthly 14)  Fluorouracil 5 % Crea (Fluorouracil) .... Apply to face two times a day  Other Orders: Testosterone Cypionat 200mg  ing (E4540) Admin of Therapeutic Inj  intramuscular or subcutaneous (98119)  Patient Instructions: 1)  RTC 7/09 With Raechel Ache. Call in late May for appt. 2)  Bring in Glucerna to assess risk with DM. 3)  Needs eye exam. 4)  30 mins spent with pt and wife.  Current Allergies (reviewed today): * VIOXX (ROFECOXIB) CELEBREX (CELECOXIB) * CRESTOR   Medication Administration  Injection # 1:    Medication: Testosterone Cypionat 200mg  ing    Diagnosis: TESTOSTERONE DEFICIENCY (ICD-257.2)    Route: IM    Site: RUOQ gluteus    Exp Date: 12/15/2009    Lot #: [id number]    Mfr: Gaylyn Rong    Patient tolerated injection without complications    Given by: Sydell Axon LPN (October 09, 2009 10:48 AM)  Orders Added: 1)   Testosterone Cypionat 200mg  ing [J1080] 2)  Admin of Therapeutic Inj  intramuscular or subcutaneous [96372] 3)  Est. Patient Level IV [56213]   Appended Document: FOLLOW UP / LFW Vit D deficient, start Vit D replacement 1000Iu two times a day and discuss with Dr Allena Katz when seen.   Clinical Lists Changes  Problems: Added new problem of VITAMIN D DEFICIENCY (ICD-268.9)

## 2010-07-19 NOTE — Letter (Signed)
Summary: [facility] Office Note  [facility] Office Note   Imported By: Roderic Ovens 11/14/2009 11:08:42  _____________________________________________________________________  External Attachment:    Type:   Image     Comment:   External Document

## 2010-07-19 NOTE — Assessment & Plan Note (Signed)
Summary: shot/rbh pt have meds with him  Nurse Visit   Allergies: 1)  * Vioxx (Rofecoxib) 2)  Celebrex (Celecoxib) 3)  * Crestor  Medication Administration  Injection # 1:    Medication: Testosterone Cypionat 200mg  ing    Diagnosis: TESTOSTERONE DEFICIENCY (ICD-257.2)    Route: IM    Site: RUOQ gluteus    Exp Date: 12/2009    Lot #: [id number]    Comments: Pt has his own medicine.    Patient tolerated injection without complications    Given by: Lowella Petties CMA (Nov 09, 2009 4:32 PM)  Orders Added: 1)  Admin of Therapeutic Inj  intramuscular or subcutaneous [96372]   Medication Administration  Injection # 1:    Medication: Testosterone Cypionat 200mg  ing    Diagnosis: TESTOSTERONE DEFICIENCY (ICD-257.2)    Route: IM    Site: RUOQ gluteus    Exp Date: 12/2009    Lot #: [id number]    Comments: Pt has his own medicine.    Patient tolerated injection without complications    Given by: Lowella Petties CMA (Nov 09, 2009 4:32 PM)  Orders Added: 1)  Admin of Therapeutic Inj  intramuscular or subcutaneous [81191]

## 2010-07-20 NOTE — Letter (Signed)
Summary: [facility]   [facility]   Imported By: Roderic Ovens 07/17/2009 15:46:18  _____________________________________________________________________  External Attachment:    Type:   Image     Comment:   External Document

## 2010-07-20 NOTE — Letter (Signed)
Summary: [facility] / RENAL CELL CARCINOMA / DR. Konrad Dolores BORDEN  [facility] / RENAL CELL CARCINOMA / DR. Heloise Purpura   Imported By: Carin Primrose 08/01/2009 15:04:50  _____________________________________________________________________  External Attachment:    Type:   Image     Comment:   External Document

## 2010-07-24 ENCOUNTER — Telehealth: Payer: Self-pay | Admitting: Internal Medicine

## 2010-07-25 ENCOUNTER — Ambulatory Visit (INDEPENDENT_AMBULATORY_CARE_PROVIDER_SITE_OTHER): Payer: MEDICARE

## 2010-07-25 ENCOUNTER — Encounter: Payer: Self-pay | Admitting: Family Medicine

## 2010-07-25 DIAGNOSIS — E291 Testicular hypofunction: Secondary | ICD-10-CM

## 2010-07-25 NOTE — Assessment & Plan Note (Signed)
Summary: pc2/ gd   Visit Type:  Follow-up Primary Provider:  Joycelyn Man   History of Present Illness: Mr. Stecher returns today for followup. He is a pleasant 77 yo man with an ICM, s/p BiV ICD several months ago. He developed diaphragmatic stimulation after his procedure and his LV lead was turned off. He has been stable. He denies c/p. His activity is stable and he has had no additional ICD shocks. His main limitation is pain in his knees.  Current Medications (verified): 1)  Delatestryl 200 Mg/ml Oil (Testosterone Enanthate) .... 250mg  Im Monthly. 2)  IT consultant  Strp (Glucose Blood) .... Check Daily  Diagnosis Code 250.00 3)  Tylenol .... As Needed (2-3 Daily) 4)  Lantus For Opticlik 100 Unit/ml Soln (Insulin Glargine) .... 20 Units Subcutaneously Once A Day. 5)  Cvs Alcohol Swabs  Pads (Alcohol Swabs) .... As Needed 6)  Nitrostat 0.4 Mg Subl (Nitroglycerin) .... As Needed (None Taken in Last Year) 7)  Aspirin 81 Mg Tbec (Aspirin) .... Take One By Mouth Daily 8)  Glucosamine Chondr 1500 Complx  Caps (Glucosamine-Chondroit-Vit C-Mn) .... Take One By Mouth Two Times A Day 9)  Pravastatin Sodium 40 Mg Tabs (Pravastatin Sodium) .... One At Bedtime 10)  Bd Pen Needle Mini U/f 31g X 5 Mm Misc (Insulin Pen Needle) .... Use As Directed 11)  Glucerna Shake  Liqd (Nutritional Supplements) .... 4 Oz. Per Day With Breakfast 12)  Vitamin D 1000 Unit Caps (Cholecalciferol) .... 2  By Mouth Daily 13)  Carvedilol 6.25 Mg Tabs (Carvedilol) .... One  Tablet Twice A Day  Allergies: 1)  ! Nsaids 2)  * Vioxx (Rofecoxib) 3)  * Crestor 4)  Celebrex (Celecoxib)  Past History:  Past Medical History: Last updated: 04/18/2010 Hypertension (06/17/1981) Diverticulosis, colon (06/18/1983) Hyperlipidemia (06/17/1993) Diabetes mellitus, type II (06/17/1997) Renal Insufficiency- Dr. Allena Katz with renal Peptic Ulcer Disease 1960's CT Abd fatty process unchgd  myelolipoma adrenal unchgd  06/1997 CT Abd Adrenal mass unchgd no further f/u reqd 02/99 HOSP SBO 08/2000 Colonoscopy 4mm Sigmoid Polyp Diverticuosis (Dr Leone Payor) 12/29/2007   No repeat HOSP CP Pancreatitis  1/24-1/27/2010 EGD Schatkzki Ring  2cm HH  (Dr Arlyce Dice) 07/12/2008 ABD CT  fatty liver early pancreatitis  07/12/2008  Pelvic CT Nml 07/12/2008 Abd U/S Fatty infiltr liver complex cyst in lower pole left kidney 07/12/2008 HOSP/CATH (08/17/08) Sev 3 Vess CAD Isch  cardiomyopathy EF 25% Ant Akinesis Global Hypo 3/2-08/18/08 Thyroid nodule per Dr. Luisa Hart L renal CA s/p partial nephrectomy 2010 per Dr. Laverle Patter ICD/CRT (LV lead disabled)  (Medtronic [id number] H)  Past Surgical History: Last updated: 01/26/2009 CABG (eft internal mammaryartery graft to the left anterior descending coronary artery, with a saphenous vein graft to the first diagonal branch of the LAD, a saphenous vein graft to the obtuse marginal branch of the leftcircumflex coronary artery, and a saphenous vein graft to the posterior descending branch of the right coronary artery.  Dr, Laneta Simmers 09/05/2008) Choleycystectomy  1970's Bowel obstruction, small mid 80's (laparotomy), 09/12/2000 (no surgery), 10/20/2008 (no surgery) Lesion removal RLE 11/14/2006 (Dr Lindie Spruce)  Partial left Nephrectomy Clear Cell Renal Cell Carcinoma 01/09/2009  Review of Systems       The patient complains of dyspnea on exertion.  The patient denies chest pain, syncope, and peripheral edema.    Vital Signs:  Patient profile:   77 year old male Height:      72 inches Weight:      224 pounds BMI:     30.49  Pulse rate:   68 / minute BP sitting:   122 / 70  (right arm)  Vitals Entered By: Laurance Flatten CMA (July 17, 2010 2:53 PM)  Physical Exam  General:  Well developed, well nourished, in no acute distress. Head:  normocephalic and atraumatic Eyes:  PERRLA/EOM intact; conjunctiva and lids normal. Mouth:  Oral mucosa and oropharynx without lesions or exudates.  Teeth in good  repair. Neck:  Neck supple, no JVD. No masses, thyromegaly or abnormal cervical nodes. Chest Wall:  Well-healed pacemaker pocket and sternotomy scar Lungs:  Clear bilaterally to auscultation with no wheezes, rales, or rhonchi. Heart:  Normal rate and regular rhythm. S1 and S2 normal without gallop, murmur, click, rub or other extra sounds. Abdomen:  Bowel sounds positive; abdomen soft and non-tender without masses, organomegaly, or hernias noted. No hepatosplenomegaly, obese Msk:  Back normal, normal gait. Muscle strength and tone normal. Pulses:  R and L posterior tibial pulses are full and equal bilaterally  Extremities:  mild bilateral lower extremity edema Neurologic:  Alert and oriented x 3.    ICD Specifications Following MD:  Lewayne Bunting, MD     ICD Vendor:  Medtronic     ICD Model Number:  [id number]     ICD Serial Number:  [id number] ICD DOI:  04/10/2010     ICD Implanting MD:  Lewayne Bunting, MD  Lead 1:    Location: RA     DOI: 04/10/2010     Model #: [id number]     Serial #: [id number]     Status: active Lead 2:    Location: RV     DOI: 04/10/2010     Model #: [id number]     Serial #: [id number]     Status: active Lead 3:    Location: LV     DOI: 04/10/2010     Model #: [id number]     Serial #: [id number]     Status: active  Indications::  CM   ICD Follow Up Battery Voltage:  3.20 V     Charge Time:  8.5 seconds     Underlying rhythm:  SR @ 66   ICD Device Measurements Atrium:  Amplitude: 2.8 mV, Impedance: 532 ohms, Threshold: 0.75 V at 0.40 msec Right Ventricle:  Amplitude: 4.8 mV, Impedance: 380 ohms, Threshold: 1.50 V at 0.40 msec Left Ventricle:  Impedance: 532 ohms,  Shock Impedance: 46/65 ohms   Episodes MS Episodes:  0     Coumadin:  No Shock:  0     ATP:  0     Nonsustained:  0     Atrial Therapies:  0 Atrial Pacing:  0.1%     Ventricular Pacing:  100%  Brady Parameters Mode DDD     Lower Rate Limit:  50     Upper Rate Limit 130 PAV 200     Sensed AV Delay:  180  Tachy  Zones VF:  200     VT:  171     Next Remote Date:  10/18/2010     Next Cardiology Appt Due:  06/18/2011 Tech Comments:  NORMAL DEVICE FUNCTION.  NO EPISODES RECORDED SINCE LAST CHECK.  CHANGED RA OUTPUT FROM 3.50 TO 2.00 AND RV OUTPUT FROM 3.75 TO 3.00 V. TURNED LV LEAD ON WITH LV OUTPUT 1.00 @ 1.150ms. TURNED LV CAPTURE MANAGEMENT TO MONITOR.  DEMONSTRATED TONE FOR PT AND PT AWARE TO CALL IF TONE HEARD.  PT WOULD LIKE TO BE ENROLLED IN [facility]--CARELINK TRANSMISSION 10-18-10 AND  ROV IN 12 MTHS W/GT. Vella Kohler  July 17, 2010 3:11 PM MD Comments:  Agree with above. He is not having diaphragmatic stimulation at 3 volts and I have turned him down to 1 volt.  Impression & Recommendations:  Problem # 1:  CHRONIC SYSTOLIC HEART FAILURE (ICD-428.22) He remains class 2. He will continue his current meds, maintain a low sodium diet and we have maintained a low sodium diet. His updated medication list for this problem includes:    Nitrostat 0.4 Mg Subl (Nitroglycerin) .[name]... As needed (none taken in last year)    Aspirin 81 Mg Tbec (Aspirin) .[name]... Take one by mouth daily    Carvedilol 6.25 Mg Tabs (Carvedilol) ..... One  tablet twice a day  Problem # 2:  AUTOMATIC IMPLANTABLE CARDIAC DEFIBRILLATOR SITU (ICD-V45.02) His device was reprogrammed today. He has had his device reprogrammed to allow for LV pacing. Hopefully at low output he will not have diagphragmatic stimulation.  Patient Instructions: 1)  Your physician wants you to follow-up in:12 months with Dr Court Joy will receive a reminder letter in the mail two months in advance. If you don't receive a letter, please call our office to schedule the follow-up appointment. 2)  [facility] 10/18/2010 3)  Your physician recommends that you continue on your current medications as directed. Please refer to the Current Medication list given to you today.

## 2010-08-02 NOTE — Progress Notes (Signed)
Summary: question on his defib  Phone Note Call from Patient Call back at Home Phone [phone]   Caller: Patient Reason for Call: Talk to Nurse Summary of Call: pt thinks he is having problem with defib pt is not sure. pt does not want to talk to device clinic he wants to talk to [facility].  Initial call taken by: Roe Coombs,  July 24, 2010 11:01 AM  Follow-up for Phone Call        pt called to report that he had a couple of epidsodes of diap stem  but he feels he wants to live with it because his breathing is somewhat better.  Dr. Ladona Ridgel aware and says occasional stem is okay   Dennis Bast, RN, BSN  July 24, 2010 11:16 AM

## 2010-08-02 NOTE — Cardiovascular Report (Signed)
Summary: Office Visit   Office Visit   Imported By: Roderic Ovens 07/27/2010 10:59:05  _____________________________________________________________________  External Attachment:    Type:   Image     Comment:   External Document

## 2010-08-02 NOTE — Assessment & Plan Note (Signed)
Summary: testosterone/alc  Nurse Visit   Allergies: 1)  ! Nsaids 2)  * Vioxx (Rofecoxib) 3)  * Crestor 4)  Celebrex (Celecoxib)  Medication Administration  Injection # 1:    Medication: Testosterone Cypionat 200mg  ing    Diagnosis: TESTOSTERONE DEFICIENCY (ICD-257.2)    Route: IM    Site: LUOQ gluteus    Exp Date: 01/16/2012    Lot #: [id number]    Mfr: Claudette Laws    Patient tolerated injection without complications    Given by: Linde Gillis CMA (AAMA) (July 25, 2010 2:30 PM)  Orders Added: 1)  Testosterone Cypionat 200mg  ing [J1080] 2)  Admin of patients own med IM/SQ [[id number]]

## 2010-08-08 ENCOUNTER — Ambulatory Visit (HOSPITAL_COMMUNITY)
Admission: RE | Admit: 2010-08-08 | Discharge: 2010-08-08 | Disposition: A | Discharge status: Home / Self Care | Payer: MEDICARE | Source: Ambulatory Visit | Attending: Urology | Admitting: Urology

## 2010-08-08 ENCOUNTER — Encounter: Payer: Self-pay | Admitting: Family Medicine

## 2010-08-08 ENCOUNTER — Other Ambulatory Visit (HOSPITAL_COMMUNITY): Payer: Self-pay | Admitting: Urology

## 2010-08-08 DIAGNOSIS — Z85528 Personal history of other malignant neoplasm of kidney: Secondary | ICD-10-CM | POA: Insufficient documentation

## 2010-08-08 DIAGNOSIS — I517 Cardiomegaly: Secondary | ICD-10-CM | POA: Insufficient documentation

## 2010-08-08 DIAGNOSIS — E119 Type 2 diabetes mellitus without complications: Secondary | ICD-10-CM | POA: Insufficient documentation

## 2010-08-08 DIAGNOSIS — Z950 Presence of cardiac pacemaker: Secondary | ICD-10-CM | POA: Insufficient documentation

## 2010-08-16 ENCOUNTER — Encounter: Payer: Self-pay | Admitting: Cardiology

## 2010-08-16 ENCOUNTER — Ambulatory Visit (INDEPENDENT_AMBULATORY_CARE_PROVIDER_SITE_OTHER): Payer: MEDICARE | Admitting: Cardiology

## 2010-08-16 DIAGNOSIS — I5022 Chronic systolic (congestive) heart failure: Secondary | ICD-10-CM

## 2010-08-23 NOTE — Letter (Signed)
Summary: [facility]   [facility]   Imported By: Kassie Mends 08/14/2010 10:31:45  _____________________________________________________________________  External Attachment:    Type:   Image     Comment:   External Document  Appended Document: [facility]     Clinical Lists Changes

## 2010-08-28 ENCOUNTER — Ambulatory Visit (INDEPENDENT_AMBULATORY_CARE_PROVIDER_SITE_OTHER): Payer: 59

## 2010-08-28 ENCOUNTER — Encounter: Payer: Self-pay | Admitting: Family Medicine

## 2010-08-28 DIAGNOSIS — E291 Testicular hypofunction: Secondary | ICD-10-CM

## 2010-08-28 LAB — COMPREHENSIVE METABOLIC PANEL
AST: 22 U/L (ref 0–37)
Albumin: 3.8 g/dL (ref 3.5–5.2)
Calcium: 8.8 mg/dL (ref 8.4–10.5)
Creatinine, Ser: 2.34 mg/dL — ABNORMAL HIGH (ref 0.4–1.5)
GFR calc Af Amer: 32 mL/min — ABNORMAL LOW (ref >60)
Total Protein: 7 g/dL (ref 6.0–8.3)

## 2010-08-28 LAB — URINALYSIS, ROUTINE W REFLEX MICROSCOPIC
Hgb urine dipstick: NEGATIVE
Ketones, ur: NEGATIVE mg/dL
Protein, ur: 30 mg/dL — AB
Urobilinogen, UA: 0.2 mg/dL (ref 0.0–1.0)

## 2010-08-28 LAB — URINE MICROSCOPIC-ADD ON

## 2010-08-28 LAB — POCT CARDIAC MARKERS
CKMB, poc: 2.1 ng/mL (ref 1.0–8.0)
Myoglobin, poc: 232 ng/mL (ref 12–200)

## 2010-08-28 LAB — CBC
HCT: 40.5 % (ref 39.0–52.0)
Hemoglobin: 12.8 g/dL — ABNORMAL LOW (ref 13.0–17.0)
MCHC: 31.6 g/dL (ref 30.0–36.0)
MCV: 88 fL (ref 78.0–100.0)
WBC: 9.1 10*3/uL (ref 4.0–10.5)

## 2010-08-28 LAB — POCT I-STAT, CHEM 8
BUN: 31 mg/dL — ABNORMAL HIGH (ref 6–23)
Creatinine, Ser: 2.4 mg/dL — ABNORMAL HIGH (ref 0.4–1.5)
Hemoglobin: 14.6 g/dL (ref 13.0–17.0)
Potassium: 5.1 mEq/L (ref 3.5–5.1)
Sodium: 139 mEq/L (ref 135–145)

## 2010-08-28 LAB — DIFFERENTIAL
Eosinophils Relative: 2 % (ref 0–5)
Lymphocytes Relative: 9 % — ABNORMAL LOW (ref 12–46)
Lymphs Abs: 0.8 10*3/uL (ref 0.7–4.0)
Monocytes Absolute: 0.8 10*3/uL (ref 0.1–1.0)

## 2010-08-28 LAB — HEMOCCULT GUIAC POC 1CARD (OFFICE): Fecal Occult Bld: NEGATIVE

## 2010-08-28 NOTE — Assessment & Plan Note (Signed)
Summary: 3 month rov.sl/sp   Visit Type:  Follow-up Primary Provider:  Joycelyn Man  CC:  Dyspnea.  History of Present Illness: The patient presents for follow up of known cardiomyopathy. We are trying to titrate his meds but he had a syncopal episode on 12.5 b.i.d of Coreg. Since he's been on 6.25 b.i.d. spelled well. He has had no presyncope or syncope. He has had no chest pressure, neck or arm discomfort. He has had no shortness of breath, weight gain or edema.  Current Medications (verified): 1)  Delatestryl 200 Mg/ml Oil (Testosterone Enanthate) .... 250mg  Im Monthly. 2)  IT consultant  Strp (Glucose Blood) .... Check Daily  Diagnosis Code 250.00 3)  Tylenol 325 Mg Tabs (Acetaminophen) .... As Needed 4)  Lantus For Opticlik 100 Unit/ml Soln (Insulin Glargine) .... 20 Units Subcutaneously Once A Day. 5)  Cvs Alcohol Swabs  Pads (Alcohol Swabs) .... As Needed 6)  Nitrostat 0.4 Mg Subl (Nitroglycerin) .... As Needed (None Taken in Last Year) 7)  Aspirin 81 Mg Tbec (Aspirin) .... Take One By Mouth Daily 8)  Glucosamine Chondr 1500 Complx  Caps (Glucosamine-Chondroit-Vit C-Mn) .... Take One By Mouth Two Times A Day 9)  Pravastatin Sodium 40 Mg Tabs (Pravastatin Sodium) .... One At Bedtime 10)  Bd Pen Needle Mini U/f 31g X 5 Mm Misc (Insulin Pen Needle) .... Use As Directed 11)  Glucerna Shake  Liqd (Nutritional Supplements) .... 4 Oz. Per Day With Breakfast 12)  Vitamin D 1000 Unit Caps (Cholecalciferol) .... 2  By Mouth Daily 13)  Carvedilol 6.25 Mg Tabs (Carvedilol) .... One  Tablet Twice A Day 14)  Lisinopril 2.5 Mg Tabs (Lisinopril) .... Take One Tablet By Mouth Daily 15)  Aleve 220 Mg Tabs (Naproxen Sodium) .... As Needed  Allergies: 1)  ! Nsaids 2)  * Vioxx (Rofecoxib) 3)  * Crestor 4)  Celebrex (Celecoxib)  Past History:  Past Medical History: Reviewed history from 04/18/2010 and no changes required. Hypertension (06/17/1981) Diverticulosis, colon  (06/18/1983) Hyperlipidemia (06/17/1993) Diabetes mellitus, type II (06/17/1997) Renal Insufficiency- Dr. 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 Luisa Hart L renal CA s/p partial nephrectomy 2010 per Dr. Laverle Patter ICD/CRT (LV lead disabled)  (Medtronic [id number])  Past Surgical History: Reviewed history from 01/26/2009 and no changes required. CABG (eft internal mammaryartery graft to the left anterior descending coronary artery, with a saphenous vein graft to the first diagonal branch of the LAD, a saphenous vein graft to the obtuse marginal branch of the leftcircumflex coronary artery, and a saphenous vein graft to the posterior descending branch of the right coronary artery.  Dr, Laneta Simmers 09/05/2008) Choleycystectomy  1970's Bowel obstruction, small mid 80's (laparotomy), 09/12/2000 (no surgery), 10/20/2008 (no surgery) Lesion removal RLE 11/14/2006 (Dr Lindie Spruce)  Partial left Nephrectomy Clear Cell Renal Cell Carcinoma 01/09/2009  Review of Systems       As stated in the HPI and negative for all other systems.  Vital Signs:  Patient profile:   77 year old male Height:      72 inches Weight:      224.50 pounds BMI:     30.56 Pulse rate:   58 / minute Pulse rhythm:   irregular Resp:     18 per minute  Vitals Entered By: Vikki Ports (August 16, 2010 9:04 AM)  Physical Exam  General:  Well developed, well nourished,  in no acute distress. Head:  normocephalic and atraumatic Eyes:  PERRLA/EOM intact; conjunctiva and lids normal. Mouth:  Oral mucosa and oropharynx without lesions or exudates.  Teeth in good repair. Neck:  Neck supple, no JVD. No masses, thyromegaly or abnormal cervical nodes. Chest Wall:  Well-healed pacemaker pocket and sternotomy scar Lungs:  Clear bilaterally to auscultation with no wheezes, rales, or rhonchi. Heart:  Normal rate and regular rhythm. S1 and S2 normal without gallop, murmur, click, rub or other extra sounds. Abdomen:  Bowel sounds positive; abdomen soft and non-tender without masses, organomegaly, or hernias noted. No hepatosplenomegaly, obese Msk:  Back normal, normal gait. Muscle strength and tone normal. Extremities:  mild bilateral lower extremity edema Neurologic:  Alert and oriented x 3. Skin:  Intact without lesions or rashes. Cervical Nodes:  no significant adenopathy Psych:  Normal affect.   EKG  Procedure date:  08/16/2010  Findings:      sinus rhythm, ventricular pacing   ICD Specifications Following MD:  Lewayne Bunting, MD     ICD Vendor:  Medtronic     ICD Model Number:  [id number]     ICD Serial Number:  [id number] ICD DOI:  04/10/2010     ICD Implanting MD:  Lewayne Bunting, MD  Lead 1:    Location: RA     DOI: 04/10/2010     Model #: [id number]     Serial #: [id number]     Status: active Lead 2:    Location: RV     DOI: 04/10/2010     Model #: [id number]     Serial #: [id number]     Status: active Lead 3:    Location: LV     DOI: 04/10/2010     Model #: [id number]     Serial #: [id number]     Status: active  Indications::  CM   Episodes Coumadin:  No  Brady Parameters Mode DDD     Lower Rate Limit:  50     Upper Rate Limit 130 PAV 200     Sensed AV Delay:  180  Tachy Zones VF:  200     VT:  171     Impression & Recommendations:  Problem # 1:  ISCHEMIC CARDIOMYOPATHY  EF 25% (ICD-414.8) Today he he agrees to try a slightly higher dose of carvedilol 9.375  b.i.d. He will otherwise remain on the meds as listed. Orders: EKG w/ Interpretation (93000)  Problem # 2:  CAD SEVERE (ICD-414.00) He has no symptoms. We will continue with risk reduction.  Problem # 3:  AUTOMATIC IMPLANTABLE CARDIAC DEFIBRILLATOR SITU (ICD-V45.02) He still has some intermittent diaphragmatic stimulation but this is not particularly problematic.  Patient Instructions: 1)  Your physician recommends that you schedule a follow-up appointment in: 6 weeks with Dr Antoine Poche 2)  Your physician has recommended you make the following change in your medication: Increase carvedilol to 9.375 mg twice a day (6.25 - one and 1/2 tablet twice a day)

## 2010-08-29 LAB — GLUCOSE, CAPILLARY
Glucose-Capillary: 107 mg/dL — ABNORMAL HIGH (ref 70–99)
Glucose-Capillary: 115 mg/dL — ABNORMAL HIGH (ref 70–99)
Glucose-Capillary: 142 mg/dL — ABNORMAL HIGH (ref 70–99)
Glucose-Capillary: 174 mg/dL — ABNORMAL HIGH (ref 70–99)

## 2010-08-29 LAB — SURGICAL PCR SCREEN: MRSA, PCR: NEGATIVE

## 2010-09-04 NOTE — Assessment & Plan Note (Signed)
Summary: [name] MEDICINE/CLE  Nurse Visit   Allergies: 1)  ! Nsaids 2)  * Vioxx (Rofecoxib) 3)  * Crestor 4)  Celebrex (Celecoxib)  Medication Administration  Injection # 1:    Medication: Testosterone Cypionat 200mg  ing    Diagnosis: TESTOSTERONE DEFICIENCY (ICD-257.2)    Route: IM    Site: RUOQ gluteus    Exp Date: 01/16/2012    Lot #: [id number]    Comments: 250 mg per Dr. Para March    Patient tolerated injection without complications    Given by: Selena Batten Dance CMA Brindy Higginbotham Dull) (August 28, 2010 9:55 AM)  Orders Added: 1)  Admin of patients own med IM/SQ [[id number]]  Appended Document: [name] MEDICINE/CLE Manufacturer: [employer]

## 2010-09-06 ENCOUNTER — Telehealth: Payer: Self-pay | Admitting: Family Medicine

## 2010-09-06 DIAGNOSIS — E119 Type 2 diabetes mellitus without complications: Secondary | ICD-10-CM

## 2010-09-06 DIAGNOSIS — E349 Endocrine disorder, unspecified: Secondary | ICD-10-CM

## 2010-09-06 NOTE — Telephone Encounter (Signed)
See prev note

## 2010-09-06 NOTE — Telephone Encounter (Signed)
I called pt.  I was recently made aware of possible dose error in testosterone administration.  I admitted this to him and apologized.  I have discussed the case with Clarisa Schools about documentation in EMR AV:[id number].  She has reviewed this with staff. We have implemented new EMR which should clarify dosing.  I do not expect the change in dose to have sig long term effect for him.  I discussed all of this with the patient.   He is doing well per his report w/o known adverse effect.  He will be due for repeat labs in mid April and he'll call back to schedule this and an OV with me.  We'll have follow up office visit a few days after the labs visit.  His dose of testosterone can be adjusted as needed and given at the OV with me (not at the lab visit).  He agrees with the plan.  I thanked him for taking the call.

## 2010-09-14 ENCOUNTER — Other Ambulatory Visit: Payer: Self-pay | Admitting: Surgery

## 2010-09-14 DIAGNOSIS — E041 Nontoxic single thyroid nodule: Secondary | ICD-10-CM

## 2010-09-22 LAB — GLUCOSE, CAPILLARY: Glucose-Capillary: 152 mg/dL — ABNORMAL HIGH (ref 70–99)

## 2010-09-22 LAB — BASIC METABOLIC PANEL
Chloride: 104 mEq/L (ref 96–112)
GFR calc Af Amer: 26 mL/min — ABNORMAL LOW (ref >60)
GFR calc non Af Amer: 21 mL/min — ABNORMAL LOW (ref >60)
Potassium: 3.1 mEq/L — ABNORMAL LOW (ref 3.5–5.1)
Sodium: 133 mEq/L — ABNORMAL LOW (ref 135–145)

## 2010-09-23 LAB — BASIC METABOLIC PANEL
BUN: 17 mg/dL (ref 6–23)
BUN: 36 mg/dL — ABNORMAL HIGH (ref 6–23)
BUN: 36 mg/dL — ABNORMAL HIGH (ref 6–23)
CO2: 21 mEq/L (ref 19–32)
CO2: 22 mEq/L (ref 19–32)
CO2: 23 mEq/L (ref 19–32)
CO2: 23 mEq/L (ref 19–32)
CO2: 24 mEq/L (ref 19–32)
CO2: 25 mEq/L (ref 19–32)
Calcium: 8 mg/dL — ABNORMAL LOW (ref 8.4–10.5)
Calcium: 8 mg/dL — ABNORMAL LOW (ref 8.4–10.5)
Calcium: 8.1 mg/dL — ABNORMAL LOW (ref 8.4–10.5)
Calcium: 8.1 mg/dL — ABNORMAL LOW (ref 8.4–10.5)
Calcium: 8.9 mg/dL (ref 8.4–10.5)
Chloride: 101 mEq/L (ref 96–112)
Chloride: 103 mEq/L (ref 96–112)
Chloride: 106 mEq/L (ref 96–112)
Chloride: 106 mEq/L (ref 96–112)
Creatinine, Ser: 1.51 mg/dL — ABNORMAL HIGH (ref 0.4–1.5)
Creatinine, Ser: 1.93 mg/dL — ABNORMAL HIGH (ref 0.4–1.5)
Creatinine, Ser: 2.58 mg/dL — ABNORMAL HIGH (ref 0.4–1.5)
Creatinine, Ser: 2.98 mg/dL — ABNORMAL HIGH (ref 0.4–1.5)
GFR calc Af Amer: 21 mL/min — ABNORMAL LOW (ref >60)
GFR calc Af Amer: 24 mL/min — ABNORMAL LOW (ref >60)
GFR calc Af Amer: 29 mL/min — ABNORMAL LOW (ref >60)
GFR calc Af Amer: 41 mL/min — ABNORMAL LOW (ref >60)
GFR calc Af Amer: 54 mL/min — ABNORMAL LOW (ref >60)
GFR calc non Af Amer: 45 mL/min — ABNORMAL LOW (ref >60)
GFR calc non Af Amer: 48 mL/min — ABNORMAL LOW (ref >60)
Glucose, Bld: 137 mg/dL — ABNORMAL HIGH (ref 70–99)
Glucose, Bld: 242 mg/dL — ABNORMAL HIGH (ref 70–99)
Potassium: 3.7 mEq/L (ref 3.5–5.1)
Potassium: 3.8 mEq/L (ref 3.5–5.1)
Potassium: 4 mEq/L (ref 3.5–5.1)
Potassium: 4.2 mEq/L (ref 3.5–5.1)
Sodium: 131 mEq/L — ABNORMAL LOW (ref 135–145)
Sodium: 134 mEq/L — ABNORMAL LOW (ref 135–145)
Sodium: 137 mEq/L (ref 135–145)
Sodium: 139 mEq/L (ref 135–145)

## 2010-09-23 LAB — HEMOGLOBIN AND HEMATOCRIT, BLOOD
HCT: 29.9 % — ABNORMAL LOW (ref 39.0–52.0)
HCT: 30 % — ABNORMAL LOW (ref 39.0–52.0)
Hemoglobin: 10 g/dL — ABNORMAL LOW (ref 13.0–17.0)
Hemoglobin: 9.9 g/dL — ABNORMAL LOW (ref 13.0–17.0)

## 2010-09-23 LAB — GLUCOSE, CAPILLARY
Glucose-Capillary: 112 mg/dL — ABNORMAL HIGH (ref 70–99)
Glucose-Capillary: 126 mg/dL — ABNORMAL HIGH (ref 70–99)
Glucose-Capillary: 128 mg/dL — ABNORMAL HIGH (ref 70–99)
Glucose-Capillary: 148 mg/dL — ABNORMAL HIGH (ref 70–99)
Glucose-Capillary: 157 mg/dL — ABNORMAL HIGH (ref 70–99)
Glucose-Capillary: 160 mg/dL — ABNORMAL HIGH (ref 70–99)
Glucose-Capillary: 174 mg/dL — ABNORMAL HIGH (ref 70–99)
Glucose-Capillary: 181 mg/dL — ABNORMAL HIGH (ref 70–99)
Glucose-Capillary: 187 mg/dL — ABNORMAL HIGH (ref 70–99)
Glucose-Capillary: 196 mg/dL — ABNORMAL HIGH (ref 70–99)
Glucose-Capillary: 199 mg/dL — ABNORMAL HIGH (ref 70–99)
Glucose-Capillary: 201 mg/dL — ABNORMAL HIGH (ref 70–99)
Glucose-Capillary: 208 mg/dL — ABNORMAL HIGH (ref 70–99)
Glucose-Capillary: 214 mg/dL — ABNORMAL HIGH (ref 70–99)
Glucose-Capillary: 220 mg/dL — ABNORMAL HIGH (ref 70–99)
Glucose-Capillary: 225 mg/dL — ABNORMAL HIGH (ref 70–99)
Glucose-Capillary: 233 mg/dL — ABNORMAL HIGH (ref 70–99)
Glucose-Capillary: 269 mg/dL — ABNORMAL HIGH (ref 70–99)

## 2010-09-23 LAB — TYPE AND SCREEN
ABO/RH(D): O NEG
Antibody Screen: NEGATIVE

## 2010-09-23 LAB — CREATININE, FLUID (PLEURAL, PERITONEAL, JP DRAINAGE): Creat, Fluid: 3.4 mg/dL

## 2010-09-23 LAB — CBC
Hemoglobin: 12.9 g/dL — ABNORMAL LOW (ref 13.0–17.0)
MCHC: 32.8 g/dL (ref 30.0–36.0)
RBC: 4.66 MIL/uL (ref 4.22–5.81)

## 2010-09-23 LAB — CREATININE, URINE, RANDOM: Creatinine, Urine: 93.1 mg/dL

## 2010-09-23 LAB — ABO/RH: ABO/RH(D): O NEG

## 2010-09-24 LAB — BASIC METABOLIC PANEL
BUN: 14 mg/dL (ref 6–23)
BUN: 16 mg/dL (ref 6–23)
CO2: 26 mEq/L (ref 19–32)
Calcium: 8.4 mg/dL (ref 8.4–10.5)
Calcium: 8.6 mg/dL (ref 8.4–10.5)
Chloride: 113 mEq/L — ABNORMAL HIGH (ref 96–112)
Creatinine, Ser: 1.23 mg/dL (ref 0.4–1.5)
Creatinine, Ser: 1.28 mg/dL (ref 0.4–1.5)
Creatinine, Ser: 1.34 mg/dL (ref 0.4–1.5)
GFR calc Af Amer: >60 mL/min (ref >60)
GFR calc Af Amer: >60 mL/min (ref >60)
GFR calc non Af Amer: 49 mL/min — ABNORMAL LOW (ref >60)
GFR calc non Af Amer: 51 mL/min — ABNORMAL LOW (ref >60)
GFR calc non Af Amer: 57 mL/min — ABNORMAL LOW (ref >60)
Glucose, Bld: 164 mg/dL — ABNORMAL HIGH (ref 70–99)
Potassium: 4.1 mEq/L (ref 3.5–5.1)
Sodium: 142 mEq/L (ref 135–145)
Sodium: 143 mEq/L (ref 135–145)
Sodium: 143 mEq/L (ref 135–145)

## 2010-09-24 LAB — GLUCOSE, CAPILLARY
Glucose-Capillary: 116 mg/dL — ABNORMAL HIGH (ref 70–99)
Glucose-Capillary: 129 mg/dL — ABNORMAL HIGH (ref 70–99)
Glucose-Capillary: 139 mg/dL — ABNORMAL HIGH (ref 70–99)
Glucose-Capillary: 148 mg/dL — ABNORMAL HIGH (ref 70–99)
Glucose-Capillary: 171 mg/dL — ABNORMAL HIGH (ref 70–99)
Glucose-Capillary: 173 mg/dL — ABNORMAL HIGH (ref 70–99)
Glucose-Capillary: 187 mg/dL — ABNORMAL HIGH (ref 70–99)
Glucose-Capillary: 190 mg/dL — ABNORMAL HIGH (ref 70–99)
Glucose-Capillary: 195 mg/dL — ABNORMAL HIGH (ref 70–99)
Glucose-Capillary: 242 mg/dL — ABNORMAL HIGH (ref 70–99)
Glucose-Capillary: 266 mg/dL — ABNORMAL HIGH (ref 70–99)
Glucose-Capillary: 269 mg/dL — ABNORMAL HIGH (ref 70–99)
Glucose-Capillary: 275 mg/dL — ABNORMAL HIGH (ref 70–99)
Glucose-Capillary: 321 mg/dL — ABNORMAL HIGH (ref 70–99)
Glucose-Capillary: 357 mg/dL — ABNORMAL HIGH (ref 70–99)

## 2010-09-24 LAB — CBC
HCT: 34.5 % — ABNORMAL LOW (ref 39.0–52.0)
HCT: 42.8 % (ref 39.0–52.0)
Hemoglobin: 11.6 g/dL — ABNORMAL LOW (ref 13.0–17.0)
Hemoglobin: 14.7 g/dL (ref 13.0–17.0)
MCHC: 33.6 g/dL (ref 30.0–36.0)
MCHC: 34.3 g/dL (ref 30.0–36.0)
MCV: 84 fL (ref 78.0–100.0)
MCV: 84.3 fL (ref 78.0–100.0)
MCV: 84.3 fL (ref 78.0–100.0)
Platelets: 165 10*3/uL (ref 150–400)
Platelets: 183 10*3/uL (ref 150–400)
RBC: 4.11 MIL/uL — ABNORMAL LOW (ref 4.22–5.81)
RBC: 5.07 MIL/uL (ref 4.22–5.81)
RDW: 15.7 % — ABNORMAL HIGH (ref 11.5–15.5)
WBC: 7.1 10*3/uL (ref 4.0–10.5)

## 2010-09-24 LAB — URINALYSIS, ROUTINE W REFLEX MICROSCOPIC
Bilirubin Urine: NEGATIVE
Leukocytes, UA: NEGATIVE
Nitrite: NEGATIVE
Specific Gravity, Urine: 1.03 (ref 1.005–1.030)
Urobilinogen, UA: 0.2 mg/dL (ref 0.0–1.0)

## 2010-09-24 LAB — URINE MICROSCOPIC-ADD ON

## 2010-09-24 LAB — COMPREHENSIVE METABOLIC PANEL
ALT: 16 U/L (ref 0–53)
BUN: 15 mg/dL (ref 6–23)
CO2: 26 mEq/L (ref 19–32)
Calcium: 8.9 mg/dL (ref 8.4–10.5)
Creatinine, Ser: 1.45 mg/dL (ref 0.4–1.5)
GFR calc non Af Amer: 47 mL/min — ABNORMAL LOW (ref >60)
Glucose, Bld: 284 mg/dL — ABNORMAL HIGH (ref 70–99)
Sodium: 138 mEq/L (ref 135–145)

## 2010-09-24 LAB — DIFFERENTIAL
Eosinophils Absolute: 0.1 10*3/uL (ref 0.0–0.7)
Lymphs Abs: 0.8 10*3/uL (ref 0.7–4.0)
Neutro Abs: 8.7 10*3/uL — ABNORMAL HIGH (ref 1.7–7.7)
Neutrophils Relative %: 86 % — ABNORMAL HIGH (ref 43–77)

## 2010-09-24 LAB — SAMPLE TO BLOOD BANK

## 2010-09-25 ENCOUNTER — Encounter: Payer: Self-pay | Admitting: Family Medicine

## 2010-09-25 DIAGNOSIS — E041 Nontoxic single thyroid nodule: Secondary | ICD-10-CM | POA: Insufficient documentation

## 2010-09-25 LAB — BASIC METABOLIC PANEL
BUN: 12 mg/dL (ref 6–23)
BUN: 15 mg/dL (ref 6–23)
CO2: 24 mEq/L (ref 19–32)
Calcium: 8.2 mg/dL — ABNORMAL LOW (ref 8.4–10.5)
Chloride: 102 mEq/L (ref 96–112)
Chloride: 106 mEq/L (ref 96–112)
Creatinine, Ser: 1.27 mg/dL (ref 0.4–1.5)
GFR calc Af Amer: 59 mL/min — ABNORMAL LOW (ref >60)
GFR calc Af Amer: >60 mL/min (ref >60)
Potassium: 4.5 mEq/L (ref 3.5–5.1)
Sodium: 137 mEq/L (ref 135–145)

## 2010-09-25 LAB — GLUCOSE, CAPILLARY
Glucose-Capillary: 178 mg/dL — ABNORMAL HIGH (ref 70–99)
Glucose-Capillary: 183 mg/dL — ABNORMAL HIGH (ref 70–99)
Glucose-Capillary: 207 mg/dL — ABNORMAL HIGH (ref 70–99)
Glucose-Capillary: 258 mg/dL — ABNORMAL HIGH (ref 70–99)
Glucose-Capillary: 262 mg/dL — ABNORMAL HIGH (ref 70–99)
Glucose-Capillary: 129 mg/dL — ABNORMAL HIGH (ref 70–99)
Glucose-Capillary: 142 mg/dL — ABNORMAL HIGH (ref 70–99)
Glucose-Capillary: 155 mg/dL — ABNORMAL HIGH (ref 70–99)
Glucose-Capillary: 157 mg/dL — ABNORMAL HIGH (ref 70–99)
Glucose-Capillary: 159 mg/dL — ABNORMAL HIGH (ref 70–99)
Glucose-Capillary: 160 mg/dL — ABNORMAL HIGH (ref 70–99)
Glucose-Capillary: 168 mg/dL — ABNORMAL HIGH (ref 70–99)
Glucose-Capillary: 171 mg/dL — ABNORMAL HIGH (ref 70–99)
Glucose-Capillary: 174 mg/dL — ABNORMAL HIGH (ref 70–99)
Glucose-Capillary: 178 mg/dL — ABNORMAL HIGH (ref 70–99)
Glucose-Capillary: 179 mg/dL — ABNORMAL HIGH (ref 70–99)
Glucose-Capillary: 185 mg/dL — ABNORMAL HIGH (ref 70–99)
Glucose-Capillary: 186 mg/dL — ABNORMAL HIGH (ref 70–99)
Glucose-Capillary: 188 mg/dL — ABNORMAL HIGH (ref 70–99)
Glucose-Capillary: 194 mg/dL — ABNORMAL HIGH (ref 70–99)
Glucose-Capillary: 196 mg/dL — ABNORMAL HIGH (ref 70–99)
Glucose-Capillary: 232 mg/dL — ABNORMAL HIGH (ref 70–99)
Glucose-Capillary: 239 mg/dL — ABNORMAL HIGH (ref 70–99)
Glucose-Capillary: 275 mg/dL — ABNORMAL HIGH (ref 70–99)

## 2010-09-25 LAB — CBC
HCT: 35.8 % — ABNORMAL LOW (ref 39.0–52.0)
HCT: 37.4 % — ABNORMAL LOW (ref 39.0–52.0)
HCT: 40.9 % (ref 39.0–52.0)
Hemoglobin: 12.7 g/dL — ABNORMAL LOW (ref 13.0–17.0)
Hemoglobin: 13.6 g/dL (ref 13.0–17.0)
MCHC: 33.3 g/dL (ref 30.0–36.0)
MCHC: 34.1 g/dL (ref 30.0–36.0)
MCV: 84.6 fL (ref 78.0–100.0)
MCV: 84.8 fL (ref 78.0–100.0)
MCV: 84.9 fL (ref 78.0–100.0)
Platelets: 225 10*3/uL (ref 150–400)
Platelets: 248 10*3/uL (ref 150–400)
Platelets: 305 10*3/uL (ref 150–400)
RBC: 3.8 MIL/uL — ABNORMAL LOW (ref 4.22–5.81)
RBC: 4.41 MIL/uL (ref 4.22–5.81)
RDW: 16 % — ABNORMAL HIGH (ref 11.5–15.5)
RDW: 16.3 % — ABNORMAL HIGH (ref 11.5–15.5)
WBC: 7.2 10*3/uL (ref 4.0–10.5)
WBC: 8.4 10*3/uL (ref 4.0–10.5)

## 2010-09-25 LAB — URINALYSIS, ROUTINE W REFLEX MICROSCOPIC
Hgb urine dipstick: NEGATIVE
Nitrite: NEGATIVE
Protein, ur: 100 mg/dL — AB
Specific Gravity, Urine: 1.024 (ref 1.005–1.030)
Urobilinogen, UA: 1 mg/dL (ref 0.0–1.0)

## 2010-09-25 LAB — COMPREHENSIVE METABOLIC PANEL
ALT: 14 U/L (ref 0–53)
ALT: 17 U/L (ref 0–53)
AST: 24 U/L (ref 0–37)
Albumin: 3.4 g/dL — ABNORMAL LOW (ref 3.5–5.2)
Albumin: 3.9 g/dL (ref 3.5–5.2)
Alkaline Phosphatase: 88 U/L (ref 39–117)
Calcium: 9.2 mg/dL (ref 8.4–10.5)
Chloride: 104 mEq/L (ref 96–112)
GFR calc Af Amer: 57 mL/min — ABNORMAL LOW (ref >60)
GFR calc Af Amer: >60 mL/min (ref >60)
Glucose, Bld: 265 mg/dL — ABNORMAL HIGH (ref 70–99)
Potassium: 3.7 mEq/L (ref 3.5–5.1)
Sodium: 139 mEq/L (ref 135–145)
Total Bilirubin: 0.8 mg/dL (ref 0.3–1.2)
Total Protein: 7.3 g/dL (ref 6.0–8.3)

## 2010-09-25 LAB — DIFFERENTIAL
Basophils Absolute: 0 10*3/uL (ref 0.0–0.1)
Basophils Relative: 0 % (ref 0–1)
Eosinophils Absolute: 0.1 10*3/uL (ref 0.0–0.7)
Eosinophils Relative: 1 % (ref 0–5)
Lymphs Abs: 0.7 10*3/uL (ref 0.7–4.0)
Monocytes Absolute: 0.6 10*3/uL (ref 0.1–1.0)
Monocytes Relative: 5 % (ref 3–12)
Neutrophils Relative %: 85 % — ABNORMAL HIGH (ref 43–77)

## 2010-09-25 LAB — TYPE AND SCREEN
ABO/RH(D): O NEG
Antibody Screen: NEGATIVE

## 2010-09-25 LAB — URINALYSIS, MICROSCOPIC ONLY
Glucose, UA: >1000 mg/dL — AB
Leukocytes, UA: NEGATIVE
Nitrite: NEGATIVE
Specific Gravity, Urine: 1.024 (ref 1.005–1.030)
pH: 5.5 (ref 5.0–8.0)

## 2010-09-25 LAB — URINE MICROSCOPIC-ADD ON

## 2010-09-26 ENCOUNTER — Encounter: Payer: Self-pay | Admitting: Cardiology

## 2010-09-26 LAB — GLUCOSE, CAPILLARY
Glucose-Capillary: 176 mg/dL — ABNORMAL HIGH (ref 70–99)
Glucose-Capillary: 190 mg/dL — ABNORMAL HIGH (ref 70–99)
Glucose-Capillary: 195 mg/dL — ABNORMAL HIGH (ref 70–99)
Glucose-Capillary: 232 mg/dL — ABNORMAL HIGH (ref 70–99)
Glucose-Capillary: 237 mg/dL — ABNORMAL HIGH (ref 70–99)

## 2010-09-27 ENCOUNTER — Encounter: Payer: Self-pay | Admitting: Family Medicine

## 2010-09-27 LAB — BASIC METABOLIC PANEL
BUN: 16 mg/dL (ref 6–23)
BUN: 19 mg/dL (ref 6–23)
BUN: 24 mg/dL — ABNORMAL HIGH (ref 6–23)
BUN: 24 mg/dL — ABNORMAL HIGH (ref 6–23)
BUN: 26 mg/dL — ABNORMAL HIGH (ref 6–23)
BUN: 12 mg/dL (ref 6–23)
CO2: 25 mEq/L (ref 19–32)
Calcium: 7.9 mg/dL — ABNORMAL LOW (ref 8.4–10.5)
Calcium: 8.3 mg/dL — ABNORMAL LOW (ref 8.4–10.5)
Calcium: 8.4 mg/dL (ref 8.4–10.5)
Calcium: 8.7 mg/dL (ref 8.4–10.5)
Chloride: 108 mEq/L (ref 96–112)
Chloride: 107 mEq/L (ref 96–112)
Creatinine, Ser: 1.71 mg/dL — ABNORMAL HIGH (ref 0.4–1.5)
Creatinine, Ser: 1.88 mg/dL — ABNORMAL HIGH (ref 0.4–1.5)
Creatinine, Ser: 2.01 mg/dL — ABNORMAL HIGH (ref 0.4–1.5)
Creatinine, Ser: 1.43 mg/dL (ref 0.4–1.5)
GFR calc Af Amer: 57 mL/min — ABNORMAL LOW (ref >60)
GFR calc Af Amer: 58 mL/min — ABNORMAL LOW (ref >60)
GFR calc non Af Amer: 31 mL/min — ABNORMAL LOW (ref >60)
GFR calc non Af Amer: 32 mL/min — ABNORMAL LOW (ref >60)
GFR calc non Af Amer: 32 mL/min — ABNORMAL LOW (ref >60)
GFR calc non Af Amer: 35 mL/min — ABNORMAL LOW (ref >60)
GFR calc non Af Amer: 48 mL/min — ABNORMAL LOW (ref >60)
Glucose, Bld: 80 mg/dL (ref 70–99)
Glucose, Bld: 83 mg/dL (ref 70–99)
Glucose, Bld: 96 mg/dL (ref 70–99)
Potassium: 3.8 mEq/L (ref 3.5–5.1)
Potassium: 4 mEq/L (ref 3.5–5.1)
Potassium: 4.3 mEq/L (ref 3.5–5.1)
Potassium: 3.8 mEq/L (ref 3.5–5.1)
Sodium: 139 mEq/L (ref 135–145)

## 2010-09-27 LAB — CBC
HCT: 28.3 % — ABNORMAL LOW (ref 39.0–52.0)
HCT: 28.9 % — ABNORMAL LOW (ref 39.0–52.0)
HCT: 41.6 % (ref 39.0–52.0)
HCT: 38.2 % — ABNORMAL LOW (ref 39.0–52.0)
Hemoglobin: 10.4 g/dL — ABNORMAL LOW (ref 13.0–17.0)
Hemoglobin: 14.1 g/dL (ref 13.0–17.0)
Hemoglobin: 9.7 g/dL — ABNORMAL LOW (ref 13.0–17.0)
MCHC: 33.9 g/dL (ref 30.0–36.0)
MCHC: 34.2 g/dL (ref 30.0–36.0)
MCHC: 34.5 g/dL (ref 30.0–36.0)
MCHC: 34.3 g/dL (ref 30.0–36.0)
MCV: 86 fL (ref 78.0–100.0)
MCV: 86.5 fL (ref 78.0–100.0)
MCV: 86.7 fL (ref 78.0–100.0)
MCV: 87.1 fL (ref 78.0–100.0)
MCV: 87.3 fL (ref 78.0–100.0)
MCV: 86.6 fL (ref 78.0–100.0)
MCV: 86.7 fL (ref 78.0–100.0)
Platelets: 110 10*3/uL — ABNORMAL LOW (ref 150–400)
Platelets: 150 10*3/uL (ref 150–400)
Platelets: 167 10*3/uL (ref 150–400)
Platelets: 92 10*3/uL — ABNORMAL LOW (ref 150–400)
Platelets: 183 10*3/uL (ref 150–400)
RBC: 3.31 MIL/uL — ABNORMAL LOW (ref 4.22–5.81)
RBC: 3.53 MIL/uL — ABNORMAL LOW (ref 4.22–5.81)
RBC: 4.78 MIL/uL (ref 4.22–5.81)
RBC: 4.41 MIL/uL (ref 4.22–5.81)
RDW: 15 % (ref 11.5–15.5)
RDW: 15.4 % (ref 11.5–15.5)
RDW: 15.5 % (ref 11.5–15.5)
RDW: 15.9 % — ABNORMAL HIGH (ref 11.5–15.5)
WBC: 10 10*3/uL (ref 4.0–10.5)
WBC: 6.8 10*3/uL (ref 4.0–10.5)
WBC: 8.7 10*3/uL (ref 4.0–10.5)
WBC: 9.5 10*3/uL (ref 4.0–10.5)
WBC: 9.9 10*3/uL (ref 4.0–10.5)
WBC: 5 10*3/uL (ref 4.0–10.5)
WBC: 6.1 10*3/uL (ref 4.0–10.5)

## 2010-09-27 LAB — BLOOD GAS, ARTERIAL
Acid-base deficit: 4.3 mmol/L — ABNORMAL HIGH (ref 0.0–2.0)
Bicarbonate: 19.6 mEq/L — ABNORMAL LOW (ref 20.0–24.0)
O2 Saturation: 96.2 %
Patient temperature: 98.6
TCO2: 20.6 mmol/L (ref 0–100)
pO2, Arterial: 79 mmHg — ABNORMAL LOW (ref 80.0–100.0)

## 2010-09-27 LAB — GLUCOSE, CAPILLARY
Glucose-Capillary: 100 mg/dL — ABNORMAL HIGH (ref 70–99)
Glucose-Capillary: 101 mg/dL — ABNORMAL HIGH (ref 70–99)
Glucose-Capillary: 107 mg/dL — ABNORMAL HIGH (ref 70–99)
Glucose-Capillary: 127 mg/dL — ABNORMAL HIGH (ref 70–99)
Glucose-Capillary: 134 mg/dL — ABNORMAL HIGH (ref 70–99)
Glucose-Capillary: 134 mg/dL — ABNORMAL HIGH (ref 70–99)
Glucose-Capillary: 136 mg/dL — ABNORMAL HIGH (ref 70–99)
Glucose-Capillary: 143 mg/dL — ABNORMAL HIGH (ref 70–99)
Glucose-Capillary: 154 mg/dL — ABNORMAL HIGH (ref 70–99)
Glucose-Capillary: 157 mg/dL — ABNORMAL HIGH (ref 70–99)
Glucose-Capillary: 157 mg/dL — ABNORMAL HIGH (ref 70–99)
Glucose-Capillary: 166 mg/dL — ABNORMAL HIGH (ref 70–99)
Glucose-Capillary: 185 mg/dL — ABNORMAL HIGH (ref 70–99)
Glucose-Capillary: 211 mg/dL — ABNORMAL HIGH (ref 70–99)
Glucose-Capillary: 81 mg/dL (ref 70–99)
Glucose-Capillary: 88 mg/dL (ref 70–99)
Glucose-Capillary: 90 mg/dL (ref 70–99)
Glucose-Capillary: 146 mg/dL — ABNORMAL HIGH (ref 70–99)
Glucose-Capillary: 156 mg/dL — ABNORMAL HIGH (ref 70–99)
Glucose-Capillary: 183 mg/dL — ABNORMAL HIGH (ref 70–99)
Glucose-Capillary: 203 mg/dL — ABNORMAL HIGH (ref 70–99)
Glucose-Capillary: 209 mg/dL — ABNORMAL HIGH (ref 70–99)
Glucose-Capillary: 235 mg/dL — ABNORMAL HIGH (ref 70–99)
Glucose-Capillary: 266 mg/dL — ABNORMAL HIGH (ref 70–99)

## 2010-09-27 LAB — POCT I-STAT 4, (NA,K, GLUC, HGB,HCT)
Glucose, Bld: 128 mg/dL — ABNORMAL HIGH (ref 70–99)
Glucose, Bld: 90 mg/dL (ref 70–99)
HCT: 36 % — ABNORMAL LOW (ref 39.0–52.0)
Hemoglobin: 11.2 g/dL — ABNORMAL LOW (ref 13.0–17.0)
Hemoglobin: 12.2 g/dL — ABNORMAL LOW (ref 13.0–17.0)
Hemoglobin: 9.5 g/dL — ABNORMAL LOW (ref 13.0–17.0)
Hemoglobin: 9.5 g/dL — ABNORMAL LOW (ref 13.0–17.0)
Potassium: 3.2 mEq/L — ABNORMAL LOW (ref 3.5–5.1)
Potassium: 3.5 mEq/L (ref 3.5–5.1)
Potassium: 3.8 mEq/L (ref 3.5–5.1)
Sodium: 140 mEq/L (ref 135–145)
Sodium: 141 mEq/L (ref 135–145)
Sodium: 141 mEq/L (ref 135–145)

## 2010-09-27 LAB — COMPREHENSIVE METABOLIC PANEL
ALT: 18 U/L (ref 0–53)
BUN: 20 mg/dL (ref 6–23)
CO2: 19 mEq/L (ref 19–32)
Calcium: 8.6 mg/dL (ref 8.4–10.5)
GFR calc non Af Amer: 42 mL/min — ABNORMAL LOW (ref >60)
Glucose, Bld: 101 mg/dL — ABNORMAL HIGH (ref 70–99)
Sodium: 139 mEq/L (ref 135–145)

## 2010-09-27 LAB — URINE MICROSCOPIC-ADD ON

## 2010-09-27 LAB — CREATININE, SERUM: GFR calc non Af Amer: 52 mL/min — ABNORMAL LOW (ref >60)

## 2010-09-27 LAB — POCT I-STAT 3, ART BLOOD GAS (G3+)
Acid-base deficit: 6 mmol/L — ABNORMAL HIGH (ref 0.0–2.0)
Bicarbonate: 19.7 mEq/L — ABNORMAL LOW (ref 20.0–24.0)
O2 Saturation: 100 %
TCO2: 24 mmol/L (ref 0–100)
TCO2: 25 mmol/L (ref 0–100)
pCO2 arterial: 35.3 mmHg (ref 35.0–45.0)
pCO2 arterial: 41.7 mmHg (ref 35.0–45.0)
pCO2 arterial: 42.2 mmHg (ref 35.0–45.0)
pH, Arterial: 7.327 — ABNORMAL LOW (ref 7.350–7.450)
pH, Arterial: 7.333 — ABNORMAL LOW (ref 7.350–7.450)
pO2, Arterial: 343 mmHg — ABNORMAL HIGH (ref 80.0–100.0)
pO2, Arterial: 57 mmHg — ABNORMAL LOW (ref 80.0–100.0)
pO2, Arterial: 63 mmHg — ABNORMAL LOW (ref 80.0–100.0)
pO2, Arterial: 64 mmHg — ABNORMAL LOW (ref 80.0–100.0)

## 2010-09-27 LAB — MAGNESIUM
Magnesium: 1.8 mg/dL (ref 1.5–2.5)
Magnesium: 2.1 mg/dL (ref 1.5–2.5)

## 2010-09-27 LAB — PLATELET COUNT: Platelets: 152 10*3/uL (ref 150–400)

## 2010-09-27 LAB — URINALYSIS, ROUTINE W REFLEX MICROSCOPIC
Bilirubin Urine: NEGATIVE
Hgb urine dipstick: NEGATIVE
Specific Gravity, Urine: 1.024 (ref 1.005–1.030)
Urobilinogen, UA: 0.2 mg/dL (ref 0.0–1.0)
pH: 5.5 (ref 5.0–8.0)

## 2010-09-27 LAB — PROTIME-INR
INR: 1 (ref 0.00–1.49)
Prothrombin Time: 17.8 seconds — ABNORMAL HIGH (ref 11.6–15.2)

## 2010-09-27 LAB — POCT I-STAT, CHEM 8
Calcium, Ion: 1.13 mmol/L (ref 1.12–1.32)
Chloride: 107 mEq/L (ref 96–112)
HCT: 28 % — ABNORMAL LOW (ref 39.0–52.0)
Potassium: 4 mEq/L (ref 3.5–5.1)
Sodium: 140 mEq/L (ref 135–145)

## 2010-09-27 LAB — HEMOGLOBIN AND HEMATOCRIT, BLOOD: Hemoglobin: 9.3 g/dL — ABNORMAL LOW (ref 13.0–17.0)

## 2010-09-27 LAB — HEMOGLOBIN A1C
Hgb A1c MFr Bld: 8.6 % — ABNORMAL HIGH (ref 4.6–6.1)
Mean Plasma Glucose: 200 mg/dL

## 2010-09-27 LAB — TYPE AND SCREEN: ABO/RH(D): O NEG

## 2010-10-01 ENCOUNTER — Ambulatory Visit
Admission: RE | Admit: 2010-10-01 | Discharge: 2010-10-01 | Disposition: A | Discharge status: Home / Self Care | Payer: Medicare Other | Source: Ambulatory Visit | Attending: Surgery | Admitting: Surgery

## 2010-10-01 ENCOUNTER — Ambulatory Visit (INDEPENDENT_AMBULATORY_CARE_PROVIDER_SITE_OTHER): Payer: Medicare Other | Admitting: Cardiology

## 2010-10-01 ENCOUNTER — Encounter: Payer: Self-pay | Admitting: Cardiology

## 2010-10-01 VITALS — BP 134/70 | HR 64 | Resp 18 | Ht 73.0 in | Wt 224.0 lb

## 2010-10-01 DIAGNOSIS — I5022 Chronic systolic (congestive) heart failure: Secondary | ICD-10-CM

## 2010-10-01 DIAGNOSIS — E669 Obesity, unspecified: Secondary | ICD-10-CM

## 2010-10-01 DIAGNOSIS — E041 Nontoxic single thyroid nodule: Secondary | ICD-10-CM

## 2010-10-01 DIAGNOSIS — E785 Hyperlipidemia, unspecified: Secondary | ICD-10-CM

## 2010-10-01 LAB — DIFFERENTIAL
Basophils Absolute: 0 10*3/uL (ref 0.0–0.1)
Lymphocytes Relative: 14 % (ref 12–46)
Neutro Abs: 5.9 10*3/uL (ref 1.7–7.7)

## 2010-10-01 LAB — URINALYSIS, ROUTINE W REFLEX MICROSCOPIC
Bilirubin Urine: NEGATIVE
Glucose, UA: 500 mg/dL — AB
Hgb urine dipstick: NEGATIVE
Protein, ur: NEGATIVE mg/dL

## 2010-10-01 LAB — COMPREHENSIVE METABOLIC PANEL
BUN: 22 mg/dL (ref 6–23)
CO2: 24 mEq/L (ref 19–32)
Chloride: 106 mEq/L (ref 96–112)
Creatinine, Ser: 1.7 mg/dL — ABNORMAL HIGH (ref 0.4–1.5)
GFR calc non Af Amer: 39 mL/min — ABNORMAL LOW (ref >60)
Total Bilirubin: 0.7 mg/dL (ref 0.3–1.2)

## 2010-10-01 LAB — LIPASE, BLOOD
Lipase: 44 U/L (ref 11–59)
Lipase: 76 U/L — ABNORMAL HIGH (ref 11–59)

## 2010-10-01 LAB — GLUCOSE, CAPILLARY
Glucose-Capillary: 153 mg/dL — ABNORMAL HIGH (ref 70–99)
Glucose-Capillary: 163 mg/dL — ABNORMAL HIGH (ref 70–99)
Glucose-Capillary: 174 mg/dL — ABNORMAL HIGH (ref 70–99)
Glucose-Capillary: 176 mg/dL — ABNORMAL HIGH (ref 70–99)
Glucose-Capillary: 191 mg/dL — ABNORMAL HIGH (ref 70–99)
Glucose-Capillary: 198 mg/dL — ABNORMAL HIGH (ref 70–99)
Glucose-Capillary: 228 mg/dL — ABNORMAL HIGH (ref 70–99)

## 2010-10-01 LAB — CBC
HCT: 44.2 % (ref 39.0–52.0)
Hemoglobin: 12.8 g/dL — ABNORMAL LOW (ref 13.0–17.0)
MCHC: 34.1 g/dL (ref 30.0–36.0)
MCV: 86.4 fL (ref 78.0–100.0)
MCV: 87.5 fL (ref 78.0–100.0)
Platelets: 233 10*3/uL (ref 150–400)
RDW: 15 % (ref 11.5–15.5)
WBC: 8.1 10*3/uL (ref 4.0–10.5)

## 2010-10-01 LAB — POCT I-STAT, CHEM 8
BUN: 26 mg/dL — ABNORMAL HIGH (ref 6–23)
Calcium, Ion: 1.1 mmol/L — ABNORMAL LOW (ref 1.12–1.32)
Chloride: 106 mEq/L (ref 96–112)
Glucose, Bld: 226 mg/dL — ABNORMAL HIGH (ref 70–99)

## 2010-10-01 LAB — CK TOTAL AND CKMB (NOT AT ARMC)
CK, MB: 3.7 ng/mL (ref 0.3–4.0)
Relative Index: 3 — ABNORMAL HIGH (ref 0.0–2.5)

## 2010-10-01 LAB — CARDIAC PANEL(CRET KIN+CKTOT+MB+TROPI)
CK, MB: 2.7 ng/mL (ref 0.3–4.0)
Total CK: 89 U/L (ref 7–232)

## 2010-10-01 LAB — HEMOGLOBIN A1C: Mean Plasma Glucose: 189 mg/dL

## 2010-10-01 LAB — BRAIN NATRIURETIC PEPTIDE: Pro B Natriuretic peptide (BNP): 126 pg/mL — ABNORMAL HIGH (ref 0.0–100.0)

## 2010-10-01 LAB — HEPATIC FUNCTION PANEL
Albumin: 3.3 g/dL — ABNORMAL LOW (ref 3.5–5.2)
Indirect Bilirubin: 0.8 mg/dL (ref 0.3–0.9)
Total Bilirubin: 1.2 mg/dL (ref 0.3–1.2)

## 2010-10-01 LAB — LIPID PANEL
Cholesterol: 181 mg/dL (ref 0–200)
Total CHOL/HDL Ratio: 4.9 RATIO

## 2010-10-01 LAB — POCT CARDIAC MARKERS: Troponin i, poc: <0.05 ng/mL (ref 0.00–0.09)

## 2010-10-01 NOTE — Progress Notes (Signed)
HPI [name] returns for followup of his cardiomyopathy. I am very slowly titrating his medications. He's been leery of increasing quickly because of previous syncope. He tolerated carvedilol 9.375 mg b.i.d. The systolic is actually been slightly elevated. He has had no presyncope or syncope. He has had no shortness of breath, PND or orthopnea. He has had some positional diaphragmatic stimulation from his biventricular pacemaker.   Allergies  Allergen Reactions  . Celecoxib     REACTION: myalgia: abdomenal pain  . Nsaids     REACTION: renal disease  . Rosuvastatin     REACTION: myalgia: musle pain    Current Outpatient Prescriptions  Medication Sig Dispense Refill  . acetaminophen (TYLENOL) 325 MG tablet Take 650 mg by mouth every 6 (six) hours as needed.        [name] aspirin 81 MG tablet Take 81 mg by mouth daily.        . carvedilol (COREG) 6.25 MG tablet Take 6.25 mg by mouth. Take 1 and 1/2 po bid      . Cholecalciferol (VITAMIN D) 1000 UNITS capsule Take 2 tablets by mouth daily.      . [facility] Alcohol Swabs PADS As needed.       [name] GLUCOSAMINE CHONDROITIN COMPLX PO Take 1 capsule by mouth 2 (two) times daily.       [name] glucose blood (BAYER CONTOUR TEST) test strip Check blood sugar daily       . insulin glargine (LANTUS OPTICLIK) 100 UNIT/ML injection Inject 20 Units into the skin daily.        . Insulin Pen Needle (B-D UF III MINI PEN NEEDLES) 31G X 5 MM MISC Use as directed.       [name] lisinopril (PRINIVIL,ZESTRIL) 2.5 MG tablet Take 2.5 mg by mouth daily.        . naproxen sodium (ANAPROX) 220 MG tablet Take 220 mg by mouth as needed.        . nitroGLYCERIN (NITROSTAT) 0.4 MG SL tablet Place 0.4 mg under the tongue every 5 (five) minutes as needed.        . Nutritional Supplements (GLUCERNA SHAKE PO) Take 4 oz by mouth daily after breakfast.        . pravastatin (PRAVACHOL) 40 MG tablet Take 40 mg by mouth daily.        [name] testosterone enanthate (DELATESTRYL) 200 MG/ML injection Inject  250 mg into the muscle every 28 (twenty-eight) days.         Past Medical History  Diagnosis Date  . HTN (hypertension) (06/17/1981)  . Diverticula of colon (06/18/1983)  . Renal insufficiency   . Hyperlipidemia 06/17/1993)  . Diabetes mellitus  (06/17/1997)  . PUD (peptic ulcer disease) 1960's  . Pancreatitis 01/24 - 07/13/08    ABD CT  fatty liver early pancreatitis  07/12/2008  Pelvic CT Nml 07/12/2008  . Cardiomyopathy     EF 25% Ant Akinesis Global Hypo 3/2-08/18/08  . Thyroid nodule     per Dr. Luisa Hart    Past Surgical History  Procedure Date  . Colonoscopy   . Cardiac catheterization      Severe 3-vessel coronary artery disease.  Ischemic   cardiomyopathy.   . Nephrectomy 01/09/09    Clear Cell Renal Cell Carcinoma 01/09/2009  . Cardiac defibrillator placement     ICD-Medtronic .  [id number] H  . Laparotomy      small mid 80's (laparotomy), 09/12/2000 (no surgery), 10/20/2008 (no surgery)  . Lesion removal  RLE 11/14/2006 (Dr Lindie Spruce)   . Ct of abdomen 06/1997    Fatty process unchanged - myelolipoma adrenal unchangd  . Ct of abdomen 02/99    Adrenal mass unchanged, no further follow up required  . Sbo 08/2000 no surgery   10/20/08 no surgery    Mid 80's laparotomy  . Esophagogastroduodenoscopy 07/12/2008    Schatzki Ring 2 cm. HH (Dr. Arlyce Dice)  . Abdominal US 07/12/08    Fatty infiltrate liver complex cyst in lower pole left kidney  . Left renal ca 2010    s/p partial nephrectomy per Dr. Laverle Patter  . Icd/ crt (lv lead disabled)     Medtronic [id number] H  . Coronary artery bypass graft 09/05/08     Mediastinotomy, extracorporeal circulation,   coronary artery bypass graft surgery x4 using a left internal mammary  artery graft to the left anterior descending coronary artery, with a  saphenous vein graft to the first diagonal branch of the LAD, a   saphenous vein graft to the obtuse marginal branch of the left   circumflex coronary artery,  Dr. Laneta Simmers  . Cholecystectomy 1970's     ROS As stated in the HPI and negative for all other systems.  PHYSICAL EXAM BP 134/70  Pulse 64  Resp 18  Ht 6\' 1"  (1.854 m)  Wt 224 lb (101.606 kg)  BMI 29.55 kg/m2 GENERAL:  Well appearing NECK:  No jugular venous distention, waveform within normal limits, carotid upstroke brisk and symmetric, no bruits, no thyromegaly LYMPHATICS:  No cervical, inguinal adenopathy LUNGS:  Clear to auscultation bilaterally BACK:  No CVA tenderness CHEST:  ICD scar HEART:  PMI not displaced or sustained,S1 and S2 within normal limits, no S3, no S4, no clicks, no rubs, no murmurs ABD:  Flat, positive bowel sounds normal in frequency in pitch, no bruits, no rebound, no guarding, no midline pulsatile mass, no hepatomegaly, no splenomegaly EXT:  2 plus pulses throughout, no edema, no cyanosis no clubbing SKIN:  No rashes no nodules NEURO:  Cranial nerves II through XII grossly intact, motor grossly intact throughout PSYCH:  Cognitively intact, oriented to person place and time   ASSESSMENT AND PLAN

## 2010-10-01 NOTE — Patient Instructions (Signed)
Your physician has recommended you make the following change in your medication: INCREASE your evening dose of Carvedilol to 12.5mg    Your physician recommends that you schedule a follow-up appointment in: 6 WEEKS

## 2010-10-01 NOTE — Assessment & Plan Note (Signed)
He is scheduled to have labs drawn by Dr. Para March at the next appt.

## 2010-10-01 NOTE — Assessment & Plan Note (Signed)
Today I will increase the Coreg.  He wants to move slowly and so I will 3.125 with his p.m. dose for a few weeks and then he will go to 12.5 b.i.d. He will continue with other medications.

## 2010-10-01 NOTE — Assessment & Plan Note (Signed)
The patient understands the need to lose weight with diet and exercise. We have discussed specific strategies for this.  

## 2010-10-02 ENCOUNTER — Other Ambulatory Visit (INDEPENDENT_AMBULATORY_CARE_PROVIDER_SITE_OTHER): Payer: Medicare Other | Admitting: Family Medicine

## 2010-10-02 DIAGNOSIS — E291 Testicular hypofunction: Secondary | ICD-10-CM

## 2010-10-02 DIAGNOSIS — E119 Type 2 diabetes mellitus without complications: Secondary | ICD-10-CM

## 2010-10-02 DIAGNOSIS — E349 Endocrine disorder, unspecified: Secondary | ICD-10-CM

## 2010-10-02 LAB — COMPREHENSIVE METABOLIC PANEL
ALT: 22 U/L (ref 0–53)
AST: 20 U/L (ref 0–37)
Alkaline Phosphatase: 73 U/L (ref 39–117)
Creatinine, Ser: 3.1 mg/dL — ABNORMAL HIGH (ref 0.4–1.5)
Sodium: 140 mEq/L (ref 135–145)
Total Bilirubin: 0.8 mg/dL (ref 0.3–1.2)
Total Protein: 6.8 g/dL (ref 6.0–8.3)

## 2010-10-02 LAB — CBC WITH DIFFERENTIAL/PLATELET
Basophils Absolute: 0 10*3/uL (ref 0.0–0.1)
HCT: 43.2 % (ref 39.0–52.0)
Hemoglobin: 14.7 g/dL (ref 13.0–17.0)
Lymphs Abs: 1.3 10*3/uL (ref 0.7–4.0)
MCV: 88.5 fl (ref 78.0–100.0)
Monocytes Absolute: 0.8 10*3/uL (ref 0.1–1.0)
Neutro Abs: 4 10*3/uL (ref 1.4–7.7)
Platelets: 187 10*3/uL (ref 150.0–400.0)
RDW: 14.9 % — ABNORMAL HIGH (ref 11.5–14.6)

## 2010-10-02 LAB — HEMOGLOBIN A1C: Hgb A1c MFr Bld: 8.5 % — ABNORMAL HIGH (ref 4.6–6.5)

## 2010-10-02 LAB — TESTOSTERONE: Testosterone: 167.83 ng/dL — ABNORMAL LOW (ref 350.00–890.00)

## 2010-10-02 LAB — PSA: PSA: 0.49 ng/mL (ref 0.10–4.00)

## 2010-10-02 LAB — LIPID PANEL
Cholesterol: 144 mg/dL (ref 0–200)
LDL Cholesterol: 66 mg/dL (ref 0–99)

## 2010-10-04 ENCOUNTER — Ambulatory Visit (INDEPENDENT_AMBULATORY_CARE_PROVIDER_SITE_OTHER): Payer: Medicare Other | Admitting: Family Medicine

## 2010-10-04 ENCOUNTER — Encounter: Payer: Self-pay | Admitting: Family Medicine

## 2010-10-04 DIAGNOSIS — E291 Testicular hypofunction: Secondary | ICD-10-CM

## 2010-10-04 DIAGNOSIS — E119 Type 2 diabetes mellitus without complications: Secondary | ICD-10-CM

## 2010-10-04 DIAGNOSIS — N182 Chronic kidney disease, stage 2 (mild): Secondary | ICD-10-CM

## 2010-10-04 MED ORDER — TESTOSTERONE ENANTHATE 200 MG/ML IM SOLN
200.0000 mg | INTRAMUSCULAR | Status: DC
  Administered 2010-10-04 (×2): 200 mg via INTRAMUSCULAR

## 2010-10-04 NOTE — Assessment & Plan Note (Addendum)
Continue 250mg  q4wks for now.  I will d/w Dr. Allena Katz and then notify pt.

## 2010-10-04 NOTE — Patient Instructions (Signed)
Keep your meds as they are and I'll call you after I talk with Dr. Allena Katz.  Take care.

## 2010-10-04 NOTE — Assessment & Plan Note (Addendum)
Continue to work on diet and no other changes in meds now (other than gradual titration of lantus). Labs d/w pt.

## 2010-10-04 NOTE — Progress Notes (Signed)
Renal disease- d/w pt.  Off NSAIDS.  Seeing Dr. Allena Katz at renal.  He has cut out bananas since the lab draw and had a list of low K foods. He is able to get by with tylenol for knee pain.  He had labs done recently with renal with Cr ~2.5 per patient.    Low T- feels less fatigue on replacement.  See plan below.  Labs reviewed with patient.   Diabetes:  Using medications without difficulties: yes Hypoglycemic episodes: no Hyperglycemic episodes: occ Blood Sugars averaging: up to 161 in AM. This AM 119.  Usually ~100-120 if "not cheating." He has been working to cut out sweet tea.  "I've been cheating on my diet." Inc lantus by unit per day until <130.    PMH and SH reviewed  Meds, vitals, and allergies reviewed.   ROS: See HPI.  Otherwise negative.    GEN: nad, alert and oriented HEENT: mucous membranes moist NECK: supple CV: rrr. PULM: ctab, no inc wob ABD: soft, +bs EXT: no edema- in compression stockings SKIN: no acute rash

## 2010-10-04 NOTE — Assessment & Plan Note (Signed)
I will d/w renal.

## 2010-10-05 ENCOUNTER — Telehealth: Payer: Self-pay | Admitting: Family Medicine

## 2010-10-05 ENCOUNTER — Encounter: Payer: Self-pay | Admitting: Family Medicine

## 2010-10-05 DIAGNOSIS — N184 Chronic kidney disease, stage 4 (severe): Secondary | ICD-10-CM | POA: Insufficient documentation

## 2010-10-05 NOTE — Assessment & Plan Note (Signed)
Likely that his Cr is between 2.5 and 3.1.  I didn't change meds today re: renal status.  I will d/w renal and then contact the patient. I didn't recheck K today as he was recently started on low K diet and the elevation was minimal.  D/w pt.

## 2010-10-05 NOTE — Telephone Encounter (Signed)
I have paged Dr. Allena Katz and I will await return call.

## 2010-10-08 ENCOUNTER — Telehealth: Payer: Self-pay | Admitting: Family Medicine

## 2010-10-08 DIAGNOSIS — E291 Testicular hypofunction: Secondary | ICD-10-CM

## 2010-10-08 NOTE — Telephone Encounter (Signed)
See following note. 

## 2010-10-08 NOTE — Telephone Encounter (Signed)
I called but pt wasn't available.  I'll try to call tomorrow. I talked with Dr. Allena Katz with renal this AM.  He is aware of recent labs and will fu the K of 5.2 along with the Cr of 3.1.  We agreed that it was reasonable to continue with testosterone replacement in this patient.  I'll talk to patient about increasing his dose from 250mg  q4wks and setting up future labs.

## 2010-10-09 NOTE — Telephone Encounter (Signed)
I called and LMOVM.

## 2010-10-10 ENCOUNTER — Encounter: Payer: Self-pay | Admitting: Family Medicine

## 2010-10-10 NOTE — Telephone Encounter (Signed)
Patient came by the office since they had missed your call.  I relayed the info and said you would likely try to phone again today.  They said they would be home this afternoon.

## 2010-10-10 NOTE — Telephone Encounter (Signed)
I called him and we talked about options.  His T level was low, but he was several weeks post date on the level.   His fatigue is improved on the current dose and the effect lasts for about 4 weeks.  We agreed not to change his dose.  He'll notify me of problems in the meantime.  Will check routine labs in ~6 months.  He had fu with renal this Am re:K/Cr.

## 2010-10-17 ENCOUNTER — Other Ambulatory Visit: Payer: Self-pay | Admitting: Internal Medicine

## 2010-10-18 ENCOUNTER — Ambulatory Visit (INDEPENDENT_AMBULATORY_CARE_PROVIDER_SITE_OTHER): Payer: Medicare Other | Admitting: *Deleted

## 2010-10-18 DIAGNOSIS — Z95810 Presence of automatic (implantable) cardiac defibrillator: Secondary | ICD-10-CM

## 2010-10-18 DIAGNOSIS — I428 Other cardiomyopathies: Secondary | ICD-10-CM

## 2010-10-18 DIAGNOSIS — I5022 Chronic systolic (congestive) heart failure: Secondary | ICD-10-CM

## 2010-10-19 ENCOUNTER — Encounter: Payer: Self-pay | Admitting: Family Medicine

## 2010-10-25 NOTE — Progress Notes (Signed)
icd remote w/icm  

## 2010-10-30 NOTE — Assessment & Plan Note (Signed)
OFFICE VISIT   [name], [name]  DOB:  [birth year]                                        August 30, 2008  CHART #:  [id number]   The patient returned today for further evaluation for consideration of  coronary artery bypass graft surgery.  His complete history and physical  is unchanged from his new patient consultation of August 23, 2008.  I did  discuss his case with Dr. Heloise Purpura concerning the large cystic mass  on the inferior pole of his left kidney.  Dr. Laverle Patter did not feel that  this should prevent him from having coronary artery bypass surgery and  that this could be treated in a few months after he recovers from bypass  surgery.  He saw no evidence of metastatic disease on his MRI and felt  that the disease is limited to his kidney.  The patient said that he has  been stable since I saw him.  He has had no new complaints.   PHYSICAL EXAMINATION:  VITAL SIGNS:  Today, his blood pressure is  146/86, pulse is 74 and regular, his respiratory rate is 18 and  unlabored.  Oxygen saturation on room air is 96%.  GENERAL:  He looks well.  CARDIAC:  Regular rate and rhythm with normal heart sounds.  LUNGS:  Clear.  EXTREMITIES:  There is no peripheral edema.   IMPRESSION:  The patient is ready to have coronary artery bypass  surgery.  I discussed the operative procedure with he and his family.  We discussed the alternatives, benefits, and risks including, but not  limited to bleeding, blood transfusion, infection, stroke, myocardial  infarction, graft failure, renal failure, and death.  He understands and  would like to proceed with surgery as quickly as possible.  We will plan  to do this next week.   Evelene Croon, M.D.  Electronically Signed   BB/MEDQ  D:  08/30/2008  T:  08/31/2008  Job:  [id number]

## 2010-10-30 NOTE — Consult Note (Signed)
[name], [name] NO.:  [id number]   MEDICAL RECORD NO.:  [id number]          PATIENT TYPE:  INP   LOCATION:  2007                         FACILITY:  [facility]   PHYSICIAN:  [name], MD, FACCDATE OF BIRTH:  Aug 21, 1925   DATE OF CONSULTATION:  11/18/2008  DATE OF DISCHARGE:                                 CONSULTATION   CONSULTING SURGEON:  Sharlet Salina T. Hoxworth, MD   REQUESTING PHYSICIAN:  Helane Rima, MD   REASON FOR CONSULTATION:  Recurrent small bowel obstruction.   HISTORY OF PRESENT ILLNESS:  [name] is an 77 year old male  patient with multiple medical problems.  He has a prior history of  mechanical small bowel obstruction secondary to retention of an ingested  bone either fish or chicken in nature.  This was in the 1990s.  He  subsequently underwent exploratory laparotomy to treat this.  After that  in the early 2000 to about 2002, the patient had an additional small  bowel obstruction treated medically.  Since that time, he has done well  regarding abdominal issues.  In January 2010, he was admitted with  epigastric chest pain felt to be of GI etiology.  At that time, he felt  like he was having severe reflux, chest plain, a lot of nausea,  uncertain if at that time, he had a partial small bowel obstruction that  was attempting to evolve.  In March 2010, he was readmitted with chest  pain and was found to have severe three-vessel CAD.  He subsequently  underwent a three-vessel CABG.  Cardiac catheterization also revealed  severe LV dysfunction with an EF of 25%, global hypokinesis, and he has  not had a repeat echo since CABG.  Since the open heart surgery  admission, he was readmitted to the hospital in mid May 2010 with a  small bowel obstruction that resolved with medical therapy.  In  describing the symptoms, he reports that symptoms essentially began  abruptly.  He had not had any minor GI symptoms such as postprandial  bloating or  nausea.  He was on vacation at [facility], [facility], [facility], began having  fullness and abdominal pain, this increased in severity upon the drive  back from [place].  By the time he reached [facility], the pain  was very severe and he was very nauseous.  In regards to this admission  symptoms, they also began abruptly.  Yesterday's, two-view x-ray was  consistent with small bowel obstruction.  The patient has subsequently  been placed on bowel rest with NG tube decompression and now his  symptoms are more consistent with partial small bowel obstruction.  Today, he has passed flatus, but has had a tiny bowel movement, although  his abdomen is, otherwise, quiescent without any bowel sounds.  The  patient states that he does not have any chronic issues with  constipation and actually over the past 4-5 months his stools have been  looser and more regular.  Surgical consultation has been requested.   REVIEW OF SYSTEMS:  As per the history of present illness; otherwise,  all review of systems categories are negative or  noncontributory.   PAST MEDICAL HISTORY:  1. Stable CAD with severe LV dysfunction with an EF of 25%.  2. Diabetes mellitus on insulin.  3. GERD.  4. Pancreatitis.  5. Irritable bowel syndrome.  6. Degenerative disk disease.  7. Dyslipidemia.  8. Gout.  9. Known left adrenal mass on 10/100 complex cystic mass involving      left pole concerning for renal cell carcinoma, follow up with Dr.      Dr. Heloise Purpura.  Urology is pending next week.  10.Chronic kidney disease, stable.   PAST SURGICAL HISTORY:  1. CABG x3 in March 2010.  His excision of a right medial calf      melanotic dysplastic lesion in 2008.  2. Exploratory laparotomy with small bowel resection for mechanical      obstruction in the 1990s.  3. Cholecystectomy.   SOCIAL HISTORY:  He is married.  No alcohol.  No tobacco.   FAMILY HISTORY:  Noncontributory.   ALLERGIES:  VIOXX, CELEBREX, and CRESTOR.    MEDICATIONS:  GENERAL:  While hospitalized include p.r.n. morphine,  Lovenox for DVT prophylaxis, Protonix IV, Lopressor IV sliding scale  insulin, Lantus insulin, and IV fluids to the event.  Pleasant male  patient reporting difficulty with recurrent small bowel obstruction  symptoms.  VITAL SIGNS:  Temperature 97.2, BP 119/69, pulse 75 and regular,  respirations 20.  PSYCH:  The patient is alert and oriented x3.  Mood and affect  appropriate to current situation.  NEUROLOGIC:  Cranial nerves II through XII are grossly intact.  He is  moving all extremities x4 without any gross focal motor neurological  deficits.  EYES:  Sclerae are noninjected, nonicteric bilaterally.  EARS, NOSE, AND THROAT:  Symmetrical.  No otorrhea.  Nose is midline.  No rhinorrhea.  Mucous membranes are pink and moist.  CHEST:  Bilateral lung sounds clear to auscultation, respiratory effort  is nonlabored.  He is on room air.  CARDIOVASCULAR:  Heart sounds S1 and S2.  No murmurs, rubs, or gallops.  No JVD.  No peripheral edema.  Pulses regular.  Telemetry demonstrates  first-degree AV block, rate in the 70s.  ABDOMEN:  Obese but soft.  No bowel sounds are auscultated.  Abdomen is  nontender to palpation.  He has NG in placed at level of suction with  moderate volume bilious returns.  He has midline incision from prior  procedure, it is intact without any evidence of herniation.  He has a  soft umbilical hernia, which is nontender but nonreducible.  EXTREMITIES:  Symmetrical in appearance without cyanosis or clubbing.   LABORATORY DATA:  Sodium 142, potassium 4.0, chloride 109, CO2 of 28,  glucose 164, BUN 16, creatinine 1.40.  White count 7600, hemoglobin 12,  platelets 182,000.  Diagnostic plain abdominal x-ray since admission  again demonstrated small bowel obstruction, which is now improving.  He  still has dilated loops of small bowel, but also has air in the colon on  today's film.  MRI of February 2010  show a 10-cm complex cystic mass  concerning for neoplasm.  CT of the abdomen and pelvis from May 2010  further describe this mass as being Bosniak level III cyst.   IMPRESSION:  1. Recurrent small bowel obstruction.  2. Large unclarified left renal cyst and mass concerning for a      neoplastic process, workup pending per Dr. Laverle Patter.  3. Recent coronary artery bypass graft procedure.  Cardiology has okay      the  patient for OR this admission if indicated.  4. Diabetes mellitus.   PLAN:  1. Dr. Johna Sheriff to review the chart and examine the patient and      reviewed CT scans and other scans.  He will determine if and when      the patient may need to undergo operative intervention for      recurrent bowel obstructions, so far the patient needs to be      improving with medical management.   If operative intervention is warranted, may need to discuss with Dr.  Laverle Patter and the patient, Dr. Laverle Patter is not actually evaluated the patient  yet.  He may need to come see the patient this admission in the event  and do operative procedures indicated.      Allison L. Demetria Pore, MD, [facility]  Electronically Signed    ALE/MEDQ  D:  11/18/2008  T:  11/19/2008  Job:  [id number]   cc:   Lonia Blood, M.D.  Heloise Purpura, MD

## 2010-10-30 NOTE — Op Note (Signed)
NAMELARWENCE, [name]            ACCOUNT NO.:  [id number]   MEDICAL RECORD NO.:  [id number]          PATIENT TYPE:  AMB   LOCATION:  DSC                          FACILITY:  [facility]   PHYSICIAN:  Cherylynn Ridges, M.D.    DATE OF BIRTH:  December 02, 1925   DATE OF PROCEDURE:  DATE OF DISCHARGE:                               OPERATIVE REPORT   PREOPERATIVE DIAGNOSIS:  Dysplastic or atypical melanocytic lesion of  the right lower extremity.   POSTOPERATIVE DIAGNOSIS:  Dysplastic or atypical melanocytic lesion of  the right lower extremity.   PROCEDURE:  Excision of 1 cm right medial calf lower extremity  melanocytic dysplastic lesion.   SURGEON:  Cherylynn Ridges, M.D.   ANESTHESIA:  Monitored anesthesia care with 9 mL of local.   ESTIMATED BLOOD LOSS:  Less than 2 mL.   COMPLICATIONS:  None.   CONDITION:  Stable.   FINDINGS:  The lesion appeared to be atypical macroscopically; however,  pathology will be pending.  The patient had no lymph nodes noted at the  time of examination in our office.   INDICATIONS FOR OPERATION:  The patient had a biopsy of right lower  extremity lesion which showed a pattern of atypical junctional  lentiginous melanocytic proliferation, and he was screened for excision.   OPERATION:  The patient was taken to the operating room, placed on the  table in supine position.  After an adequate amount of IV sedation was  given, his right lower extremity was prepped and draped in the usual  sterile manner.   We marked the incision along the skin lines in order to provide closure  without tension.  We got at least a 1 cm margin around the edges of the  entire lesion.  We anesthetized the area using a 25-gauge needle and 1%  Xylocaine  without epinephrine.  We made an incision, oval incision, about a total  length of 3.5 cm long.  We excised the full thickness skin into  subcutaneous tissue.  We obtained hemostasis and then closed with  interrupted 4-0 nylon  sutures.  All counts were correct.  Hemostasis was  adequate.  The patient had a sterile dressing applied.      Cherylynn Ridges, M.D.  Electronically Signed     JOW/MEDQ  D:  11/14/2006  T:  11/14/2006  Job:  [id number]   cc:   Arta Silence, MD  [facility]. Danella Deis, M.D.

## 2010-10-30 NOTE — Discharge Summary (Signed)
NAMEEDUAR, KUMPF NO.:  [id number]   MEDICAL RECORD NO.:  [id number]          PATIENT TYPE:  INP   LOCATION:  4741                         FACILITY:  [facility]   PHYSICIAN:  Gordy Savers, MDDATE OF BIRTH:  November 23, 1925   DATE OF ADMISSION:  10/20/2008  DATE OF DISCHARGE:  10/26/2008                               DISCHARGE SUMMARY   FINAL DIAGNOSIS:  Partial small bowel obstruction.   ADDITIONAL DIAGNOSES:  1. Type 2 diabetes.  2. History of gout.  3. Coronary artery disease.  4. Hypertension.   DISCHARGE MEDICATIONS:  1. Omeprazole 20 mg daily.  2. Aspirin 325 mg daily.  3. Carvedilol 6.25 mg twice daily.  4. Lantus insulin 10 units at bedtime.   HISTORY OF PRESENT ILLNESS:  The patient is an 77 year old gentleman  with history of coronary artery disease and recent CABG 6 weeks prior to  admission.  He also has a history of prior small bowel obstruction in  2002.  This was treated medically and did not require a surgery.  The  patient was well until the afternoon of admission when he developed  abdominal pain associated with nausea and vomiting.  He presented to the  Emergency Department where a CT scan was compatible with an early small  bowel obstruction.  In the ED, a nasogastric tube was placed and he was  admitted for further evaluation and treatment of his small bowel  obstruction.   LABORATORY DATA AND HOSPITAL COURSE:  The patient was admitted to the  hospital where he was initially support with IV fluids.  A nasogastric  tube was inserted and he was treated with intermittent suction.  Radiographically, the small bowel obstruction steadily improved.  Should  be noted that he has a known left renal mass that is scheduled for  urological evaluation in the near future.  His blood sugars were  monitored carefully during the hospital period and he was maintained on  Lantus 10 units daily.  His coronary artery disease remained stable.  The  hospital course was marked by steady improvement.  At time of  discharge, he was tolerating a regular diet without difficulty.  He is  having normal bowel movements and was weak, but ambulatory.  White count  was 5.9, H and H 11 and 30.2.  Final KUB revealed some mild air-fluid  levels in the small bowel, but no evidence of distention.   DISPOSITION:  The patient would be discharged today with office followup  within the next week.  He was slowly increase his level of activity.  He  would be discharged on a no-added salt, heart-healthy diet.   CONDITION ON DISCHARGE:  Improved.      Gordy Savers, MD  Electronically Signed     PFK/MEDQ  D:  10/26/2008  T:  10/26/2008  Job:  [id number]

## 2010-10-30 NOTE — Assessment & Plan Note (Signed)
OFFICE VISIT   [name], [name]  DOB:  [birth year]                                        October 07, 2008  CHART #:  [id number]   The patient returned today for followup, status post coronary artery  bypass graft surgery x4 on September 05, 2008.  He had an uncomplicated  postoperative course.  His preoperative ejection fraction was 25% with  anterior akinesis and global hypokinesis.  Since discharge, he said he  has been feeling fairly well and has started cardiac rehab.  He has had  mild chest wall soreness and no shortness of breath.   PHYSICAL EXAMINATION:  VITAL SIGNS:  His blood pressure is 145/80 and  his pulse is 78 and regular.  Respiratory rate is 18 and unlabored.  Oxygen saturation on room air is 95%.  GENERAL:  He looks well.  CARDIAC:  Regular rate and rhythm with normal heart sounds.  LUNGS:  Clear.  CHEST:  The chest incision is healing well and the sternum is stable.  EXTREMITIES:  He leg incision is healing well.  There is no lower  extremity edema.   Followup chest x-ray today shows near complete resolution of bibasilar  atelectasis and pleural effusion seen on previous chest x-ray.  There is  very small residual left pleural effusion.   His medications are glucosamine 2000 mg daily, colchicine 0.6 mg b.i.d.,  aspirin 325 mg daily, omeprazole 40 mg daily, Tylenol p.r.n. pain,  Lantus 10 units daily, and carvedilol 6.25 mg b.i.d.  He was sent home  on oral hypoglycemic agents, but said that his primary physician Dr.  Hetty Ely discontinued those recently and started him on Lantus.   IMPRESSION:  Overall, the patient is making a very good recovery  following his surgery.  I encouraged him to continue walking as much as  possible and to finish cardiac rehab.  He seems very motivated to modify  his cardiac risk factors.  I told him he could return to driving a car,  but should refrain from lifting anything heavier than 10 pounds for a  total of 3 months from date of surgery.  He has a followup appointment  in mid June with Dr. Heloise Purpura concerning his left kidney mass.  He  will continue to follow up with Dr. Antoine Poche and will contact me if he  develops any problems with his incisions.   Evelene Croon, M.D.  Electronically Signed   BB/MEDQ  D:  10/07/2008  T:  10/08/2008  Job:  [id number]   cc:   Heloise Purpura, MD  [name], MD, [facility]

## 2010-10-30 NOTE — Op Note (Signed)
[name], EARLY NO.:  [id number]   MEDICAL RECORD NO.:  [id number]          PATIENT TYPE:  INP   LOCATION:  2307                         FACILITY:  [facility]   PHYSICIAN:  Evelene Croon, M.D.     DATE OF BIRTH:  02-12-1926   DATE OF PROCEDURE:  09/05/2008  DATE OF DISCHARGE:                               OPERATIVE REPORT   PREOPERATIVE DIAGNOSIS:  Severe three-vessel coronary artery disease.   POSTOPERATIVE DIAGNOSIS:  Severe three-vessel coronary artery disease.   OPERATIVE PROCEDURE:  Mediastinotomy, extracorporeal circulation,  coronary artery bypass graft surgery x4 using a left internal mammary  artery graft to the left anterior descending coronary artery, with a  saphenous vein graft to the first diagonal branch of the LAD, a  saphenous vein graft to the obtuse marginal branch of the left  circumflex coronary artery, and a saphenous vein graft to the posterior  descending branch of the right coronary artery.  Endoscopic vein  harvesting from the right leg.   ATTENDING SURGEON:  Evelene Croon, MD   ASSISTANT:  Coral Ceo, PA-C   ANESTHESIA:  General endotracheal.   CLINICAL HISTORY:  This patient is an 77 year old gentleman with a long  history of diabetes, but no prior cardiac disease, who is admitted in  January 2010, with complaints of severe substernal chest pain.  Apparently, an echocardiogram showed left bundle-branch block and he was  ruled out for myocardial infarction.  He was diagnosed with pancreatitis  and gout and was seen by GI Medicine.  He underwent an echocardiogram  during that time which showed low ejection fraction, although there was  a poor acoustic window.  He was referred to Dr. Antoine Poche after  recovering from his pancreatitis and had a stress test performed on  August 05, 2008, which was abnormal with marked LV dysfunction.  There  was a small fixed perfusion defect in the distal anterior wall.  There  is no evidence of  ischemia.  The patient underwent cardiac  catheterization on August 17, 2008, which showed severe three-vessel  disease with an ejection fraction of 25%.  There was an anterior  akinesis and global hypokinesis.  The left main had a distal 25%  stenosis.  The LAD had a long proximal 25% stenosis, then 90% mid vessel  stenosis followed by tandem 80% lesions.  The distal vessel was  diffusely narrowed with diabetic-type vessels.  The first diagonal was a  large vessel with long proximal 80% stenosis.  The second diagonal had  no disease.  The left circumflex had proximal 80% stenosis and a 50% mid  stenosis before a large marginal branch which had about 80% stenosis in  it.  The right coronary artery was a large dominant vessel with a long  proximal 30% stenosis and then a 50% stenosis before the posterior  descending branch.  There is about 80% stenosis in the posterior  descending branch.  The patient had a CT scan of the abdomen performed  during his hospitalization, which has shown some edema in the head of  the pancreas suggestive of pancreatitis.  Also showed a large  cyst on  the anterior pole of left kidney measuring 10 cm and a second cyst in  the upper pole of left kidney.  He subsequently underwent MRI of the  abdomen which showed that the cyst on the anterior pole of left kidney  was a complex cystic mass suggesting malignancy.  He was seen by  Urology.  I discussed the case with Urology.  He felt that this may  require a surgical treatment after the patient recovers from coronary  bypass surgery.  He did not feel there was an urgency to treat this.  After I saw the patient in the office and reviewed the catheterization  findings and his history, I agreed with Dr. Antoine Poche that coronary  artery bypass graft surgery was probably the best treatment for  improving his lifestyle and functional status and preventing further  ischemia and infarction.  I discussed the operative procedure  with the  patient including alternatives, benefits, and risks.  We discussed the  increased risk given his age and low ejection fraction.  Also discussed  the risks of bleeding, blood transfusion, infection, stroke, myocardial  infarction, graft failure, organ failure, heart block requiring  permanent pacemaker, and death.  He and his wife and daughter understood  all these and agreed to proceed.   OPERATIVE PROCEDURE:  The patient was taken to the operating room and  placed on the table in supine position.  After induction of general  endotracheal anesthesia, a Foley catheter was placed in the bladder  using sterile technique.  Then the chest, abdomen, and both lower  extremities were prepped and draped in usual sterile manner.  The chest  was entered through a median sternotomy incision and the pericardium was  opened in the midline.  Examination of the heart showed good right  ventricular contractility.  The ascending aorta had no palpable plaques  in it.   A transesophageal echocardiogram was performed by anesthesiology and  read by Dr. Hart Robinsons.  This showed severe left ventricular  dysfunction with an ejection fraction of probably around 25-30%.  There  is no significant mitral regurgitation and no aortic stenosis or  insufficiency.   Then, the left internal mammary artery was harvested from the chest wall  as a pedicle graft.  This was a medium-caliber vessel with excellent  blood flow through it.  At the same time, a segment of greater saphenous  vein was harvested from the right leg using endoscopic vein harvest  technique.  This vein was of medium size and good quality.   Then, the patient was heparinized and when an adequate activated  clotting time was achieved, the distal ascending aorta was cannulated  using a 20-French aortic cannula for arterial inflow.  Venous outflow  was achieved using a 2-stage venous cannula through the right atrial  appendage.  An  antegrade cardioplegia and vent cannula was inserted in  the aortic root.   At first, the patient was placed on cardiopulmonary bypass and distal  coronaries identified.  The LAD was a medium-sized vessel that had some  distal disease in it.  This is a typical diabetic-type vessel.  The  first diagonal was a large vessel that was heavily diseased proximally,  but the remainder of the vessel was free of disease.  The obtuse  marginal was a moderate-size vessel that had some segmental disease and  looked like a typical diabetic vessel.  The right coronary artery gave  also a long posterior descending branch that bifurcated.  One of the sub  branches reached the apex and this was the one that was chosen for  grafting.  The other sub branch was lying beneath the large epicardial  vein.  The posterolateral was a smaller vessel.   Then, the aorta was cross clamped and 1000 mL of cold blood antegrade  cardioplegia was administered in the aortic root with quick arrest of  the heart.  Systemic hypothermia to 28 degrees centigrade and topical  hypothermia with iced saline was used.  A temperature probe was placed  in the septum and insulating pad in the pericardium.   The first distal anastomosis was then performed to the obtuse marginal  branch.  The internal diameter of this vessel was about 1.75 mm.  The  conduit used was a segment of greater saphenous vein, and the  anastomosis was performed in an end-to-side manner using continuous 7-0  Prolene suture.  Flow was noted through the graft and was excellent.   The second distal anastomosis was performed to the posterior descending  coronary artery.  This was grafted distally.  The internal diameter here  was about 1.5-1.6 mm.  The conduit used was a second segment of greater  saphenous vein and the anastomosis was performed in an end-to-side  manner using a continuous 7-0 Prolene suture.  Flow was noted through  the graft and was  excellent.   The third distal anastomosis was performed to the diagonal branch.  The  internal diameter was about 1.75 mm.  The conduit used was a third  segment of greater saphenous vein and the anastomosis performed in an  end-to-side manner using continuous 7-0 Prolene suture.  Flow was noted  through the graft and was excellent.  Then, another dose of cardioplegia  was given down the vein grafts and in the aortic root.   The fourth distal anastomosis was performed to the mid portion of left  anterior descending coronary artery.  The internal diameter was about  1.75 mm.  The conduit used was a left internal mammary graft and was  brought through an opening of the left pericardium, anterior to the  phrenic nerve.  It was anastomosed to the LAD in an end-to-side manner  using continuous 8-0 Prolene suture.  The pedicle was sutured to the  epicardium with 6-0 Prolene sutures.  The patient was rewarmed at 37-  degree centigrade.  With the cross-clamp in place, the three proximal  vein graft anastomoses were performed to the aortic root in an end-to-  side manner using continuous 6-0 Prolene suture.  Then, the clamp was  removed from the mammary pedicle.  There was rapid warming of the  ventricular septum and return of spontaneous ventricular fibrillation.  The cross-clamp was removed with time of 90 minutes and the patient  spontaneously converted to sinus rhythm.  The proximal and distal  anastomoses appeared hemostatic and allowed the graft satisfactory.  Graft mark was placed around the proximal anastomoses.  Two temporary  right ventricular and right atrial pacing wires placed and brought  through the skin.   Then the patient rewarmed to 37 degrees centigrade.  He was weaned from  cardiopulmonary bypass on milrinone at 0.3 mcg/kg/minute and dopamine at  3 mcg/kg/minute.  Cardiac function appeared much improved by  echocardiogram.  Cardiac output was 7 L per minute.  Protamine was  then  given and the venous and aortic cannulas removed without difficulty.  Hemostasis was achieved.  Three chest tubes were placed with the  tube in  the posterior pericardium, one in the left pleural space and one in the  anterior mediastinum.  The sternum was then closed with double #6  stainless steel wires.  The fascia was closed with continuous #1 Vicryl  suture.  Subcutaneous tissue was closed with continuous 2-0 Vicryl and  the skin with a 3-0 Vicryl subcuticular closure.  The lower extremity  vein harvest site was closed in layers in similar manner.  The sponge,  needle, and instruments counts were correct according to the scrub  nurse.  Dry sterile dressing applied over the incisions around the chest  tubes with short Pleur-Evac suction.  The patient remained  hemodynamically stable and was transported to the SICU in guarded but  stable condition.      Evelene Croon, M.D.  Electronically Signed     BB/MEDQ  D:  09/05/2008  T:  09/06/2008  Job:  [id number]   cc:   [name], MD, [facility]

## 2010-10-30 NOTE — H&P (Signed)
NAMEKEVONTAY, BURKS NO.:  [id number]   MEDICAL RECORD NO.:  [id number]          PATIENT TYPE:  INP   LOCATION:  2007                         [facility]AC[facility]:  [facility]   PHYSICIAN:  Lonia Blood, M.D.       DATE OF BIRTH:  [birth year]   DATE OF ADMISSION:  11/17/2008  DATE OF DISCHARGE:                              HISTORY & PHYSICAL   PRIMARY CARE PHYSICIAN:  Arta Silence, MD   CHIEF COMPLAINT:  Abdominal pain, vomiting.   HISTORY OF PRESENT ILLNESS:  Mr. Jaworski is an 77 year old gentleman  with recent history of small-bowel obstruction who presents to the  emergency room with complaints of abdominal distention, abdominal pain  and vomiting.  He had a recent hospitalization in May with similar  complaints and at that time his bowel obstruction resolved with  nasogastric suctioning and bowel rest.  He has not followed up with the  surgeon and would like to take care of his problem conservatively for  now.   PAST MEDICAL HISTORY:  1. Chronic kidney disease, renal mass of unclear etiology, workup in      progress.  2. Ischemic cardiomyopathy with ejection fraction 25%.  3. Coronary artery disease.  4. Gastroesophageal reflux disease.  5. Pancreatitis.  6. Irritable bowel syndrome.  7. Degenerative disk disease.  8. Obesity.  9. Gout.  10.Hyperlipidemia.  11.Adrenal mass.  12.Diabetes mellitus type 2.  13.Melanoma.   HOME MEDICATIONS:  Testosterone injections, omeprazole, Tylenol,  aspirin, Coreg, Lantus.   ALLERGIES:  The patient is allergic to VIOXX, CELEBREX, and CRESTOR.   FAMILY HISTORY:  The patient is married.  He is a retired Social research officer, government at [employer].  No tobacco abuse.   FAMILY HISTORY:  Not contributory secondary to her age.   REVIEW OF SYSTEMS:  Negative for fevers, chills or sweats.  Positive for  some joint pains.  All other systems per HPI were negative.   PHYSICAL EXAMINATION:  VITAL SIGNS:  Upon admission temperature 98.3,  heart rate 98, respiratory rate 18, blood pressure 149/86, saturation  90% on room air.  GENERAL:  The patient is awake, alert, in no acute distress.  HEAD:  Normocephalic, atraumatic.  EYES:  Pupils equal, round and reactive to light and accommodation.  Extraocular movements intact.  ABDOMEN:  Distended and soft.  Bowel sounds are present.  There is no  significant tenderness.  No rebound, no guarding.  SKIN:  Warm and dry without suspicious looking rashes.  PSYCHIATRIC:  The patient is alert, oriented and cooperative.   LABORATORY VALUES:  On admission sodium 138, potassium 3.8, chloride  105, bicarbonate 26, BUN 15, creatinine 1.4, glucose 285.  White blood  cell count 10.7, , hemoglobin 14.7, platelet count is 233.  I have  personally reviewed the KUB which shows small bowel loops with air-fluid  levels.   ASSESSMENT AND PLAN:  1. This is an 77 year old gentleman who presented with recurrent small-      bowel obstructions.  He seems to be responding to conservative      treatment.  We will admit and place him on intravenous  fluids,      bowel rest, continue nasogastric suctioning  and consider surgical      consultation tomorrow.  2. Chronic kidney disease.  The patient's creatinine is at baseline.      We will closely monitor intake and output and check daily      creatinine.  3. Ischemic cardiomyopathy.  The patient currently is without angina.      We will continue his beta blocker and monitor him closely.  4. Diabetes mellitus type 2.  We will continue the patient on Lantus      and sliding scale insulin while in house  5. DVT prophylaxis - will be done with lovenox      Lonia Blood, M.D.  Electronically Signed     SL/MEDQ  D:  11/17/2008  T:  11/18/2008  Job:  [id number]   cc:   Arta Silence, MD

## 2010-10-30 NOTE — Discharge Summary (Signed)
NAMEVIJAY, [name]            ACCOUNT NO.:  [id number]   MEDICAL RECORD NO.:  [id number]          PATIENT TYPE:  INP   LOCATION:  3736                         FACILITY:  [facility]   PHYSICIAN:  Valerie A. Felicity Coyer, MDDATE OF BIRTH:  [birth year]   DATE OF ADMISSION:  07/10/2008  DATE OF DISCHARGE:  07/13/2008                               DISCHARGE SUMMARY   DISCHARGE DIAGNOSIS:  1. Epigastric pain consistent with mild pancreatitis, etiology      unclear, but symptomatically resolved.  Abdominal ultrasound      pending at time of dictation.  Outpatient followup with GI.  2. Chronic renal insufficiency.  Baseline creatinine 1.6.  3. Hypertension.  4. Dyslipidemia, not on medications prior to admission with      triglycerides of 191 and HDL of 37.  Outpatient followup.  5. Type 2 diabetes.  Continue home medications as tolerated.   MEDICATIONS AT TIME OF DISCHARGE:  1. Glipizide 5 mg p.o. daily.  2. Glucosamine chondroitin 1 daily.  3. Tylenol 500 mg p.r.n.  4. Aspirin 325 mg daily.  5. Avandamet 4/500 p.o. b.i.d.  6. Testosterone 20 mg injection once monthly.  7. Zantac 75 mg b.i.d. p.r.n.  8. Colchicine 0.6 mg p.r.n.  9. Protonix 40 mg daily.  10.Darvocet 1 to 2 p.o. q.4 h. p.r.n. arthritis pain.   CONSULTATIONS:  [facility].   PROCEDURES:  1. An EGD on January 26, significant for Schatzki ring at the GE      junction and a 2 cm hiatal hernia, but otherwise normal      examination.  2. CT abdomen and pelvis without contrast performed July 12, 2008,      showing diffuse fatty liver infiltration and early changes for      acute pancreatitis of the head of the pancreas.  3. Abdominal ultrasound on July 13, 2008, done pending at time of      dictation.  Outpatient followup with GI and primary care physician.   The patient is instructed to discontinue his lisinopril and HCTZ,  antihypertensive combination until further followup with the primary  care physician, on  possibility that this is contributing to the  patient's current symptoms.  Hospital followup is scheduled with Dr.  Hetty Ely, primary care physician for July 26, 2008, at 10:30 a.m. to  review abdominal pain.  We will  also arrange for followup with GI  physician Dr. Stan Head, at the direction of GI for followup.  Consider need for MRCP or other evaluation of pancreas if persistent  symptoms.   HOSPITAL COURSE:  1. Abdominal pain.  The patient is an 77 year old gentleman, who is      status post cholecystectomy who came to the emergency room      complaining of bloating, indigestion, and nonspecific epigastric      and right upper quadrant pain.  Initial laboratory data was      unremarkable for etiology, but given his risk for coronary disease,      he was admitted for possible chest pain to rule out MI.  There is a      low suspicion  for cardiac etiology with further evaluation.      Cardiac enzymes negative.  EKG was without ischemic change and a 2D      echo was performed on July 11, 2008, which had poor acoustic      windows with overall mild decrease in LV function.  Further history      and evaluation prompted consideration of probable GI etiology for      his symptoms, and as this may have been gastritis given his      frequent use of aspirin and Aleve, his medications were placed on      hold and he was begun on a proton pump inhibitor in addition to his      home H2 blocker, which he had taken on p.r.n.  He was also seen in      consultation by GI service, Dr. Arlyce Dice on July 11, 2008, who      felt that he warranted an EGD for evaluation to rule out ulcer      disease as well as repeat labs and a CT abdomen and pelvis.  His      lipase now returned slightly elevated, confirming mild acute      pancreatitis which is what this CAT scan has also indicated.  By      this time, the patient had improved.  We continue holding his      lisinopril and HCTZ as possible med  side effects, as there was no      other apparent cause for his pancreatitis.  An ultrasound is being      performed to rule out in situ bile duct stones or biliary dilation.      The patient is status post cholecystectomy, consideration of an      MRCP to rule out other pancreatic mass will be pursued as an      outpatient if this ultrasound is negative.  We will also continue      holding his medications as noted here, as he is not tolerating p.o.      diet.  With minimal pain symptoms and reasonable control, he is      felt stable for discharge home with close outpatient followup as      described.  2. Other medical issues.  The patient's other medical issues are as      previously listed.  No changes in the medical regimen except as      listed and outpatient followup as described .  Greater than 30      minutes on the  date of discharge for planning coordination of      followup and tests.      Valerie A. Felicity Coyer, MD  Electronically Signed     VAL/MEDQ  D:  07/13/2008  T:  07/14/2008  Job:  [id number]

## 2010-10-30 NOTE — Discharge Summary (Signed)
[name], [name] NO.:  [id number]   MEDICAL RECORD NO.:  [id number]          PATIENT TYPE:  INP   LOCATION:  4710                         FACILITY:  [facility]   PHYSICIAN:  [name], MD, FACCDATE OF BIRTH:  [birth year]   DATE OF ADMISSION:  08/16/2008  DATE OF DISCHARGE:  08/18/2008                               DISCHARGE SUMMARY   PRIMARY CARDIOLOGIST:  [name], MD, [facility]   PRIMARY CARE PHYSICIAN:  Arta Silence, MD   PROCEDURES PERFORMED DURING HOSPITALIZATION:  1. Cardiac catheterization.      a.     Cardiac catheterization completed on August 17, 2008 per Dr.       [name], revealing severe three-vessel coronary artery       disease and ischemic cardiomyopathy with an EF of 25% with       anterior akinesis and global hypokinesis.   FINAL DISCHARGE DIAGNOSES:  1. Chest pain.      a.     Abnormal stress Myoview with marked global LV dysfunction       and a small fixed perfusion defect in the distal anterior wall on       August 05, 2008.      b.     Status post cardiac catheterization on August 17, 2008,       revealing severe three-vessel coronary artery disease with       ischemic cardiomyopathy, EF of 25% with anterior akinesis and       global hypokinesis.  2. Diabetes.  3. Hypertension.  4. Recent pancreatitis.  5. Gout.   HOSPITAL COURSE:  This is an 77 year old Caucasian male with no prior  cardiac history, who was recently admitted in January 2010 for  complaints of chest pain.  He was found to have some possible  pancreatitis and gout.  However, the patient had a followup as an  outpatient with Dr. Antoine Poche after having had an echocardiogram  revealing moderately reduced EF with hypokinesis of the inferior,  anterior, and posterior septal walls.  On stress Myoview, the patient  was found to have an abnormal study with marked LV dysfunction globally  and a small fixed perfusion defect in the distal anterior wall.  The  patient was subsequently admitted to have cardiac catheterization on  August 16, 2008, but creatinine was mildly elevated at 1.5.  Therefore, he  was hydrated prior to catheterization.   The patient did undergo cardiac catheterization per Dr. Antoine Poche after  being treated with hydration and Mucomyst overnight.  Dr. Antoine Poche  completed catheterization as described above.  Please see Dr. Jenene Slicker  thorough cardiac catheterization note for more details.  The patient  tolerated the procedure well without evidence of bleeding hematoma at  the insertion site.  He had no recurrence of chest discomfort or  shortness of breath.  It was found that the patient will be in need of  coronary artery bypass grafting and the patient will be followed as an  outpatient by Dr. Laneta Simmers and his colleagues to be evaluated and planned  for coronary artery bypass grafting.  In the interim, the patient  will  continue his medications prior to admission with the exception of his  Glucophage, which will be held for 48 hours and then resumed.  We have  added metoprolol 25 mg p.o. twice a day and aspirin 81 mg daily along  with nitroglycerin sublingual p.r.n. to his medication regimen.  The  patient will be called by Dr. Sharee Pimple office to schedule appointment as  an outpatient.   DISCHARGE LABORATORY DATA:  Sodium 140, potassium 3.9, chloride 106, CO2  of 27, BUN 11, creatinine 1.43, hemoglobin 13.0, hematocrit 6.1, and  platelets 183.   PHYSICAL EXAMINATION:  VITAL SIGNS:  Blood pressure 148/87, pulse 80,  respirations 20, O2 sat 94% on room air, and temperature 97.1.   DISCHARGE MEDICATIONS:  1. Metoprolol 25 mg twice a day (new prescription provided).  2. Aspirin 81 mg daily.  3. Delatestryl 200 mg intramuscularly once a month.  4. Glipizide 5 mg daily.  5. Avandamet 4/500 twice a day (this will be held for 2 days and then      resumed).  6. Glucosamine HCl 1000 mg 2 daily.  7. Levitra 10 mg at bedtime.  8.  Colchicine 0.6 mg tablets twice a day.  9. Zebrax 5% ointment as directed.  10.Cheratussin AC 100/10 one to two tablespoons as needed.  11.Darvocet-N 100 1-2 tablets every 4 hours as needed.  12.Omeprazole DR 40, 1 at breakfast daily.  13.Nitroglycerin 0.4 mg sublingual p.r.n. recurrent chest pain.   ALLERGIES:  NSAIDS, COX-2 INHIBITORS, OMEPRAZOLE, CELECOXIB, AND  PIROXICAM.   FOLLOWUP PLANS AND APPOINTMENTS:  1. The patient will be followed by Dr. Laneta Simmers or one of his colleagues      in their office for evaluation for coronary artery bypass grafting.      Their office will call him to make an appointment.  2. The patient will follow up with Dr. [name] on September 15, 2008      at 4:15 p.m.  3. The patient will follow up with Dr. Hetty Ely, his primary care      physician for continued medical management.  4. The patient has been given post cardiac catheterization      instructions with particular emphasis on the right groin site for      evidence of bleeding, hematoma, or signs of infection.  5. The patient has been advised to not take Avandamet for 2 days post      discharge and then resume as prior.  6. The patient has been given new prescription metoprolol 25 mg twice      a day and nitroglycerin 0.4 mg sublingual p.r.n.  Time spent with      the patient to include physician time 40 minutes.      [name], NP      [name], MD, [facility]  Electronically Signed   KML/MEDQ  D:  08/18/2008  T:  08/19/2008  Job:  [id number]   cc:   Evelene Croon, M.D.  Arta Silence, MD

## 2010-10-30 NOTE — Cardiovascular Report (Signed)
NAMETRAESON, DUSZA NO.:  [id number]   MEDICAL RECORD NO.:  [id number]          PATIENT TYPE:  INP   LOCATION:  4710                         FACILITY:  [facility]   PHYSICIAN:  [name], MD, FACCDATE OF BIRTH:  1926/01/21   DATE OF PROCEDURE:  08/17/2008  DATE OF DISCHARGE:                            CARDIAC CATHETERIZATION   PRIMARY CARE PHYSICIAN:  Arta Silence, MD   REASON FOR PRESENTATION:  Evaluate the patient with cardiomyopathy and  an abnormal Cardiolite.   PROCEDURE NOTE:  Left heart catheterization was performed via the right  femoral artery.  The artery was cannulated using anterior wall puncture.  A #5-French arterial sheath was inserted via the modified Seldinger  technique.  Preformed Judkins and a pigtail catheter were utilized.  The  patient tolerated the procedure well and left lab in stable condition.   RESULTS:  Coronaries:  Left main had distal 25% stenosis.  The LAD was  moderately calcified.  There was long proximal 25% stenosis.  There was  mid 90% stenosis and a tandem 80% lesion.  There was mid diffuse  nonobstructive disease following this.  The distal vessel was narrowed  with diffuse diabetic disease.  First diagonal was large with long  proximal 80% stenosis.  Second diagonal was a moderate size with luminal  irregularities.  The circumflex in the AV groove had proximal 80%  diffuse stenosis and mid 50% stenosis, mid obtuse marginal was large  with mid 80% stenosis.  Posterolateral was small with proximal long  subtotal stenosis.  The right coronary artery was a large dominant  vessel.  There was long proximal 30% stenosis followed by moderate  calcification.  There was 50% stenosis before the PDA.  There was mid  80% stenosis in the PDA.   Left ventriculogram:  The left ventriculogram was obtained in the RAO  projection.  The EF 25% with anterior akinesis and global hypokinesis.   CONCLUSION:  Severe 3-vessel coronary  artery disease.  Ischemic  cardiomyopathy.   PLAN:  The patient will be referred for cardiovascular surgery consult.       [name], MD, [facility]  Electronically Signed     JH/MEDQ  D:  08/17/2008  T:  08/18/2008  Job:  [id number]   cc:   Arta Silence, MD

## 2010-10-30 NOTE — H&P (Signed)
[name], [name] NO.:  [id number]   MEDICAL RECORD NO.:  [id number]          PATIENT TYPE:  INP   LOCATION:  4710                         FACILITY:  [facility]   PHYSICIAN:  [name], MD, FACCDATE OF BIRTH:  01-22-1926   DATE OF ADMISSION:  08/16/2008  DATE OF DISCHARGE:                              HISTORY & PHYSICAL   PRIMARY CARDIOLOGIST:  [name], MD, [facility]   PRIMARY CARE PHYSICIAN:  Arta Silence, MD   REASON FOR ADMISSION:  Cardiac catheterization.   HISTORY OF PRESENT ILLNESS:  This is an 77 year old Caucasian male with  no prior cardiac history who was recently admitted in January 2010 for  complaints of chest pain, was found to have some possible pancreatitis  and gout.  The patient has a history of hypertension and was  hypertensive during initial admission.  The patient was seen initially  by Dr. Antoine Poche on consultation and then had a followup office visit  thereafter secondary to an abnormal echocardiogram during  hospitalization.  The echocardiogram revealed a poor acoustic windows  suggesting his ejection fraction might be slightly to moderately reduced  with hypokinesis of the inferior, anterior, and posterior septal and  apical walls.  The patient subsequently did follow up with Dr. Antoine Poche  in his office on August 02, 2008, and a stress Myoview study was  ordered for evaluation.  The patient's stress Myoview was scheduled on  August 05, 2008 and found to be abnormal with marked LV dysfunction  globally with small fixed perfusion defect in the distal anterior wall.  No ischemia or significant TID was noted.  As a result of this, the  patient is scheduled for cardiac catheterization during hospitalization  with hydration prior to procedure secondary to elevated creatinine of  1.5 which was found on labs drawn on August 12, 2008.  The patient has  denied any complaints of chest pain, shortness of breath, nausea, or  vomiting since admission in January and has been asymptomatic.  The  patient is here for pre-cath hydration and labs.   REVIEW OF SYSTEMS:  Negative for chest pain, shortness of breath,  nausea, vomiting, diaphoresis, or dizziness.  All systems are reviewed  and found to be negative.   PAST MEDICAL HISTORY:  1. Gout.  2. Recent pancreatitis.  3. Hypertension x20 years.  4. Diabetes.   PAST SURGICAL HISTORY:  Cholecystectomy, abdominal surgery for resection  of obstructive bowel.   SOCIAL HISTORY:  The patient is retired.  He has been married since age  of 27.  They have 2 children.  They have grandchildren and great  grandchildren.  He does not smoke and rarely uses alcohol.   FAMILY HISTORY:  Father had myocardial infarction at age 81, dying of  cardiac complications at 46.   CURRENT LABORATORY DATA:  These are dated August 12, 2008.  Sodium  137, potassium 4.4, chloride 103, CO2 of 25, BUN 22, creatinine 1.8, and  glucose 319.  Hemoglobin 13.8, hematocrit 41.4, white blood cells 5.3,  and platelets 192.  EKG revealing normal sinus rhythm with left bundle-  branch block.  Echocardiogram  dated July 12, 2007, revealing overall  LV function appears mildly depressed with hypokinesis as described  above.   CURRENT MEDICATIONS:  1. Delatestryl 200 mg intramuscularly once a month.  2. Glipizide 5 mg 1 by mouth daily.  3. Avandamet 4/500 mg tabs, take 1 tablet by mouth twice a day.  4. Glucosamine HCl 1000 mg 2 by mouth daily.  5. Levitra 10 mg at bedtime.  6. Colchicine 0.6 mg 1 tablet p.o. b.i.d.  7. Zebrax 5% ointment as directed.  8. Cheratussin AC 100/10, 5 mL syrup 1-2 teaspoons by mouth at bedtime      p.r.n.  9. Darvocet-N 100, 100/650, one to two tablets q.4 h p.r.n. pain.  10.Bayer Aspirin p.r.n.  11.Omeprazole DR 40 mg capsule once a day at breakfast.   ALLERGIES:  No known drug allergies.   PHYSICAL EXAMINATION:  VITAL SIGNS:  Blood pressure 133/92, pulse  99,  respirations 24, O2 saturation 96% on room air, and temperature 97.3.  HEENT:  Head is normocephalic and atraumatic.  Eyes, PERRLA.  Mucous  membranes, mouth pink and moist.  Tongue is midline.  NECK:  Supple.  There is no JVD or carotid bruits appreciated.  CARDIOVASCULAR:  Regular rate and rhythm without murmurs, rubs, or  gallops.  LUNGS:  Clear to auscultation.  ABDOMEN:  Soft and nontender, 2+ bowel sounds.  EXTREMITIES:  Femoral arteries are palpable with no bruits auscultated.  Dorsalis pedis pulses are 1+ bilaterally.  SKIN:  Warm and dry.  NEURO:  Intact.   IMPRESSION:  1. Chest pain with abnormal stress Myoview revealing marked global      dysfunction with small fixed perfusion defect in the distal      anterior wall.  2. History of hypertension.  3. Diabetes.  4. Elevated creatinine.  5. Recent history of pancreatitis.   PLAN:  This is an 77 year old man admitted for cardiac catheterization  secondary to abnormal stress Myoview and echocardiogram for hydration  secondary to elevated creatinine.  The patient has been advised risks  and benefits and thorough explanation of cardiac catheterization has  been discussed with the patient and his wife who verbalizes  understanding, questions are answered and he is willing to proceed.  The  patient will be scheduled for cardiac catheterization with more  recommendations based upon findings and need for intervention versus  medical treatment at the discretion of Dr. Antoine Poche.      [name] Houston. Darrell Bishop, NP      [name], MD, [facility]  Electronically Signed    KML/MEDQ  D:  08/16/2008  T:  08/17/2008  Job:  [id number]   cc:   Arta Silence, MD

## 2010-10-30 NOTE — Discharge Summary (Signed)
NAMESHUBH, CHIARA NO.:  [id number]   MEDICAL RECORD NO.:  [id number]          PATIENT TYPE:  INP   LOCATION:  2007                         FACILITY:  [facility]   PHYSICIAN:  Raenette Rover. Felicity Coyer, MDDATE OF BIRTH:  07/15/1925   DATE OF ADMISSION:  11/17/2008  DATE OF DISCHARGE:  11/22/2008                               DISCHARGE SUMMARY   DISCHARGE DIAGNOSES:  1. Partial small bowel obstruction resolved with conservative      management.  2. Left renal mass 10 cm outpatient followup with Dr. Laverle Patter, Urology      next week for evaluation of suspected renal cell carcinoma.  3. Gouty flare of left ankle this hospitalization, status post      intravenous steroids and improved.  4. Type 2 diabetes exacerbated by steroid use.  Continue home Lantus.  5. Hypokalemia due to nasogastric suction status post replacement and      resolved.  6. Acute on chronic systolic heart failure this hospitalization due to      intravenous fluid volume overload status post intravenous Lasix x1      with improvement.  7. Dyslipidemia history.  8. History of gastroesophageal reflux disease.  9. Testosterone deficiency continue replacement.  10.Chronic renal insufficiency/kidney disease stage III, baseline      creatinine 1.4.   DISCHARGE MEDICATIONS:  As prior to admission and include:  1. Testosterone injections 200 mg per mL once monthly.  2. Omeprazole 40 mg daily.  3. Aspirin 325 mg daily.  4. Coreg 6.25 mg twice daily.  5. Lantus 10 units subcu daily.  6. Newly resumed his lisinopril 5 mg daily per direction of Dr.      Antoine Poche.  The patient was previously on this medication in      January, but has been on hold until this time.   HOSPITAL FOLLOWUP:  1. With Cardiologist Monday, November 28, 2008.  The patient and family to      call to confirm time for this day per Dr. Jenene Slicker instruction.  2. With urologist Dr. Laverle Patter on Wednesday November 30, 2008 as previously       instructed.  3. With primary care physician Dr. Hetty Ely to be arranged in the next      2-3 weeks for routine followup.   CONSULTS THIS HOSPITALIZATION:  Neurology Dr. Sherron Monday, Surgery Dr.  Luretha Murphy, Cardiology Dr. Antoine Poche.   CONDITION ON DISCHARGE:  Medically improved and stable.   HOSPITAL COURSE:  1. Small bowel obstruction.  The patient is a pleasant 77 year old      gentleman with multiple medical problems who came to the emergency      room complaining of abdominal pain with vomiting.  He had a history      of similar symptoms 2 months prior with prior diagnosis of small-      bowel obstruction which resolved with conservative care.  ER      evaluation confirmed recurrent partial small bowel obstruction and      he was admitted for surgical evaluation.  An NG tube was replaced      and the patient was  made n.p.o. with bowel rest with conservative      management.  The patient once again resolved.  His small-bowel      obstruction began having flatus and his diet was advanced without      complication.  There was consideration of dietary indiscretion      contributing to the patient's recurrent symptoms specifically      popcorn consumption per Dr. Luretha Murphy though was also noted      that if the patient requires Urology intervention for his right      renal complex cyst that dual surgery to evaluate for adhesions may      also be possible.  However, at this time the patient has tolerated      a regular diet.  He is having bowel movements flatus without pain,      nausea or vomiting and is felt stable for discharge home.      Outpatient followup with Cardiology, Urology and primary care      physician as described.  2. Other medical issues.  The patient's other chronic medical issues      are as listed above.  He will resume his home medications as      ongoing prior to admission with the addition of resumed lisinopril      per the direction of Cardiology for his  underlying systolic heart      failure.   Over 30 minutes spent on day of discharge coordination for review in  discharge planning.      Valerie A. Felicity Coyer, MD  Electronically Signed     VAL/MEDQ  D:  11/22/2008  T:  11/23/2008  Job:  [id number]

## 2010-10-30 NOTE — H&P (Signed)
[name], [name]            ACCOUNT NO.:  [id number]   MEDICAL RECORD NO.:  [id number]          PATIENT TYPE:  INP   LOCATION:  4741                         FACILITY[facility]  [facility]   PHYSICIAN:  Michiel Cowboy, MDDATE OF BIRTH:  05-Feb-1926   DATE OF ADMISSION:  10/21/2008  DATE OF DISCHARGE:                              HISTORY & PHYSICAL   PRIMARY CARE Erwin Nishiyama:  Dr. Sherryll Burger with [facility].   CHIEF COMPLAINT:  Nausea, vomiting and abdomen pain.   The patient is an 77 year old gentleman with past medical history  significant for coronary artery disease with recent CABG only 6 weeks  ago.  Also history of small bowel obstruction in the past, admission for  this in 2002, when he did not require any surgery.  This resolved  spontaneously.  The patient has history of adhesions in the past.   The patient was well up until this afternoon.  He ate a good lunch at  the restaurant and started having nausea, vomiting and abdominal pain.  He did not have any chest pain or shortness of breath.  His belly became  more and more distended, which made him worry that he had a small bowel  obstruction again.  He presented to the emergency department and sure  enough his KUB showed questionable small obstruction which was confirmed  by CT scan with partial obstruction versus an ileus.  Of note, CT scan  also showed left renal cyst that the patient is aware of and was suppose  to undergo a resection in the next few months.  Otherwise, the patient  had an NG tube placed which made him feel better.  His pain has  improved, as well as his nausea and vomiting.  Surgery was consulted,  but will see him in the morning.  Otherwise, the patient appears to be  nontoxic.   REVIEW OF SYSTEMS:  Negative except for as in HPI.   PAST MEDICAL HISTORY:  1. Significant for severe three-vessel coronary artery disease, status      post recent CABG.  2. History of severe left ventricular dysfunction.  3. Type 2  diabetes.  4. Hypertension.  5. Hypercholesterolemia.  6. History of gout.  7. History of pancreatitis.  8. History of small bowel obstruction in the past, status post      laparotomy also in the 90s, for the same reason and the second bout      in 2002, resolved with medical management.  9. Degenerative joint disease.  10.Chronic renal insufficiency, baseline creatinine around 1.5 or so.  11.Status post cholecystectomy.   SOCIAL HISTORY:  The patient does not smoke, does not drink, does not  abuse drugs.  Lives at home with his wife, very supportive family.   FAMILY HISTORY:  Noncontributory.   ALLERGIES:  NO KNOWN DRUG ALLERGIES.   MEDICATIONS:  1. Tylenol 500 mg daily as needed.  2. Aspirin 325 mg as needed.  3. Carvedilol 6.25 mg twice a day.  4. Colchicine as needed.  5. Glucosamine 1500 mg daily.  6. Lantus 10 units at bedtime.  7. Nitroglycerin as needed.  8. Omeprazole  40 mg daily.   PHYSICAL EXAMINATION:  VITAL SIGNS:  Temperature 97.6, blood pressure  174/99, pulse 81, respirations 16.  Saturating 99% on room air.  GENERAL:  The patient appears to be in no acute distress.  HEENT:  Head nontraumatic.  Somewhat dryish mucous membranes.  LUNGS:  Clear to auscultation bilaterally.  Some occasional crackles  noted and overall somewhat diminished air movement, but no wheezes.  HEART:  Regular rate and rhythm.  No murmurs could be appreciated.  ABDOMEN:  Distended, slightly tympanic around the left upper quadrant.  Gurgling high-pitched noises noted, nontender.  NG tube in place.  There  is about 500 mL of brownish liquid in the NG tube suction container.  LOWER EXTREMITIES:  Without clubbing, cyanosis or edema.  SKIN:  Healing scars over chest and healed scars over abdomen.  The  patient is status post cholecystectomy.  NEUROLOGICAL:  The patient appears to be intact.   LABORATORY DATA:  White blood cell count 8.7, hemoglobin 13.6, sodium  135, potassium 4.1,  creatinine 1.44, which is about his baseline.  LFTs  within normal limits.  UA negative.  KUB showing small bowel  obstruction.  CT scan showing partial obstruction versus ileus with left  renal cyst around 10 cm.  Chest x-ray showing no cardiopulmonary  disease.  The patient is status post CABG.   ASSESSMENT/PLAN:  This is an 77 year old gentleman with history of  hypertension, coronary artery disease, remote small bowel obstruction,  now presents with another episode of small bowel obstruction.   1. Small bowel obstruction.  Appreciate surgical consult.  Hopefully,      this will resolve with medical management.  We will make n.p.o.,      continue NG tube to intermittent suction and watch.  2. History of coronary artery disease, currently appears to be stable.      Hold all his p.o. medications.  Continue metoprolol IV with holding      parameters.  3. Prophylaxis.  Protonix and sequential compression devices.  4. History of hypertension.  Only can continue metoprolol for now.  5. Diabetes.  We will decrease his dose of Lantus while n.p.o. and do      sliding scale.  6. Code status.  The patient wished to be full code.     Michiel Cowboy, MD  Electronically Signed    AVD/MEDQ  D:  10/21/2008  T:  10/21/2008  Job:  [id number]   cc:   Bimal R. Sherryll Burger, MD

## 2010-10-30 NOTE — Discharge Summary (Signed)
[name], [name]            ACCOUNT NO.:  [id number]   MEDICAL RECORD NO.:  [id number]          PATIENT TYPE:  INP   LOCATION:  2021                         FACILITY:  [facility]   PHYSICIAN:  Evelene Croon, M.D.     DATE OF BIRTH:  12-11-1925   DATE OF ADMISSION:  09/05/2008  DATE OF DISCHARGE:                               DISCHARGE SUMMARY   PRIMARY ADMITTING DIAGNOSIS:  Coronary artery disease.   ADDITIONAL/DISCHARGE DIAGNOSES:  1. Severe 3-vessel coronary artery disease.  2. Severe left ventricular dysfunction.  3. Type 2 diabetes mellitus.  4. Hypertension.  5. Hypercholesterolemia.  6. History of gout.  7. History of recent pancreatitis.  8. History of small bowel obstruction, status post laparotomy and      resection.  9. Degenerative joint disease.  10.Chronic renal insufficiency, baseline creatinine around 1.5-1.6      preoperatively.   PROCEDURES PERFORMED:  1. Coronary artery bypass grafting x4 (left internal mammary artery to      the left anterior descending, saphenous vein graft to the posterior      descending, saphenous vein graft to the obtuse marginal, saphenous      vein graft to the diagonal).  2. Endoscopic vein harvest, right leg.   HISTORY:  The patient is an 77 year old male who was recently referred  to Dr. Laneta Simmers for consideration of coronary artery bypass surgery.  [name]  had no prior history of coronary artery disease and was admitted to  [facility] in January of this year with complaints of  substernal chest pain.  [name] was ruled out at that time for a myocardial  infarction and was diagnosed with pancreatitis and treated by Dr.  Leone Payor.  His symptoms resolved and [name] had no further episodes of chest  pain.  During his workup; however, [name] went underwent an echocardiogram,  which showed poor acoustic windows suggesting that his ejection fraction  might be decreased.  [name] was referred as an outpatient to Dr. Antoine Poche  and underwent a stress  test on August 05, 2008.  This was abnormal  with marked LV dysfunction.  There is no evidence of ischemia.  There  was a small fixed perfusion defect in the distal anterior wall.  Because  of these findings, it was recommended that [name] undergo cardiac  catheterization and this was performed on August 17, 2008.  [name] was found  to have severe 3-vessel coronary artery disease, which was not felt to  be amenable to percutaneous intervention.  It was felt that the episode  of substernal chest discomfort that [name] had previously had was suspicious  for ischemia and after review of his films and evaluation of the  patient, Dr. Laneta Simmers recommended proceeding with coronary artery bypass  grafting.  [name] explained all risks, benefits, and alternatives of surgery  to the patient and [name] agreed to proceed.  Prior to surgery, Dr. Laneta Simmers  did consult with Dr. Heloise Purpura regarding a large cystic mass on the  left pole of [name].  Dr. Laverle Patter did not feel that this will  prevent surgery and can be treated as an  outpatient following surgery.  [name] also saw no evidence of metastatic disease on this MRI.   HOSPITAL COURSE:  Mr. Brager was admitted to [facility] on September 05, 2008, and underwent CABG x4 as described above.  Please see the  previously dictated operative report for complete details of surgery.  [name] tolerated the procedure well and was transferred to the SICU in  stable condition.  [name] was able to be extubated shortly after surgery.  [name] was hemodynamically stable and doing well on postop day 1.  [name] was  initially maintained on low-dose pressor agents, which were weaned and  discontinued over the course of his first 24 hours postop.  By postop  day #2, [name] was off all drips.  His chest tubes and lines were removed  and [name] was able to be transferred to the step-down unit.  Postoperatively, his chronic renal insufficiency has worsened with his  creatinine bumping up to 2.0.  During this time, his  metformin has been  held and as [name] has been volume overloaded, [name] has been aggressively  diuresed and is responding well to treatment.  Presently, his creatinine  is stable and his Lasix dose has been decreased.  [name] is now only  approximately 4 kg above his preoperative weight and has minimal lower  extremity edema on physical exam.  His p.o. intake has improved and his  blood sugars have been well-controlled with Lantus.  Since [name] is eating  well, his home medications have now been restarted with the exception of  metformin, which continues to remain on hold.  His Lantus has been  discontinued.  [name] has remained stable from a cardiac standpoint and has  been restarted on his home dose of Lopressor.  His blood pressure and  heart rate remained well controlled and [name] is maintaining normal sinus  rhythm.  Overall, [name] is progressing well.  His incisions are healing  well.   His labs on postop day #4 show hemoglobin of 10, hematocrit 30,  platelets 167, and white count 6.8.  Sodium 140, potassium 4.3, BUN 26,  and creatinine 2.01.  [name] will have repeat labs specifically BMET on the  morning of September 10, 2008.  It was felt that if [name] has continued to  remain stable, his creatinine has not worsened, and [name] is otherwise  doing well, [name] will hopefully be ready for discharge home at that time.   DISCHARGE MEDICATIONS:  1. Enteric-coated aspirin 325 mg daily.  2. Oxycodone 5 mg 1-2 q.4 h. p.r.n. for pain.  3. Lasix 40 mg daily x3 more days.  4. Potassium 20 mEq daily x3 days.  5. Lopressor 25 mg b.i.d.  6. Glipizide 5 mg daily.  7. Avandamet 4/500 b.i.d.  8. Zantac 75 mg daily p.r.n.  9. Omeprazole 40 mg daily.   DISCHARGE INSTRUCTIONS:  [name] is asked to refrain from driving, heavy  lifting, or strenuous activity.  [name] may continue ambulating daily and  using his incentive spirometer.  [name] may shower daily and clean his  incisions with soap and water.  [name] will continue low-fat, low-sodium,  and  carb-modified diet.   DISCHARGE FOLLOWUP:  [name] will need to make an appointment to see Dr.  Antoine Poche in 2 weeks.  [name] will follow up with Dr. Laneta Simmers on September 27, 2008, with an x-ray from [facility] imaging.  If [name] experiences further  problems or has questions following discharge, [name] is asked to contact  our office.  Coral Ceo, P.A.      Evelene Croon, M.D.  Electronically Signed    GC/MEDQ  D:  09/09/2008  T:  09/10/2008  Job:  [id number]   cc:   [name], MD, [facility]  Arta Silence, MD  [facility]

## 2010-10-30 NOTE — Consult Note (Signed)
NEW PATIENT CONSULTATION   [name], [name]  DOB:  [birth year]                                        August 23, 2008  CHART #:  [id number]   REASON FOR CONSULTATION:  Severe 3-vessel coronary artery disease with  severe left ventricular dysfunction.   CLINICAL HISTORY:  I was asked by Dr. Antoine Poche to evaluate the patient  for consideration of coronary artery bypass graft surgery for the above  problem.  He is an 77 year old gentleman with a 20-year history of  diabetes, but no prior history of cardiac disease who was admitted in  January 2010 with complaints of severe substernal chest pain.  Apparently, an electrocardiogram showed left bundle-branch block.  He  said that he ruled out for myocardial infarction.  He said he was  diagnosed with pancreatitis and gout and was seen in consultation by Dr.  Leone Payor.  His symptoms resolved and he has had no further chest pains.  He underwent an echocardiogram, which showed poor acoustic windows  suggesting his ejection fraction might be decreased.  There was  hypokinesis of the inferior, anterior, posterior septal, and apical  walls.  He was referred to Dr. Antoine Poche and a stress test was performed  on August 05, 2008.  This was abnormal with marked LV dysfunction.  There is a small fixed perfusion defect in the distal anterior wall.  There is no evidence of ischemia.  Given these findings, the patient  underwent cardiac catheterization on August 17, 2008, which showed severe  3-vessel coronary artery disease with ejection fraction of 25% with  anterior akinesis and global hypokinesis.  The left main had a distal  25% stenosis.  The LAD had a long proximal 25% stenosis and then a 90%  midvessel stenosis with a tandem 80% lesion.  The distal vessel was  diffusely narrowed with diabetic-type disease.  The first diagonal was a  large vessel with a long proximal 80% stenosis.  The left circumflex in  the AV groove portion  had a proximal 80% stenosis and a 50% mid stenosis  before a large marginal branch had 80% stenosis.  The right coronary  artery was a large dominant vessel with long proximal 30% stenosis.  There was 50% stenosis before the posterior descending branch and 80%  stenosis in the posterior descending branch.  During the patient's  hospitalization in January, he underwent CT scan of the abdomen, which  showed some edema in the head of the pancreas suggestive of  pancreatitis.  It also showed a large cyst on the inferior pole of the  left kidney measuring about 10 cm and showed a second cyst in the upper  pole of the left kidney, which was smaller.  He has subsequently  undergone MRI of the abdomen to further evaluate the left kidney and  this showed that the larger cyst on the inferior pole of the left kidney  is a complex cystic mass suggesting a malignancy.  He has been seen by  Dr. Heloise Purpura of Urology for this.  I have not talked with Dr.  Laverle Patter about it yet.   REVIEW OF SYSTEMS:  General:  He denies any fever or chills.  He has had  significant weight loss over the past several months.  His appetite has  been decreased.  He denies fatigue, but has not  been active over the  winter.  Eyes:  Negative.  ENT:  Negative.  Endocrine:  He has had a 20-  year history of diabetes.  He denies hypothyroidism.  Cardiovascular:  He has had some intermittent chest tightness, but is unclear how long  this has been going on.  He has also had some pains in his shoulders and  in his arms and it is difficult to tell if this is ischemic or due to  arthritis.  He has had some exertional dyspnea.  He denies PND or  orthopnea.  He denies peripheral edema.  Respiratory:  He denies any  cough or sputum production.  GI:  He has had no nausea or vomiting.  Denies melena and bright red blood per rectum.  He has had previous  bowel obstruction and laparotomy for that.  GU:  He denies dysuria and  hematuria.   Vascular:  He denies claudication and phlebitis.  He has  never had DVT.  Neurological:  He does report 3 episodes of dizziness  over the past month or so.  He has had no syncope.  He denies any  history of TIA or stroke.  He denies any focal weakness or numbness.  Musculoskeletal:  He does have arthritis and joint pains involving his  shoulders, elbows, and knees bilaterally.  Hematological:  He denies any  history of bleeding disorders or easy bleeding.  Psychiatric:  Negative.   ALLERGIES:  None.   MEDICATIONS:  Delatestryl 200 mg intramuscularly once a month, glipizide  5 mg daily, Avandamet 4/500 one b.i.d., glucosamine 1000 mg 2 by mouth  once daily, Levitra 10 mg p.r.n., Zantac 75 mg p.r.n., omeprazole 40 mg  daily, Tylenol 2-3 per day, and aspirin 80 mg per day.  He previously  had a heavy use of nonsteroidal anti-inflammatory agents.   PAST MEDICAL HISTORY:  Significant for diabetes, hypertension, and  hypercholesterolemia.  He has history of gout, history of recent  pancreatitis.  He is status post cholecystectomy in the past.  He is  status post laparotomy and small-bowel resection for small bowel  obstruction in the distant past.  He had a second episode of small bowel  obstruction managed with nasogastric tube.  He has a history of  degenerative joint disease.   SOCIAL HISTORY:  He is retired.  Has been married for over 50 years.  He  has 2 children.  His daughter is here today.  He smokes a little in the  distant past, but quit in the 1940s.  He rarely drinks alcohol.   FAMILY HISTORY:  His father had myocardial infarction at 20 and died at  59 of cardiac complications.   PHYSICAL EXAMINATION:  VITAL SIGNS:  His blood pressure is 166/91.  His  pulse is 85 and regular.  His respiratory rate is 18 and unlabored.  Oxygen saturation on room air is 96%.  GENERAL:  He is an elderly white male, in no distress.  HEENT:  Be normocephalic and atraumatic.  Pupils are equal and  reactive  to light and accommodation.  Extraocular muscles are intact.  His throat  is clear.  NECK:  Normal carotid pulses bilaterally.  There are no bruits.  There  is no adenopathy or thyromegaly.  CARDIAC:  Regular rate and rhythm with normal S1 and S2.  There is no  murmur, rub, or gallop.  LUNGS:  Clear.  ABDOMEN:  Active bowel sounds.  His abdomen is soft, obese, and  protuberant.  There  is an old right subcostal incision from  cholecystectomy and a midline laparotomy incision scar.  There are no  palpable masses or organomegaly.  EXTREMITIES:  No peripheral edema.  Pedal pulses are palpable  bilaterally.  SKIN:  Warm and dry.  NEUROLOGIC:  Be alert and oriented x3.  Motor and sensory exams are  grossly normal.   LABORATORY EXAMINATIONS:  Prior to his catheterization were significant  for a BUN of 22, a creatinine of 1.8, and glucose of 319.  Hemoglobin  was 13.8.   IMPRESSION:  The patient has severe 3-vessel coronary artery disease  with severe left ventricular dysfunction.  I suspect that he has had  silent myocardial infarction in the past with loss of significant  myocardium over time.  The episode of substernal chest pain that he had  in January is very suspicious for myocardial ischemia, although he was  diagnosed with acute pancreatitis.  Some of his symptoms in his  shoulders and arms may be related to myocardial ischemia, but is hard to  tell with his diffuse arthritis.  I agree that from a cardiac  standpoint, his best long-term prognosis is probably with coronary  artery bypass graft surgery given the severe 3-vessel coronary artery  disease with severe left ventricular dysfunction and diabetes.  He is 77  years old with chronic renal insufficiency, which will put him at high  risk for surgery, especially with low ejection fraction.  I am also  concerned about this large left renal mass, which may be a malignancy.  I will discuss this with his urologist to see  what his prognosis and  treatment options are going to be.  His CT scan and MRI showed no  evidence of local spread of disease, although I am concerned about his  decreased appetite and weight loss recently.  I would discuss this  further with Dr. Laverle Patter.  I will plan to see the patient back in the  office next week after having chance to discuss his case further with  Dr. Laverle Patter and Dr. Antoine Poche.   Evelene Croon, M.D.  Electronically Signed   BB/MEDQ  D:  08/23/2008  T:  08/24/2008  Job:  [id number]   cc:   [name], MD, [facility]  Heloise Purpura, MD  Arta Silence, MD

## 2010-10-30 NOTE — Op Note (Signed)
NAMETESHAWN, MOAN NO.:  [id number]   MEDICAL RECORD NO.:  [id number]          PATIENT TYPE:  INP   LOCATION:  0003                         [facility]AC[facility]   PHYSICIAN:  Heloise Purpura, MD      DATE OF BIRTH:  1926/03/18   DATE OF PROCEDURE:  01/09/2009  DATE OF DISCHARGE:                               OPERATIVE REPORT   PREOPERATIVE DIAGNOSIS:  Left renal mass.   POSTOPERATIVE DIAGNOSIS:  Left renal mass.   PROCEDURE:  Left partial nephrectomy (flank approach).   SURGEON:  Heloise Purpura, MD   ASSISTANT:  Dr. Edward Qualia.   ANESTHESIA:  General.   COMPLICATIONS:  None.   ESTIMATED BLOOD LOSS:  300 mL.   INTRAVENOUS FLUIDS:  2000 mL of lactated Ringer's and 500 mL of Hespan.   SPECIMENS:  Left renal mass.   INTRAOPERATIVE FINDINGS:  1. Intraoperative tumor margin was pathologically negative.  2. Cold ischemia time 38 minutes.   DISPOSITION:  Specimen to pathology.   DRAINS:  1. A 6-French Foley catheter.  2. A #15 Blake perinephric drain.   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  He underwent a metastatic  evaluation, which proved to be negative.  At the time of his initial  finding, he was also noted to have significant coronary artery disease  requiring coronary artery bypass grafting.  He did undergo this  procedure and recovered uneventfully.  He was therefore felt to be  optimized from a cardiac standpoint.  Reevaluation of his mass continued  to be concerning for a cystic renal malignancy.  After a thorough  discussion regarding management options for treatment, he elected to  proceed with the above procedure.  The potential risks, complications,  and alternative treatment options were discussed in detail and informed  consent was obtained.   DESCRIPTION OF PROCEDURE:  The patient was taken to  the operating room  and a general anesthetic was administered.  He was given preoperative  antibiotics, placed in the left flank position, and prepped and draped  in usual sterile fashion.  Next a preoperative time-out was performed.  An incision was then made over the eleventh rib from the posterior  axillary line and carried across the abdomen toward the umbilicus to the  level of the lateral rectus muscle.  This was carried down through the  subcutaneous tissues with electrocautery, thereby exposing the external  oblique fascia.  This fascia was then divided, exposing the underlying  internal oblique fascia.  Cautery was then used to dissect down directly  onto the eleventh rib which was cleared from the surrounding muscle  fibers with the [facility].  The neurovascular bundle  was identified just inferior to the eleventh rib and carefully dissected  away from the rib.  A right angle was able to be placed under the rib  and a Doyen was then used to clear off the posterior side of the rib.  Once the rib was freed as proximally  as necessary, the rib  cutter was  used to resect the rib and the edge of the rib was then filed down.  The  underlying periosteum was then sharply incised with a knife allowing  entry into the perinephric space.  Using blunt dissection, the posterior  plane was developed along the psoas muscle.  The internal oblique muscle  fibers and transversus muscle fibers were then divided along the length  of the incision and the peritoneum was bluntly pushed off of the  abdominal wall, thereby retracting it medially.  The anterior plane was  then developed and the ureter and gonadal vein were able to be  identified.  The ureter was encircled with a vessel loop.  The  peritoneum was noted to be densely adherent to the patient's large  cystic lower pole renal mass requiring careful dissection sharply.  There was noted be a small opening in the peritoneum  during this  dissection which was repaired with a running 2-0 Vicryl suture.  A self-  retaining Bookwalter retractor was placed and the superior attachments  of the kidney were then carefully taken down with a combination of blunt  and cautery dissection.  Gerota's fascia was entered intentionally  superiorly, allowing the upper pole of the kidney to be free.  During  this dissection, there was noted to be some retraction on the adrenal  gland and some mild oozing from the adrenal gland which was controlled  with the argon beam at this point.  The remainder of the upper pole of  the kidney was then freed up to the level of the renal hilum where a  single renal vein and a branching single renal artery were identified.  The renal artery was able to be isolated with right-angle dissection and  a vessel loop placed around it for later identification.  Attention then  turned back toward the inferior aspect of the kidney and dissection then  proceeded up toward the renal hilum from this direction.  Again care was  taken to preserve the ureter.  At this point, preparations were made for  renal hilar clamping.  Intravenous mannitol, 12.5 grams, was  administered and after it had been administered for approximately 10  minutes, a bulldog clamp was placed on the solitary renal artery.  A Vi-  Drape was then placed around the kidney isolating it from the  surrounding structures.  An ice slush was placed onto the kidney for  cold ischemia.  This was left in place for approximately 15 minutes.  The mass had previously been mostly identified although the patient was  noted that have densely adherent perinephric fat thereby obscuring the  plane between the perinephric fat and the renal capsule.  However, the  margins of the tumor had been identified sufficiently.  At this point,  sharp scissors dissection was used to remove the lower pole of the  kidney and the renal mass.  There were noted to be  entries into the  renal collecting system which were identified during this dissection.  Again care was taken to avoid the ureter and the mass was removed and  sent off for pathologic analysis.  Intraoperative pathologic analysis  did reveal that there appeared to be clear tumor margins.  The renal  collecting system was then closed with figure-of-eight and running 4-0  Vicryl sutures.  Any arcuate vessels which were noted to be bleeding  were then controlled with figure-of-eight 4-0 Vicryl sutures as well.  The lungs were hyperinflated  in order to increase central venous  pressure.  There was noted to be 1 additional area that appeared to  demonstrate venous oozing which was also controlled with a 4-0 Vicryl  suture.  At this point, preparations were made for closure of the renal  defect.  Two Surgicel bolsters were placed into the defect and 2-0  Vicryl capsular sutures were placed in preparation for securing these  bolsters and providing compression on the renal tissue.  Prior to tying  the sutures down, FloSeal was placed into the defect.  The sutures were  then secured with adequate compression of the lower pole.  The bulldog  clamp was then removed with a total cold ischemia time of 38 minutes.  There appeared to be excellent hemostasis at this point.  The overlying  perinephric fat on the lower pole of the kidney was then reapproximated  over the defect with a 2-0 Vicryl running suture.  Indigo carmine had  been administered and there was not noted to be any blue dye in the  operative field throughout the remainder of the procedure.  Inspection  of the adrenal gland was then performed.  Additional argon beam  coagulation was performed and Surgicel was placed over the adrenal gland  for hemostatic purposes.  The wound had been previously irrigated  copiously with sterile water.  At this point, a #15 Blake drain was  placed in the perinephric space and brought out through separate  stab  incision in the left lower quadrant and secured to the skin with a nylon  suture.  Preparations were then made for closure.  Two running #1 PDS  sutures were used to close the posterior and anterior layers of the  abdominal wall fascia.  The superficial wound was then copiously  irrigated and skin staples were used to reapproximate the skin.  A  sterile dressing was applied.  The patient appeared to tolerate the  procedure well without complications.  He was able to be extubated and  transferred to recovery unit in satisfactory condition.      Heloise Purpura, MD  Electronically Signed     LB/MEDQ  D:  01/09/2009  T:  01/09/2009  Job:  [id number]

## 2010-10-30 NOTE — Assessment & Plan Note (Signed)
[facility]                            CARDIOLOGY OFFICE NOTE   NAME:[name], [name]                   MRN:          [id number]  DATE:08/02/2008                            DOB:          01-01-1926    PRIMARY CARE PHYSICIAN:  Arta Silence, MD   REASON FOR PRESENTATION:  Evaluate the patient with recent admission for  chest pain, and an abnormal EKG and echocardiogram.   HISTORY OF PRESENT ILLNESS:  The patient is a pleasant 77 year old  gentleman without prior cardiac history.  He was told many years ago as  a young man that he might have a murmur.  However, he has never required  any cardiovascular testing until recently.  He was hospitalized in  January with chest discomfort.  I do not have the discharge summary.  However, the patient reports he was ultimately found to have  pancreatitis.  He is to see Dr. Leone Payor for this.  Of note, during his  hospitalization, he was apparently ruled out for myocardial infarction.  Cardiac enzymes were negative.  He did have a mildly elevated BNP of  126.  He did have an echocardiogram.  There were poor acoustic windows.  It was suggested that his ejection fraction might be slightly to  moderately reduced.  There was hypokinesis of the inferior, anterior,  posterior, septal, and apical walls.  There were no overt valvular  abnormalities.  No further cardiac workup was suggested during that  hospitalization.   Since going home, the patient has had some mild dyspnea.  This has been  a slowly progressive problem.  He will notice it walking briskly, but  not climbing a flight of stairs or walking at a moderate pace.  He does  not describe any resting shortness of breath, PND, or orthopnea.  He has  not been having any palpitations, presyncope, or syncope.  He denies any  chest, neck, or arm discomfort.  He does have an EKG with a left bundle-  branch block.  It is not entirely clear to me how long this has  been  ongoing.  He has multiple cardiovascular risk factors.   The patient is not overly active.  He does not exercise routinely,  though again he cannot climb a flight of stairs at church, which he does  routinely.   PAST MEDICAL HISTORY:  1. Gout.  2. Recent pancreatitis.  3. Hypertension x20 years.  4. Diabetes mellitus x20 years.   PAST SURGICAL HISTORY:  1. Cholecystectomy.  2. Abdominal surgery for resection of obstructive bowel.   ALLERGIES:  None.   MEDICATIONS:  1. Protonix 40 mg daily.  2. Testosterone.  3. Glipizide 5 mg daily.  4. Avandamet 4/500 b.i.d.  5. Zantac.   SOCIAL HISTORY:  The patient is retired.  He has been married since he  was 24 to the same woman.  They have 2 children.  They have  grandchildren and great grandchildren.  He does not smoke cigarettes.  He rarely drinks alcohol.   FAMILY HISTORY:  Contributory for his father having a myocardial  infarction at 70 and dying  of cardiac complications at age 73.   REVIEW OF SYSTEMS:  As stated in the HPI and positive for reflux, joint  pains, right shoulder discomfort, and diarrhea.  Otherwise, negative for  all other systems.   PHYSICAL EXAMINATION:  GENERAL:  The patient is in no acute distress.  VITAL SIGNS:  Blood pressure 142/86, heart rate 89 and regular, weight  229 pounds, and body mass index 30.  HEENT:  Eyelids unremarkable; pupils are equal, round, and reactive to  light; fundi within normal limits; oral mucosa unremarkable.  NECK:  No jugular venous distention at 45 degrees, carotid upstroke  brisk and symmetric, no bruits, no thyromegaly.  LYMPHATICS:  No cervical, axillary, or inguinal adenopathy.  LUNGS:  Clear to auscultation bilaterally.  BACK:  No costovertebral angle tenderness.  CHEST:  Unremarkable.  HEART:  PMI not displaced or sustained; S1 and S2 within normal limits,  no S3, no S4; no clicks, no rubs, no murmurs.  ABDOMEN:  Obese; positive bowel sounds, normal in  frequency and pitch;  no bruits, no rebound, no guarding, no midline pulsatile mass; no  hepatomegaly, no splenomegaly.  SKIN:  No rashes, no nodules.  EXTREMITIES:  Pulses 2+ throughout, no edema, no cyanosis, no clubbing.  NEURO:  Oriented to person, place, and time; cranial nerves II through  XII grossly intact; motor grossly intact.   EKG, sinus rhythm, rate 89, left bundle-branch block, no acute ST-T wave  changes, no old EKGs for comparison.   ASSESSMENT AND PLAN:  1. Chest pain.  The patient did have chest pain that was felt to be      pancreatitis.  However, during this workup he was noted to have an      abnormal echocardiogram with possible regional wall motion      abnormalities and a reduced ejection fraction.  He has multiple      cardiovascular risk factors.  He has an EKG with left bundle-branch      block.  Given all of this, the pretest probability of obstructive      coronary disease is at least moderately high.  He cannot have an      exercise treadmill test with his left bundle-branch block.      Therefore, he needs a Myoview study.  We will make this an      adenosine Myoview.  Further evaluation will be based on these      results.  2. Hypertension.  His blood pressure is controlled and he will      continue the meds as listed.  3. Diabetes mellitus.  We discussed this.  His last hemoglobin A1c was      7.8.  I told him that the goal should be less than 7.  It was there      in March 2006.  I have encouraged him to follow with Dr. Hetty Ely      for management of this.  He is due to have labs pretty soon.  4. Dyslipidemia.  I did review these.  This was in August 2009.  His      total was 223, triglycerides 220, and HDL 33.9.  His direct LDL was      151.7.  I would suggest a statin based on the results of the Heart      Protection Study.  He will be having lipid profile soon.  I would      suggest a goal LDL less than 100 and HDL greater than  40.  I will       defer to Dr. Hetty Ely unless suggested, otherwise.  5. Obesity.  We discussed the need to lose weight with diet and      exercise.  He does not exercise unless the weather is good and we      discussed strategies to overcome this.  6. Followup.  I will see him back as needed and based on the results      of the stress perfusion study.     [name], MD, [facility]  Electronically Signed    JH/MedQ  DD: 08/02/2008  DT: 08/03/2008  Job #: [id number]   cc:   Arta Silence, MD

## 2010-10-30 NOTE — Discharge Summary (Signed)
[name], [name]            ACCOUNT NO.:  [id number]   MEDICAL RECORD NO.:  [id number]          PATIENT TYPE:  INP   LOCATION:  1425                         FACILITY:  [facility]   PHYSICIAN:  Heloise Purpura, MD      DATE OF BIRTH:  Mar 23, 1926   DATE OF ADMISSION:  01/09/2009  DATE OF DISCHARGE:  01/15/2009                               DISCHARGE SUMMARY   ADMISSION DIAGNOSES:  1. Left renal mass.  2. Chronic kidney disease.  3. History of congestive heart failure and coronary artery disease.   DISCHARGE DIAGNOSES:  1. Left renal mass.  2. Chronic kidney disease.  3. History of congestive heart failure and coronary artery disease.   HISTORY AND PHYSICAL:  For full details please see admission history and  physical.  Briefly, [name] is an 77 year old gentleman who was  incidentally found to have a large left complex renal cyst concerning  for renal malignancy.  He underwent a metastatic evaluation which was  negative.  He was also noted to have some chronic kidney disease with a  baseline serum creatinine of approximately 1.7-1.8.  He did have  preexisting coronary artery disease and underwent a coronary artery  bypass grafting procedure in the spring.  Following recovery from his  bypass, he was felt to be stable from a cardiac standpoint although  continued to have a decreased ejection fraction of approximately 25%.  A  thorough discussion was had with the patient and his family regarding  management options for treatment of his renal mass and he did elect to  pursue with nephron sparing surgery.   HOSPITAL COURSE:  On January 09, 2009, the patient was taken to the  operating room and underwent a left partial nephrectomy.  He tolerated  this procedure well and without complications.  Postoperatively he was  able to be transferred to the step-down intensive care unit for close  hemodynamic monitoring.  He did remain hemodynamically stable with a  crit of 30 and a serum  creatinine of 1.9 on postoperative day 1.  He was  subsequently able to be transferred to a regular hospital room later  that day.  He gradually began ambulating over the course of the next few  days which he was able to do without significant difficulty.  Due to the  fact that he did have significant pain related to his flank incision,  this was somewhat more difficult than usual for him.  A physical therapy  consult was obtained and it was felt that the patient did not  necessarily need physical therapy upon discharge as he did become  stronger throughout his hospital course.  He did have some difficulty  with return of bowel function, although was able to begin a clear liquid  diet on postoperative day 2.  This was gradually advanced as he did  recover bowel function and by January 15, 2009, he was tolerating a  regular diet and did have a bowel movement.  His renal function was  noted to be worsening during his hospital course and his serum  creatinine did increase to 3.4 on January 13, 2009.  It was felt that this  was likely multifactorial, but particularly was related to the fact that  he did undergo a fairly extensive left partial nephrectomy in the  setting of chronic kidney disease.  His serum creatinine subsequently  improved on January 14, 2009, and upon discharge had decreased to 2.87 and  was trending downward.  He had minimal output from his perinephric drain  which was sent for a creatinine level on postoperative day 4 and was  found to be consistent with serum.  His drain was therefore removed.  By  January 15, 2009, he was tolerating a regular diet, ambulating without  difficulty and his pain was well-controlled with oral pain medication.  He was felt to be stable for discharge and was discharged home.   DISPOSITION:  Home.   DISCHARGE MEDICATIONS:  He was instructed to resume all his regular home  medications and was given a prescription to take Percocet as needed for  pain and  told to utilize Colace as a stool softener.   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  This pathology  report as well as its implications were discussed with the patient  during his hospitalization.   FOLLOWUP:  [name] will plan to follow up as scheduled in  approximately 1 week for further evaluation and removal of his staples.      Heloise Purpura, MD  Electronically Signed     LB/MEDQ  D:  01/15/2009  T:  01/15/2009  Job:  [id number]

## 2010-10-30 NOTE — H&P (Signed)
NAMEINMER, NIX NO.:  [id number]   MEDICAL RECORD NO.:  [id number]          PATIENT TYPE:  INP   LOCATION:  3736                         FACILITY:  [facility]   PHYSICIAN:  Gordy Savers, MDDATE OF BIRTH:  [birth year]   DATE OF ADMISSION:  07/10/2008  DATE OF DISCHARGE:                              HISTORY & PHYSICAL   CHIEF COMPLAINT:  Chest pain.   HISTORY OF PRESENT ILLNESS:  The patient is an 77 year old gentleman  without known coronary heart disease.  He states for the past couple of  days, he has had some fleeting mild episodes of chest and epigastric  discomfort.  At approximately 6 a.m. of the day of admission, he had the  onset of severe lower chest epigastric pain.  This was described as a  severe fullness and pushing sensation and has lasted several hours.  He  went to Sunday school earlier, but left before completion due to  worsening pain.  He stopped by the fire department where blood pressure  was noted to be quite elevated.  He denies any similar pain in the past.  Since his arrival to the ER, initial cardiac enzymes have been negative  and pain has largely resolved.  He describes only a very minimal faint  discomfort.  He did state that when the pain was maximal, it was tender  in the epigastric area.  Associated symptoms included perhaps some mild  shortness of breath, but no nausea or diaphoresis.   The patient has a history of right upper quadrant pain and remote  cholecystectomy in the 1970s.  His medical record reviewed and he does  have a history of IVS, remote history of peptic ulcer disease, and the  patient does state that he has been diagnosed with a hiatal hernia in  the past.  He does take Zantac p.r.n., but has not been on any proton  pump inhibitors.  ED evaluation included a negative cardiac enzymes, and  lipase and amylase were normal.  He states that he also had some  significant right flank discomfort associated with  his initial pain that  has resolved.  The patient is now admitted for further evaluation and  treatment of his atypical chest epigastric pain.   PAST MEDICAL HISTORY:  Pertinent for history of right upper quadrant  pain, IBS, and a history of gastroesophageal reflux disease.  He has a  history of osteoarthritis and does take Aleve on a regular basis.  He  has diverticulosis and has had a history of sigmoid polypectomy in the  past.   Additional diagnoses include hypertension and type 2 diabetes.  He has  history of excess obesity and testosterone deficiency.  He has  dyslipidemia.  As mentioned, he has a history of peptic ulcer disease in  the past.  He has also been hospitalized in the mid 80s and in 2002 for  a partial small-bowel obstruction.  There is also a history of mild  renal insufficiency.   PRESENT MEDICAL REGIMEN:  1. Lisinopril and hydrochlorothiazide 20/12.5 daily.  2. Testosterone injections 20 mg IM monthly.  3. Glipizide 5  mg daily.  4. Avandamet 4/500 one tablet b.i.d.  The patient has been off this      medication for 2 days.  5. Levitra p.r.n.  6. Colchicine 0.6 mg p.r.n.  7. Zantac 150 mg b.i.d. p.r.n.  8. Aleve 220 b.i.d. p.r.n.  9. Tylenol p.r.n.   SOCIAL HISTORY:  He is a retired, married.  Lifelong nonsmoker.   FAMILY HISTORY:  Both parents died at age 30.  Father with history of MI  and hypertension.  Mother had a history of valvular heart disease and  breast cancer.  One step brother had brain cancer.   SURGICAL HISTORY:  Remote cholecystectomy in the 1970s.  He was admitted  for a small-bowel obstruction in the mid 80s, required laparotomy.  He  was treated medically for recurrent partial small bowel obstruction in  March 2002.  A colonoscopy was performed in July 2009.   REVIEW OF SYSTEMS:  Otherwise, unremarkable except as mentioned in the  history of present illness.  He denies any fever, chills, or change in  his weight.  Denies any dyspnea on  exertion or history of exertional  chest pain.  GI:  See history of present illness.   PHYSICAL EXAMINATION:  VITAL SIGNS:  Blood pressure 164/82.  GENERAL:  Well-developed obese male, comfortable and in no acute  distress.  SKIN:  Warm and dry without rash.  HEENT:  Normal pupil responses.  Conjunctiva clear.  ENT unremarkable.  NECK:  No bruits or neck vein distention.  CHEST:  Clear.  CARDIOVASCULAR:  Normal S1 and S2.  No murmurs or gallops.  ABDOMEN:  Soft and nontender.  There is suggestion of some mild  epigastric tenderness.  A right upper quadrant cholecystectomy scar was  noted.  EXTREMITIES:  No edema.  Peripheral pulses were intact.  The left  dorsalis pedis pulse may have been slightly diminished.   IMPRESSION:  1. Atypical chest pain.  2. Epigastric pain.   ADDITIONAL DIAGNOSES:  1. Type 2 diabetes.  2. Hypertension.   DISPOSITION:  The patient will be admitted to a telemetry setting and  observed overnight.  Cardiac enzymes will be cycled and the EKG will be  reviewed in the morning.  If the patient does well clinically and his  enzymes are negative, we will consider discharge tomorrow and outpatient  Cardiolite stress test.  The patient will empirically be placed on  Protonix.  He will be maintained on his preadmission blood pressure  regimen.  In view of his atypical chest pain and accelerator readings  metoprolol will be added to his regimen.  He will be treated with  Lovenox for DVT prophylaxis.      Gordy Savers, MD  Electronically Signed     PFK/MEDQ  D:  07/10/2008  T:  07/10/2008  Job:  [id number]

## 2010-11-01 ENCOUNTER — Ambulatory Visit (INDEPENDENT_AMBULATORY_CARE_PROVIDER_SITE_OTHER): Payer: Medicare Other | Admitting: Family Medicine

## 2010-11-01 ENCOUNTER — Telehealth: Payer: Self-pay | Admitting: Family Medicine

## 2010-11-01 ENCOUNTER — Encounter: Payer: Self-pay | Admitting: Family Medicine

## 2010-11-01 VITALS — BP 126/80 | HR 72 | Temp 97.8°F | Wt 222.1 lb

## 2010-11-01 DIAGNOSIS — R109 Unspecified abdominal pain: Secondary | ICD-10-CM

## 2010-11-01 DIAGNOSIS — E875 Hyperkalemia: Secondary | ICD-10-CM

## 2010-11-01 LAB — COMPREHENSIVE METABOLIC PANEL
ALT: 19 U/L (ref 0–53)
AST: 16 U/L (ref 0–37)
Alkaline Phosphatase: 74 U/L (ref 39–117)
BUN: 39 mg/dL — ABNORMAL HIGH (ref 6–23)
Calcium: 9.2 mg/dL (ref 8.4–10.5)
Chloride: 108 mEq/L (ref 96–112)
Creatinine, Ser: 2.7 mg/dL — ABNORMAL HIGH (ref 0.4–1.5)
Potassium: 5.8 mEq/L — ABNORMAL HIGH (ref 3.5–5.1)

## 2010-11-01 LAB — CBC WITH DIFFERENTIAL/PLATELET
Basophils Absolute: 0 10*3/uL (ref 0.0–0.1)
Basophils Relative: 0.3 % (ref 0.0–3.0)
Eosinophils Absolute: 0.1 10*3/uL (ref 0.0–0.7)
Hemoglobin: 15.4 g/dL (ref 13.0–17.0)
Lymphocytes Relative: 10.3 % — ABNORMAL LOW (ref 12.0–46.0)
Lymphs Abs: 1.2 10*3/uL (ref 0.7–4.0)
MCHC: 34.1 g/dL (ref 30.0–36.0)
MCV: 90.5 fl (ref 78.0–100.0)
Monocytes Absolute: 1.1 10*3/uL — ABNORMAL HIGH (ref 0.1–1.0)
Neutro Abs: 9 10*3/uL — ABNORMAL HIGH (ref 1.4–7.7)
RBC: 4.98 Mil/uL (ref 4.22–5.81)
RDW: 15.2 % — ABNORMAL HIGH (ref 11.5–14.6)

## 2010-11-01 LAB — POCT URINALYSIS DIPSTICK
Blood, UA: NEGATIVE
Ketones, UA: NEGATIVE
Protein, UA: 300
Spec Grav, UA: 1.03
Urobilinogen, UA: 0.2
pH, UA: 6

## 2010-11-01 MED ORDER — AMOXICILLIN-POT CLAVULANATE 875-125 MG PO TABS: 1.0000 | ORAL_TABLET | Freq: Two times a day (BID) | ORAL | Status: AC

## 2010-11-01 MED ORDER — KAYEXALATE PO POWD: 15.0000 g | Freq: Once | ORAL | Status: AC

## 2010-11-01 NOTE — Assessment & Plan Note (Addendum)
Suprapubic pain worse at night, unclear source.  Currently not in pain, exam not acute abdomen. checked UA - negative for infection.   Given age and history, concerning.  Start eval with labs.  Not consistent with diverticulitis.  Passing BMs normally. Multiple abd surgeries in past.  ? Adhesion. Good control of sugars to date. If WBC elevated, consider treatment for diverticulitis.  If not improving, may obtain CT scan.

## 2010-11-01 NOTE — Telephone Encounter (Signed)
Called to discuss results of blood work - pt states actually feeling better, has been able to catch up on sleep today. K 5.8 - would like him to take kayexalate and push fluids. Return next week for recheck with PCP and recheck BMP, CBC.   Cr 2.7, seems stable for him. Wbc 11.4, mildly elevated with ANC 9.0.  May have diverticulitis.  (sent augmentin bid x 10 days to pharmacy to fill if not improving as expected).  Advised to fill abx if not improving. Can we send copy of labwork and this phone note to Dr. Allena Katz of [facility]?  Thanks.

## 2010-11-01 NOTE — Patient Instructions (Signed)
Blood work today. Urine negative for infection. For possible diverticulitis - back down on oral intake, just go to clear liquid diet for next 24 hours to see if we get any improvement in nighttime pain. Good to see you today, call us tomorrow with update on how you're feeling.

## 2010-11-01 NOTE — Progress Notes (Signed)
  Subjective:    Patient ID: [name], male    DOB: [birth year], 77 y.o.   MRN: [id number]  HPI CC: abd pain  77 yo new to me with h/o DM, HTN, HLD, h/o bowel obstruction, pancreatitis, diverticulitis, PUD, L renal CA s/p partial nephrectomy and CKD stage 3-4.  Presents with several week h/o abd pain that seems worse at night, and last night kept him awake.  Lots of gas, frequent urination, frequent flatus.  Pain characterized as suprapubic, no radiation, throbbing.  Last BM was this am good size.  Appetite ok.    Blood sugar up this AM to 187.  H/o cholecystectomy, L partial nephrectomy, bowel obstruction surgery, and CABG. H/o pancreatitis in past, h/o prostatitis in past, not like current pain.  Currently pain free, seems to come on at night.  No blood in stool, no nausea/vomiting, dysuria, urgency.  No back pain.  No fevers/chills.  No LLQ pain, no BM changes.  Medications and allergies reviewed and updated as above. PMHx reviewed for relevance  Review of Systems Per HPI    Objective:   Physical Exam  Nursing note and vitals reviewed. Constitutional: He appears well-developed and well-nourished. No distress.  HENT:  Head: Normocephalic and atraumatic.  Mouth/Throat: Oropharynx is clear and moist. No oropharyngeal exudate.  Eyes: Conjunctivae and EOM are normal. Pupils are equal, round, and reactive to light. No scleral icterus.  Neck: Normal range of motion. Neck supple.  Cardiovascular: Normal rate, regular rhythm, normal heart sounds and intact distal pulses.   No murmur heard. Pulmonary/Chest: Effort normal and breath sounds normal. No respiratory distress. He has no wheezes. He has no rales.  Abdominal: Soft. Bowel sounds are normal. He exhibits no distension and no mass. There is no hepatosplenomegaly. There is tenderness (mild suprapubic). There is no rigidity, no rebound, no guarding and no CVA tenderness.  Skin: Skin is warm and dry. No rash noted.           Assessment & Plan:

## 2010-11-02 NOTE — Discharge Summary (Signed)
[facility]  Patient:    [name], [name]                     MRN: [id number] Adm. Date:  09/08/00 Disc. Date: 09/12/00 Attending:  Justine Null, M.D. [facility] CC:         Laurita Quint, M.D.   Discharge Summary  REASON FOR ADMISSION:  Small bowel obstruction.  HISTORY OF PRESENT ILLNESS:  The patient is a 77 year old man admitted by Dr. Posey Rea on 09/08/00, with small bowel obstruction.  Please refer to his dictated history and physical for details.  HOSPITAL COURSE:  The patient was admitted and treated with NG suction.  GI was consulted and agreed with this plan.  Gastrografin enema showed sigmoid diverticulosis.  Over the patients hospitalization, his nausea resolved and bowel movements resumed.  His oral diabetic medications were held, and his glucoses remained in the 100s during his hospitalization.  By 09/12/00, the patient was alert, oriented, ambulatory, and eating his diet again.  He was thus discharged home in good condition.  The patient had a declining hemoglobin during his hospitalization, but also had a declining BUN.  It was felt that the two were related.  Thus, it was felt reasonable that outpatient recheck of CBC and any appropriate workup would be reasonable at that time.  MEDICATIONS: 1. Ceftin 250 mg b.i.d. 2. Avandia 4 mg q.d. 3. Prinzide 20/12.5 mg q.d. 4. Phenergan 12.5 mg one or two q.6h. p.r.n. nausea.  ACTIVITY:  No restriction.  DIET:  Diabetic, no added salt.  INSTRUCTIONS TO PATIENT:  Check glucose daily.  When it is 170, resume your Glucotrol XL.  FOLLOWUP:  Dr. Hetty Ely on September 25, 2000.  Followup will need recheck GI symptoms to make sure they are resolved, recheck CBC, follow up anemia as needed, follow up diabetes and consider resumption of Glucotrol XL if the patient has not already resumed it by then. DD:  09/12/00 TD:  09/12/00 Job: [id number]

## 2010-11-02 NOTE — Telephone Encounter (Signed)
Note and labs faxed as directed.

## 2010-11-04 ENCOUNTER — Encounter: Payer: Self-pay | Admitting: *Deleted

## 2010-11-07 ENCOUNTER — Encounter: Payer: Self-pay | Admitting: Family Medicine

## 2010-11-07 ENCOUNTER — Ambulatory Visit (INDEPENDENT_AMBULATORY_CARE_PROVIDER_SITE_OTHER): Payer: 59 | Admitting: Family Medicine

## 2010-11-07 DIAGNOSIS — K5732 Diverticulitis of large intestine without perforation or abscess without bleeding: Secondary | ICD-10-CM

## 2010-11-07 DIAGNOSIS — E291 Testicular hypofunction: Secondary | ICD-10-CM

## 2010-11-07 DIAGNOSIS — E875 Hyperkalemia: Secondary | ICD-10-CM

## 2010-11-07 DIAGNOSIS — R109 Unspecified abdominal pain: Secondary | ICD-10-CM

## 2010-11-07 NOTE — Assessment & Plan Note (Signed)
Presumed cause of prev abd pain and improved.  Finish abx and then fu prn.  CBC pending.

## 2010-11-07 NOTE — Patient Instructions (Signed)
Finish the antibiotics and let me know if the pain comes back.  Take care.  You can get your results through our phone system.  Follow the instructions on the blue card.  I would get another testosterone shot in 4 weeks.  Glad to see you today.

## 2010-11-07 NOTE — Assessment & Plan Note (Signed)
S/p kayexelate and due for recheck today.  See notes on labs.

## 2010-11-07 NOTE — Assessment & Plan Note (Signed)
Injected IM 250mg  of testosterone today by staff.

## 2010-11-07 NOTE — Progress Notes (Signed)
Abd pain. Last OV note reviewed.  Pain improved on augmentin.  BMs have been normal w/o blood.  No fevers, dec in abd pain.  Feeling nearly back to baseline.  Minimal and occ 'twinge' of abd pain, but this is much better.  No nausea and eating well.    Due for recheck of labs, esp K.  Labs pending.   Due for testosterone injection.  Meds, vitals, and allergies reviewed.   ROS: See HPI.  Otherwise, noncontributory.  nad ncat rrr ctab abd soft, not ttp, normal bs

## 2010-11-08 ENCOUNTER — Encounter: Payer: Self-pay | Admitting: Cardiology

## 2010-11-08 ENCOUNTER — Telehealth: Payer: Self-pay | Admitting: Family Medicine

## 2010-11-08 ENCOUNTER — Encounter: Payer: Self-pay | Admitting: Family Medicine

## 2010-11-08 DIAGNOSIS — E875 Hyperkalemia: Secondary | ICD-10-CM

## 2010-11-08 LAB — CBC WITH DIFFERENTIAL/PLATELET
Eosinophils Absolute: 0.3 10*3/uL (ref 0.0–0.7)
Eosinophils Relative: 5.1 % — ABNORMAL HIGH (ref 0.0–5.0)
HCT: 44.4 % (ref 39.0–52.0)
Lymphs Abs: 1.2 10*3/uL (ref 0.7–4.0)
MCHC: 33.9 g/dL (ref 30.0–36.0)
MCV: 90.2 fl (ref 78.0–100.0)
Monocytes Absolute: 0.7 10*3/uL (ref 0.1–1.0)
Neutrophils Relative %: 66.2 % (ref 43.0–77.0)
Platelets: 227 10*3/uL (ref 150.0–400.0)
WBC: 6.6 10*3/uL (ref 4.5–10.5)

## 2010-11-08 LAB — COMPREHENSIVE METABOLIC PANEL
AST: 37 U/L (ref 0–37)
Alkaline Phosphatase: 73 U/L (ref 39–117)
BUN: 38 mg/dL — ABNORMAL HIGH (ref 6–23)
Glucose, Bld: 160 mg/dL — ABNORMAL HIGH (ref 70–99)
Sodium: 137 mEq/L (ref 135–145)
Total Bilirubin: 0.4 mg/dL (ref 0.3–1.2)
Total Protein: 6.8 g/dL (ref 6.0–8.3)

## 2010-11-08 LAB — PSA: PSA: 0.45 ng/mL (ref 0.10–4.00)

## 2010-11-08 LAB — TESTOSTERONE: Testosterone: 188.9 ng/dL — ABNORMAL LOW (ref 350.00–890.00)

## 2010-11-08 MED ORDER — SODIUM POLYSTYRENE SULFONATE 15 GM/60ML PO SUSP: ORAL | Status: DC

## 2010-11-08 MED ORDER — TESTOSTERONE CYPIONATE 200 MG/ML IM SOLN
200.0000 mg | Freq: Once | INTRAMUSCULAR | Status: AC
  Administered 2010-11-07: 200 mg via INTRAMUSCULAR

## 2010-11-08 NOTE — Progress Notes (Signed)
Addended by: Linde Gillis on: 11/08/2010 07:42 AM   Modules accepted: Orders

## 2010-11-08 NOTE — Telephone Encounter (Signed)
I called pt.  WBC is normal.  K is elevated.  He had some high-K fruit the day of the lab draw.  He'll cut this out and will repeat kayexalate dose tomorrow.  He'll call about rechecking K on Tuesday AM here at lab visit.  He'll keep the second dose of kayexalate if we need to dose him again.  He feels well.  Okay for outpatient fu.

## 2010-11-09 ENCOUNTER — Other Ambulatory Visit: Payer: Self-pay | Admitting: Family Medicine

## 2010-11-09 DIAGNOSIS — E291 Testicular hypofunction: Secondary | ICD-10-CM

## 2010-11-13 ENCOUNTER — Other Ambulatory Visit (INDEPENDENT_AMBULATORY_CARE_PROVIDER_SITE_OTHER): Payer: 59 | Admitting: Family Medicine

## 2010-11-13 DIAGNOSIS — E875 Hyperkalemia: Secondary | ICD-10-CM

## 2010-11-19 ENCOUNTER — Ambulatory Visit (INDEPENDENT_AMBULATORY_CARE_PROVIDER_SITE_OTHER): Payer: Medicare Other | Admitting: Cardiology

## 2010-11-19 ENCOUNTER — Encounter: Payer: Self-pay | Admitting: Cardiology

## 2010-11-19 DIAGNOSIS — I10 Essential (primary) hypertension: Secondary | ICD-10-CM

## 2010-11-19 DIAGNOSIS — I5022 Chronic systolic (congestive) heart failure: Secondary | ICD-10-CM

## 2010-11-19 MED ORDER — CARVEDILOL 12.5 MG PO TABS: 12.5000 mg | ORAL_TABLET | Freq: Two times a day (BID) | ORAL | Status: DC

## 2010-11-19 NOTE — Assessment & Plan Note (Signed)
He is having his labs followed closely by Crawford Givens, MD, MD.  He tolerates the ACE although he recently had hyperkalemia.  I will not change these meds at this time.

## 2010-11-19 NOTE — Assessment & Plan Note (Signed)
His BP is being managed in the context of titrating his CHF meds.

## 2010-11-19 NOTE — Assessment & Plan Note (Signed)
I will slowly creep up on his medications. I increased his carvedilol to 12.5 mg b.i.d.

## 2010-11-19 NOTE — Assessment & Plan Note (Signed)
I cannot sanction some of his food choices and we discussed this today.  In particular I caution him on salt intake.

## 2010-11-19 NOTE — Progress Notes (Signed)
HPI The patient presents for followup of his cardiomyopathy. Since I last saw him he tolerated a slight increase in his carvedilol. He has been very leery of this as he did have presyncope/syncope with his beta blockers previously although he might have accidentally taken a higher than the prescribed dose. He is very careful with his medications now. He denies any chest pressure, neck or arm discomfort. He has had no palpitations, presyncope or syncope. He has had no weight gain or edema. He denies any shortness of breath, PND or orthopnea. He says he is actually feeling better and breathing better. We had a long discussion about diet today. He eats out and makes some poor choices.  Allergies  Allergen Reactions  . Celecoxib     REACTION: myalgia: abdomenal pain  . Nsaids     REACTION: renal disease  . Rosuvastatin     REACTION: myalgia: musle pain    Current Outpatient Prescriptions  Medication Sig Dispense Refill  . acetaminophen (TYLENOL) 325 MG tablet Take 650 mg by mouth every 6 (six) hours as needed.        [name] aspirin 81 MG tablet Take 81 mg by mouth daily.        . carvedilol (COREG) 6.25 MG tablet Take 1 and 1/2 tablet by mouth every morning and take 2 tablets by mouth every evening      . Cholecalciferol (VITAMIN D) 1000 UNITS capsule Take 2 tablets by mouth daily.      . [facility] Alcohol Swabs PADS As needed.       [name] GLUCOSAMINE CHONDROITIN COMPLX PO Take 1 capsule by mouth 2 (two) times daily.       [name] glucose blood (BAYER CONTOUR TEST) test strip Check blood sugar daily       . insulin glargine (LANTUS OPTICLIK) 100 UNIT/ML injection Inject 20 Units into the skin daily.        . Insulin Pen Needle (B-D UF III MINI PEN NEEDLES) 31G X 5 MM MISC Use as directed.       [name] lisinopril (PRINIVIL,ZESTRIL) 2.5 MG tablet Take 2.5 mg by mouth daily.        . nitroGLYCERIN (NITROSTAT) 0.4 MG SL tablet Place 0.4 mg under the tongue every 5 (five) minutes as needed.        . Nutritional Supplements  (GLUCERNA SHAKE PO) Take 4 oz by mouth daily after breakfast.        . pravastatin (PRAVACHOL) 40 MG tablet Take 40 mg by mouth daily.        . sodium bicarbonate 650 MG tablet Take 650 mg by mouth daily.        . sodium polystyrene (KAYEXALATE) 15 GM/60ML suspension 15g/67mL.  Take 15g by mouth once, take second dose if instructed by MD.  120 mL  0  . testosterone cypionate (DEPOTESTOTERONE CYPIONATE) 200 MG/ML injection Inject 250 mg into the muscle every 28 (twenty-eight) days.          Past Medical History  Diagnosis Date  . HTN (hypertension) (06/17/1981)  . Diverticula of colon (06/18/1983)  . Renal insufficiency   . Hyperlipidemia 06/17/1993)  . Diabetes mellitus  (06/17/1997)  . PUD (peptic ulcer disease) 1960's  . Pancreatitis 01/24 - 07/13/08    ABD CT  fatty liver early pancreatitis  07/12/2008  Pelvic CT Nml 07/12/2008  . Cardiomyopathy     EF 25% Ant Akines[employer] Global Hypo 3/2-08/18/08  . Thyroid nodule     per Dr. Luisa Hart  .  Renal disease     per Dr. Allena Katz  . Hypogonadism male     Past Surgical History  Procedure Date  . Colonoscopy   . Cardiac catheterization      Severe 3-vessel coronary artery disease.  Ischemic   cardiomyopathy.   . Nephrectomy 01/09/09    Clear Cell Renal Cell Carcinoma 01/09/2009  . Cardiac defibrillator placement     ICD-Medtronic .  [id number]  . Laparotomy      small mid 80's (laparotomy), 09/12/2000 (no surgery), 10/20/2008 (no surgery)  . Lesion removal     RLE 11/14/2006 (Dr Lindie Spruce)   . Ct of abdomen 06/1997    Fatty process unchanged - myelolipoma adrenal unchangd  . Ct of abdomen 02/99    Adrenal mass unchanged, no further follow up required  . Sbo 08/2000 no surgery   10/20/08 no surgery    Mid 80's laparotomy  . Esophagogastroduodenoscopy 07/12/2008    Schatzki Ring 2 cm. HH (Dr. Arlyce Dice)  . Abdominal US 07/12/08    Fatty infiltrate liver complex cyst in lower pole left kidney  . Left renal ca 2010    s/p partial nephrectomy per Dr.  Laverle Patter  . Icd/ crt (lv lead disabled)     Medtronic [id number]  . Coronary artery bypass graft 09/05/08     Mediastinotomy, extracorporeal circulation,   coronary artery bypass graft surgery x4 using a left internal mammary  artery graft to the left anterior descending coronary artery, with a  saphenous vein graft to the first diagonal branch of the LAD, a   saphenous vein graft to the obtuse marginal branch of the left   circumflex coronary artery,  Dr. Laneta Simmers  . Cholecystectomy 1970's    ROS:  As stated in the HPI and negative for all other systems.  PHYSICAL EXAM BP 148/78  Pulse 70  Resp 18  Ht 6\' 1"  (1.854 m)  Wt 225 lb (102.059 kg)  BMI 29.69 kg/m2 GENERAL:  Well appearing HEENT:  Pupils equal round and reactive, fundi not visualized, oral mucosa unremarkable NECK:  No jugular venous distention, waveform within normal limits, carotid upstroke brisk and symmetric, no bruits, no thyromegaly LYMPHATICS:  No cervical, inguinal adenopathy LUNGS:  Clear to auscultation bilaterally BACK:  No CVA tenderness CHEST:  Unremarkable, ICD pocket intact HEART:  PMI not displaced or sustained,S1 and S2 within normal limits, no S3, no S4, no clicks, no rubs, no murmurs ABD:  Flat, positive bowel sounds normal in frequency in pitch, no bruits, no rebound, no guarding, no midline pulsatile mass, no hepatomegaly, no splenomegaly EXT:  2 plus pulses throughout, no edema, no cyanosis no clubbing SKIN:  No rashes no nodules NEURO:  Cranial nerves II through XII grossly intact, motor grossly intact throughout PSYCH:  Cognitively intact, oriented to person place and time  ASSESSMENT AND PLAN

## 2010-11-19 NOTE — Patient Instructions (Signed)
Increase Carvedilol to 12.5 mg one twice a day Continue all other medications as listed Follow up in 2 months with Dr Antoine Poche

## 2010-11-20 ENCOUNTER — Other Ambulatory Visit: Payer: Self-pay | Admitting: Cardiology

## 2010-11-28 ENCOUNTER — Ambulatory Visit: Payer: 59

## 2010-12-14 ENCOUNTER — Other Ambulatory Visit: Payer: Self-pay | Admitting: Family Medicine

## 2010-12-27 ENCOUNTER — Ambulatory Visit (INDEPENDENT_AMBULATORY_CARE_PROVIDER_SITE_OTHER): Payer: 59 | Admitting: Family Medicine

## 2010-12-27 DIAGNOSIS — E291 Testicular hypofunction: Secondary | ICD-10-CM

## 2010-12-27 MED ORDER — TESTOSTERONE CYPIONATE 200 MG/ML IM SOLN
250.0000 mg | Freq: Once | INTRAMUSCULAR | Status: AC
  Administered 2010-12-27: 250 mg via INTRAMUSCULAR

## 2010-12-27 NOTE — Progress Notes (Signed)
  Subjective:    Patient ID: [name], male    DOB: 09-Sep-1925, 77 y.o.   MRN: [id number]  HPI    Review of Systems     Objective:   Physical Exam        Assessment & Plan:  rn visit.

## 2011-01-17 ENCOUNTER — Encounter: Payer: Self-pay | Admitting: Internal Medicine

## 2011-01-17 ENCOUNTER — Other Ambulatory Visit: Payer: Self-pay | Admitting: Internal Medicine

## 2011-01-17 ENCOUNTER — Ambulatory Visit (INDEPENDENT_AMBULATORY_CARE_PROVIDER_SITE_OTHER): Payer: 59 | Admitting: *Deleted

## 2011-01-17 DIAGNOSIS — I428 Other cardiomyopathies: Secondary | ICD-10-CM

## 2011-01-17 DIAGNOSIS — I509 Heart failure, unspecified: Secondary | ICD-10-CM

## 2011-01-17 DIAGNOSIS — Z95810 Presence of automatic (implantable) cardiac defibrillator: Secondary | ICD-10-CM

## 2011-01-22 ENCOUNTER — Other Ambulatory Visit: Payer: Self-pay | Admitting: Family Medicine

## 2011-01-22 LAB — REMOTE ICD DEVICE
AL AMPLITUDE: 2.6 mv
ATRIAL PACING ICD: 0.06 pct
BAMS-0001: 170 {beats}/min
CHARGE TIME: 8.698 s
FVT: 0
LV LEAD THRESHOLD: 0.625 V
PACEART VT: 0
RV LEAD AMPLITUDE: 6.4 mv
RV LEAD IMPEDENCE ICD: 418 Ohm
RV LEAD THRESHOLD: 1.25 V
TZAT-0001ATACH: 1
TZAT-0001ATACH: 3
TZAT-0002ATACH: NEGATIVE
TZAT-0002ATACH: NEGATIVE
TZAT-0004SLOWVT: 8
TZAT-0005SLOWVT: 88 pct
TZAT-0011SLOWVT: 10 ms
TZAT-0012ATACH: 150 ms
TZAT-0012FASTVT: 200 ms
TZAT-0018ATACH: NEGATIVE
TZAT-0018ATACH: NEGATIVE
TZAT-0019ATACH: 6 V
TZAT-0019FASTVT: 8 V
TZAT-0019SLOWVT: 8 V
TZAT-0020FASTVT: 1.5 ms
TZON-0003SLOWVT: 350 ms
TZON-0004VSLOWVT: 20
TZST-0001ATACH: 5
TZST-0001ATACH: 6
TZST-0001FASTVT: 2
TZST-0001FASTVT: 5
TZST-0001FASTVT: 6
TZST-0001SLOWVT: 3
TZST-0001SLOWVT: 6
TZST-0002ATACH: NEGATIVE
TZST-0002ATACH: NEGATIVE
TZST-0002FASTVT: NEGATIVE
TZST-0002FASTVT: NEGATIVE
TZST-0003SLOWVT: 20 J
TZST-0003SLOWVT: 35 J
VF: 0

## 2011-01-23 NOTE — Progress Notes (Signed)
icd remote check w/icm  

## 2011-01-29 ENCOUNTER — Ambulatory Visit (INDEPENDENT_AMBULATORY_CARE_PROVIDER_SITE_OTHER): Payer: Medicare Other | Admitting: Cardiology

## 2011-01-29 ENCOUNTER — Encounter: Payer: Self-pay | Admitting: Cardiology

## 2011-01-29 ENCOUNTER — Ambulatory Visit (HOSPITAL_COMMUNITY)
Admission: RE | Admit: 2011-01-29 | Discharge: 2011-01-29 | Disposition: A | Discharge status: Home / Self Care | Payer: Medicare Other | Source: Ambulatory Visit | Attending: Urology | Admitting: Urology

## 2011-01-29 ENCOUNTER — Other Ambulatory Visit (HOSPITAL_COMMUNITY): Payer: Self-pay | Admitting: Urology

## 2011-01-29 VITALS — BP 142/82 | HR 61 | Resp 18 | Ht 73.0 in | Wt 227.0 lb

## 2011-01-29 DIAGNOSIS — Z951 Presence of aortocoronary bypass graft: Secondary | ICD-10-CM | POA: Insufficient documentation

## 2011-01-29 DIAGNOSIS — I5022 Chronic systolic (congestive) heart failure: Secondary | ICD-10-CM

## 2011-01-29 DIAGNOSIS — I517 Cardiomegaly: Secondary | ICD-10-CM | POA: Insufficient documentation

## 2011-01-29 DIAGNOSIS — I10 Essential (primary) hypertension: Secondary | ICD-10-CM

## 2011-01-29 DIAGNOSIS — C649 Malignant neoplasm of unspecified kidney, except renal pelvis: Secondary | ICD-10-CM | POA: Insufficient documentation

## 2011-01-29 MED ORDER — CARVEDILOL 3.125 MG PO TABS: 3.1250 mg | ORAL_TABLET | Freq: Two times a day (BID) | ORAL | Status: DC

## 2011-01-29 NOTE — Assessment & Plan Note (Signed)
This is being followed closely by his primary provider, nephrology and urology.

## 2011-01-29 NOTE — Patient Instructions (Signed)
Please increase your carvedilol by 3.125 mg twice a day. Continue all other medications as listed. Follow up with Dr Antoine Poche in 6 weeks.

## 2011-01-29 NOTE — Assessment & Plan Note (Signed)
Today I will up titrate his beta blocker by 3.125 b.i.d. Carvedilol to his existing regimen. I will move slowly as he has been sensitive to that titration didn't pass.

## 2011-01-29 NOTE — Assessment & Plan Note (Signed)
This is being managed in the context of treating his cardiomyopathy. 

## 2011-01-29 NOTE — Progress Notes (Signed)
HPI The patient presents for followup of his cardiomyopathy. Since I last saw him he tolerated a slight increase in his carvedilol. He did have one episode of syncope when he was very hot at a funeral.  Otherwise he has had no presyncope palpitations or other symptoms related to up titration of his meds. He denies any shortness of breath, PND or orthopnea. He said no weight gain or edema.  Allergies  Allergen Reactions  . Celecoxib     REACTION: myalgia: abdomenal pain  . Nsaids     REACTION: renal disease  . Rosuvastatin     REACTION: myalgia: musle pain    Current Outpatient Prescriptions  Medication Sig Dispense Refill  . acetaminophen (TYLENOL) 325 MG tablet Take 650 mg by mouth every 6 (six) hours as needed.        [name] aspirin 81 MG tablet Take 81 mg by mouth daily.        . B-D UF III MINI PEN NEEDLES 31G X 5 MM MISC USE AS DIRECTED  30 each  11  . carvedilol (COREG) 12.5 MG tablet Take 1 tablet (12.5 mg total) by mouth 2 (two) times daily.  60 tablet  11  . Cholecalciferol (VITAMIN D) 1000 UNITS capsule Take 2 tablets by mouth daily.      . CVS Alcohol Swabs PADS As needed.       [name] GLUCOSAMINE CHONDROITIN COMPLX PO Take 1 capsule by mouth 2 (two) times daily.       [name] glucose blood (BAYER CONTOUR TEST) test strip Check blood sugar daily       . LANTUS SOLOSTAR 100 UNIT/ML injection INJECT 20 UNITS SUB-Q DAILY  15 Syringe  1  . lisinopril (PRINIVIL,ZESTRIL) 2.5 MG tablet Take 2.5 mg by mouth daily.        . nitroGLYCERIN (NITROSTAT) 0.4 MG SL tablet Place 0.4 mg under the tongue every 5 (five) minutes as needed.        . Nutritional Supplements (GLUCERNA SHAKE PO) Take 4 oz by mouth daily after breakfast.        . pravastatin (PRAVACHOL) 40 MG tablet TAKE 1 TABLET BY MOUTH AT BEDTIME  30 tablet  11  . sodium bicarbonate 650 MG tablet Take 650 mg by mouth daily.        . sodium polystyrene (KAYEXALATE) 15 GM/60ML suspension 15g/44mL.  Take 15g by mouth once, take second dose if  instructed by MD.  120 mL  0  . testosterone cypionate (DEPOTESTOTERONE CYPIONATE) 200 MG/ML injection Inject 250 mg into the muscle every 28 (twenty-eight) days.          Past Medical History  Diagnosis Date  . HTN (hypertension) (06/17/1981)  . Diverticula of colon (06/18/1983)  . Renal insufficiency   . Hyperlipidemia 06/17/1993)  . Diabetes mellitus  (06/17/1997)  . PUD (peptic ulcer disease) 1960's  . Pancreatitis 01/24 - 07/13/08    ABD CT  fatty liver early pancreatitis  07/12/2008  Pelvic CT Nml 07/12/2008  . Cardiomyopathy     EF 25% Ant Akinesis Global Hypo 3/2-08/18/08  . Thyroid nodule     per Dr. Luisa Hart  . Renal disease     per Dr. Allena Katz  . Hypogonadism male     Past Surgical History  Procedure Date  . Colonoscopy   . Cardiac catheterization      Severe 3-vessel coronary artery disease.  Ischemic   cardiomyopathy.   . Nephrectomy 01/09/09    Clear Cell Renal Cell Carcinoma  01/09/2009  . Cardiac defibrillator placement     ICD-Medtronic .  [id number]  . Laparotomy      small mid 80's (laparotomy), 09/12/2000 (no surgery), 10/20/2008 (no surgery)  . Lesion removal     RLE 11/14/2006 (Dr Lindie Spruce)   . Ct of abdomen 06/1997    Fatty process unchanged - myelolipoma adrenal unchangd  . Ct of abdomen 02/99    Adrenal mass unchanged, no further follow up required  . Sbo 08/2000 no surgery   10/20/08 no surgery    Mid 80's laparotomy  . Esophagogastroduodenoscopy 07/12/2008    Schatzki Ring 2 cm. HH (Dr. Arlyce Dice)  . Abdominal US 07/12/08    Fatty infiltrate liver complex cyst in lower pole left kidney  . Left renal ca 2010    s/p partial nephrectomy per Dr. Laverle Patter  . Icd/ crt (lv lead disabled)     Medtronic [id number]  . Coronary artery bypass graft 09/05/08     Mediastinotomy, extracorporeal circulation,   coronary artery bypass graft surgery x4 using a left internal mammary  artery graft to the left anterior descending coronary artery, with a  saphenous vein graft to the  first diagonal branch of the LAD, a   saphenous vein graft to the obtuse marginal branch of the left   circumflex coronary artery,  Dr. Laneta Simmers  . Cholecystectomy 1970's    ROS:  As stated in the HPI and negative for all other systems.  PHYSICAL EXAM BP 142/82  Pulse 61  Resp 18  Ht 6\' 1"  (1.854 m)  Wt 227 lb (102.967 kg)  BMI 29.95 kg/m2 GENERAL:  Well appearing HEENT:  Pupils equal round and reactive, fundi not visualized, oral mucosa unremarkable NECK:  No jugular venous distention, waveform within normal limits, carotid upstroke brisk and symmetric, no bruits, no thyromegaly LYMPHATICS:  No cervical, inguinal adenopathy LUNGS:  Clear to auscultation bilaterally BACK:  No CVA tenderness CHEST:  Unremarkable, ICD pocket intact HEART:  PMI not displaced or sustained,S1 and S2 within normal limits, no S3, no S4, no clicks, no rubs, no murmurs ABD:  Flat, positive bowel sounds normal in frequency in pitch, no bruits, no rebound, no guarding, no midline pulsatile mass, no hepatomegaly, no splenomegaly EXT:  2 plus pulses throughout, no edema, no cyanosis no clubbing SKIN:  No rashes no nodules NEURO:  Cranial nerves II through XII grossly intact, motor grossly intact throughout PSYCH:  Cognitively intact, oriented to person place and time  EKG:  Sinus rhythm with ventricular pacing, rate 61  ASSESSMENT AND PLAN

## 2011-02-01 ENCOUNTER — Other Ambulatory Visit: Payer: Self-pay | Admitting: *Deleted

## 2011-02-01 ENCOUNTER — Encounter: Payer: Self-pay | Admitting: *Deleted

## 2011-02-01 ENCOUNTER — Ambulatory Visit: Payer: Medicare Other | Admitting: *Deleted

## 2011-02-01 DIAGNOSIS — E291 Testicular hypofunction: Secondary | ICD-10-CM

## 2011-02-01 MED ORDER — TESTOSTERONE CYPIONATE 200 MG/ML IM SOLN
200.0000 mg | Freq: Once | INTRAMUSCULAR | Status: AC
  Administered 2011-02-01: 200 mg via INTRAMUSCULAR

## 2011-02-04 MED ORDER — TESTOSTERONE CYPIONATE 200 MG/ML IM SOLN: 250.0000 mg | INTRAMUSCULAR | Status: DC

## 2011-02-04 NOTE — Telephone Encounter (Signed)
Patient notified.  Rx. Left at front desk for pickup

## 2011-02-04 NOTE — Telephone Encounter (Signed)
Please give to pt.  Thanks.   

## 2011-02-06 ENCOUNTER — Encounter: Payer: Self-pay | Admitting: Cardiology

## 2011-02-10 ENCOUNTER — Encounter: Payer: Self-pay | Admitting: Family Medicine

## 2011-03-10 ENCOUNTER — Telehealth: Payer: Self-pay | Admitting: Family Medicine

## 2011-03-10 DIAGNOSIS — E291 Testicular hypofunction: Secondary | ICD-10-CM

## 2011-03-10 NOTE — Telephone Encounter (Signed)
See phone note

## 2011-03-10 NOTE — Telephone Encounter (Signed)
Please call pt about getting T level drawn in 10/12.  Thanks.  AM sample.  Orders are in.

## 2011-03-11 NOTE — Telephone Encounter (Signed)
Patient notified as instructed by telephone. Patient stated that he is due for a shot now and wants to know should he go ahead and get the shot or wait until after the T level is done?

## 2011-03-11 NOTE — Telephone Encounter (Signed)
I would go ahead and get the level done and then the have the injection done the same day (after the shot).  That would be a trough level.  Thanks.

## 2011-03-11 NOTE — Telephone Encounter (Signed)
Left message on machine to call back  

## 2011-03-12 ENCOUNTER — Ambulatory Visit (INDEPENDENT_AMBULATORY_CARE_PROVIDER_SITE_OTHER): Payer: Medicare Other | Admitting: Cardiology

## 2011-03-12 ENCOUNTER — Encounter: Payer: Self-pay | Admitting: Cardiology

## 2011-03-12 DIAGNOSIS — I5022 Chronic systolic (congestive) heart failure: Secondary | ICD-10-CM

## 2011-03-12 DIAGNOSIS — I10 Essential (primary) hypertension: Secondary | ICD-10-CM

## 2011-03-12 DIAGNOSIS — I2510 Atherosclerotic heart disease of native coronary artery without angina pectoris: Secondary | ICD-10-CM | POA: Insufficient documentation

## 2011-03-12 DIAGNOSIS — N183 Chronic kidney disease, stage 3 unspecified: Secondary | ICD-10-CM

## 2011-03-12 MED ORDER — CARVEDILOL 6.25 MG PO TABS: 6.2500 mg | ORAL_TABLET | Freq: Two times a day (BID) | ORAL | Status: DC

## 2011-03-12 NOTE — Assessment & Plan Note (Addendum)
I reviewed the labs from Dr. Royetta Crochet.  His creat was 2.52.  He is being followed closely by renal and urology.

## 2011-03-12 NOTE — Assessment & Plan Note (Signed)
The patient has no new sypmtoms.  No further cardiovascular testing is indicated.  We will continue with aggressive risk reduction and meds as listed.  

## 2011-03-12 NOTE — Progress Notes (Signed)
HPI The patient presents for followup of his cardiomyopathy. Since I last saw him he tolerated a slight increase in his carvedilol.  He had one brief episode of dizziness with this.  Otherwise he has felt well.  The patient denies any new symptoms such as chest discomfort, neck or arm discomfort. There has been no new shortness of breath, PND or orthopnea. There have been no reported palpitations or syncope.   Allergies  Allergen Reactions  . Celecoxib     REACTION: myalgia: abdomenal pain  . Nsaids     REACTION: renal disease  . Rosuvastatin     REACTION: myalgia: musle pain    Current Outpatient Prescriptions  Medication Sig Dispense Refill  . acetaminophen (TYLENOL) 325 MG tablet Take 650 mg by mouth every 6 (six) hours as needed.        [name] aspirin 81 MG tablet Take 81 mg by mouth daily.        . B-D UF III MINI PEN NEEDLES 31G X 5 MM MISC USE AS DIRECTED  30 each  11  . carvedilol (COREG) 12.5 MG tablet Take 1 tablet (12.5 mg total) by mouth 2 (two) times daily.  60 tablet  11  . carvedilol (COREG) 3.125 MG tablet Take 1 tablet (3.125 mg total) by mouth 2 (two) times daily.  60 tablet  11  . Cholecalciferol (VITAMIN D) 1000 UNITS capsule Take 2 tablets by mouth daily.      . CVS Alcohol Swabs PADS As needed.       [name] GLUCOSAMINE CHONDROITIN COMPLX PO Take 1 capsule by mouth 2 (two) times daily.       [name] glucose blood (BAYER CONTOUR TEST) test strip Check blood sugar daily       . LANTUS SOLOSTAR 100 UNIT/ML injection INJECT 20 UNITS SUB-Q DAILY  15 Syringe  1  . lisinopril (PRINIVIL,ZESTRIL) 2.5 MG tablet Take 2.5 mg by mouth daily.        . nitroGLYCERIN (NITROSTAT) 0.4 MG SL tablet Place 0.4 mg under the tongue every 5 (five) minutes as needed.        . Nutritional Supplements (GLUCERNA SHAKE PO) Take 4 oz by mouth daily after breakfast.        . pravastatin (PRAVACHOL) 40 MG tablet TAKE 1 TABLET BY MOUTH AT BEDTIME  30 tablet  11  . ranitidine (ZANTAC) 75 MG tablet Take 75 mg by  mouth as needed.        . sodium bicarbonate 650 MG tablet Take 650 mg by mouth daily.        . sodium polystyrene (KAYEXALATE) 15 GM/60ML suspension 15g/31mL.  Take 15g by mouth once, take second dose if instructed by MD.  120 mL  0  . testosterone cypionate (DEPOTESTOTERONE CYPIONATE) 200 MG/ML injection Inject 1.25 mLs (250 mg total) into the muscle every 28 (twenty-eight) days.  10 mL  0    Past Medical History  Diagnosis Date  . HTN (hypertension) (06/17/1981)  . Diverticula of colon (06/18/1983)  . Renal insufficiency   . Hyperlipidemia 06/17/1993)  . Diabetes mellitus  (06/17/1997)  . PUD (peptic ulcer disease) 1960's  . Pancreatitis 01/24 - 07/13/08    ABD CT  fatty liver early pancreatitis  07/12/2008  Pelvic CT Nml 07/12/2008  . Cardiomyopathy     EF 25% Ant Akinesis Global Hypo 3/2-08/18/08  . Thyroid nodule     per Dr. Luisa Hart  . Renal disease     per Dr. Allena Katz  .  Hypogonadism male     Past Surgical History  Procedure Date  . Colonoscopy   . Cardiac catheterization      Severe 3-vessel coronary artery disease.  Ischemic   cardiomyopathy.   . Nephrectomy 01/09/09    Clear Cell Renal Cell Carcinoma 01/09/2009  . Cardiac defibrillator placement     ICD-Medtronic .  [id number]  . Laparotomy      small mid 80's (laparotomy), 09/12/2000 (no surgery), 10/20/2008 (no surgery)  . Lesion removal     RLE 11/14/2006 (Dr Lindie Spruce)   . Ct of abdomen 06/1997    Fatty process unchanged - myelolipoma adrenal unchangd  . Ct of abdomen 02/99    Adrenal mass unchanged, no further follow up required  . Sbo 08/2000 no surgery   10/20/08 no surgery    Mid 80's laparotomy  . Esophagogastroduodenoscopy 07/12/2008    Schatzki Ring 2 cm. HH (Dr. Arlyce Dice)  . Abdominal US 07/12/08    Fatty infiltrate liver complex cyst in lower pole left kidney  . Left renal ca 2010    s/p partial nephrectomy per Dr. Laverle Patter  . Icd/ crt (lv lead disabled)     Medtronic [id number]  . Coronary artery bypass graft  09/05/08     Mediastinotomy, extracorporeal circulation,   coronary artery bypass graft surgery x4 using a left internal mammary  artery graft to the left anterior descending coronary artery, with a  saphenous vein graft to the first diagonal branch of the LAD, a   saphenous vein graft to the obtuse marginal branch of the left   circumflex coronary artery,  Dr. Laneta Simmers  . Cholecystectomy 1970's    ROS:  As stated in the HPI and negative for all other systems.  PHYSICAL EXAM BP 154/82  Pulse 68  Ht 6' 1.5" (1.867 m)  Wt 226 lb (102.513 kg)  BMI 29.41 kg/m2 GENERAL:  Well appearing HEENT:  Pupils equal round and reactive, fundi not visualized, oral mucosa unremarkable NECK:  No jugular venous distention, waveform within normal limits, carotid upstroke brisk and symmetric, no bruits, no thyromegaly LYMPHATICS:  No cervical, inguinal adenopathy LUNGS:  Clear to auscultation bilaterally BACK:  No CVA tenderness CHEST:  Unremarkable, ICD pocket intact HEART:  PMI not displaced or sustained,S1 and S2 within normal limits, no S3, no S4, no clicks, no rubs, no murmurs ABD:  Flat, positive bowel sounds normal in frequency in pitch, no bruits, no rebound, no guarding, no midline pulsatile mass, no hepatomegaly, no splenomegaly, small umbilical hernia EXT:  2 plus pulses throughout, no edema, no cyanosis no clubbing SKIN:  No rashes no nodules NEURO:  Cranial nerves II through XII grossly intact, motor grossly intact throughout PSYCH:  Cognitively intact, oriented to person place and time  EKG:  Sinus rhythm with ventricular pacing, rate 61  ASSESSMENT AND PLAN

## 2011-03-12 NOTE — Assessment & Plan Note (Signed)
This is being managed in the context of treating his CHF  

## 2011-03-12 NOTE — Telephone Encounter (Signed)
Detailed message left on answering machine for patient to call the office back and schedule appointments per Dr. Para March.

## 2011-03-12 NOTE — Patient Instructions (Addendum)
Your physician recommends that you schedule a follow-up appointment in: 6 weeks with DR [facility]  Your physician has recommended you make the following change in your medication: CARVEDILOL  18.75 MG  TWICE DAILY

## 2011-03-12 NOTE — Assessment & Plan Note (Signed)
Today I will titrate to 18.75 mg bid Coreg.

## 2011-03-13 ENCOUNTER — Telehealth: Payer: Self-pay | Admitting: *Deleted

## 2011-03-13 ENCOUNTER — Ambulatory Visit (INDEPENDENT_AMBULATORY_CARE_PROVIDER_SITE_OTHER): Payer: Medicare Other | Admitting: Family Medicine

## 2011-03-13 ENCOUNTER — Other Ambulatory Visit (INDEPENDENT_AMBULATORY_CARE_PROVIDER_SITE_OTHER): Payer: Medicare Other

## 2011-03-13 DIAGNOSIS — E291 Testicular hypofunction: Secondary | ICD-10-CM

## 2011-03-13 DIAGNOSIS — E875 Hyperkalemia: Secondary | ICD-10-CM

## 2011-03-13 DIAGNOSIS — Z125 Encounter for screening for malignant neoplasm of prostate: Secondary | ICD-10-CM

## 2011-03-13 DIAGNOSIS — E349 Endocrine disorder, unspecified: Secondary | ICD-10-CM

## 2011-03-13 LAB — COMPREHENSIVE METABOLIC PANEL
ALT: 18 U/L (ref 0–53)
AST: 19 U/L (ref 0–37)
Albumin: 3.9 g/dL (ref 3.5–5.2)
CO2: 25 mEq/L (ref 19–32)
Calcium: 9 mg/dL (ref 8.4–10.5)
Chloride: 104 mEq/L (ref 96–112)
GFR: 23.29 mL/min — ABNORMAL LOW (ref >60.00)
Potassium: 6.1 mEq/L (ref 3.5–5.1)
Total Protein: 6.7 g/dL (ref 6.0–8.3)

## 2011-03-13 MED ORDER — TESTOSTERONE CYPIONATE 200 MG/ML IM SOLN
250.0000 mg | Freq: Once | INTRAMUSCULAR | Status: AC
  Administered 2011-03-13: 250 mg via INTRAMUSCULAR

## 2011-03-13 MED ORDER — SODIUM POLYSTYRENE SULFONATE 15 GM/60ML PO SUSP: 15.0000 g | ORAL | Status: AC

## 2011-03-13 NOTE — Telephone Encounter (Signed)
Please call pt.  Have him take kayexalate today and again tomorrow and recheck K on Friday.  Orders are in and rx was sent to pharmacy.  The other labs were okay (LFTs wnl, Cr at baseline).  His testosterone is still low.  If he is feeling well (ie the fatigue is controlled), then I wouldn't inc the dose.  If he is symptomatic, then we can increase the T dose.  Let me know. Thanks.  And avoid high potassium foods.

## 2011-03-13 NOTE — Telephone Encounter (Signed)
Call from [facility] lab, critical potassium of 6.1.

## 2011-03-13 NOTE — Telephone Encounter (Signed)
Patient notified. He said he had a huge bottle of kayexalate at home from the kidney doctor,  so he would use that instead of paying for some more. I told him that was fine as long as he took 60 mL today and 60 mL tomorrow. He verbalized understanding. Lab appointment scheduled for Friday.

## 2011-03-13 NOTE — Progress Notes (Signed)
Testosterone 1.25 mL IM Right gluteal without incident.

## 2011-03-15 ENCOUNTER — Telehealth: Payer: Self-pay | Admitting: *Deleted

## 2011-03-15 ENCOUNTER — Other Ambulatory Visit (INDEPENDENT_AMBULATORY_CARE_PROVIDER_SITE_OTHER): Payer: Medicare Other

## 2011-03-15 ENCOUNTER — Other Ambulatory Visit: Payer: Self-pay | Admitting: Family Medicine

## 2011-03-15 DIAGNOSIS — E875 Hyperkalemia: Secondary | ICD-10-CM

## 2011-03-15 LAB — POTASSIUM: Potassium: 4.5 mEq/L (ref 3.5–5.1)

## 2011-03-15 NOTE — Telephone Encounter (Signed)
That may have influenced it.  I would avoid high K foods in the meantime and I'd still check it to make sure that it's staying down.  Thanks.

## 2011-03-15 NOTE — Telephone Encounter (Signed)
Pt wanted to let you know that he had a high potassium intake the morning he came in for his lab work, and he asks if that could be the reason his result was high.  He had about 290 mg's, plus a third of a banana and a bagel and cream cheese.  Please advise on what you think.

## 2011-03-15 NOTE — Telephone Encounter (Signed)
Patient advised as instructed via telephone. 

## 2011-03-22 ENCOUNTER — Other Ambulatory Visit (INDEPENDENT_AMBULATORY_CARE_PROVIDER_SITE_OTHER): Payer: Medicare Other

## 2011-03-22 DIAGNOSIS — E291 Testicular hypofunction: Secondary | ICD-10-CM

## 2011-03-22 DIAGNOSIS — E875 Hyperkalemia: Secondary | ICD-10-CM

## 2011-03-22 LAB — CBC WITH DIFFERENTIAL/PLATELET
Basophils Absolute: 0 10*3/uL (ref 0.0–0.1)
Basophils Relative: 0.5 % (ref 0.0–3.0)
Eosinophils Relative: 4 % (ref 0.0–5.0)
HCT: 43.9 % (ref 39.0–52.0)
Hemoglobin: 14.6 g/dL (ref 13.0–17.0)
Lymphocytes Relative: 15.4 % (ref 12.0–46.0)
Lymphs Abs: 1.2 10*3/uL (ref 0.7–4.0)
Monocytes Relative: 12.7 % — ABNORMAL HIGH (ref 3.0–12.0)
Neutro Abs: 5.1 10*3/uL (ref 1.4–7.7)
RBC: 4.84 Mil/uL (ref 4.22–5.81)
RDW: 14.3 % (ref 11.5–14.6)
WBC: 7.6 10*3/uL (ref 4.5–10.5)

## 2011-03-22 LAB — POTASSIUM: Potassium: 4.7 mEq/L (ref 3.5–5.1)

## 2011-03-22 NOTE — Progress Notes (Signed)
Addended by: Liane Comber C on: 03/22/2011 10:13 AM   Modules accepted: Orders

## 2011-04-13 ENCOUNTER — Other Ambulatory Visit: Payer: Self-pay | Admitting: Family Medicine

## 2011-04-18 ENCOUNTER — Ambulatory Visit (INDEPENDENT_AMBULATORY_CARE_PROVIDER_SITE_OTHER): Payer: Medicare Other | Admitting: *Deleted

## 2011-04-18 DIAGNOSIS — E291 Testicular hypofunction: Secondary | ICD-10-CM

## 2011-04-18 MED ORDER — TESTOSTERONE CYPIONATE 200 MG/ML IM SOLN
250.0000 mg | Freq: Once | INTRAMUSCULAR | Status: AC
  Administered 2011-04-18: 250 mg via INTRAMUSCULAR

## 2011-04-19 ENCOUNTER — Ambulatory Visit (INDEPENDENT_AMBULATORY_CARE_PROVIDER_SITE_OTHER): Payer: Self-pay | Admitting: Surgery

## 2011-04-25 ENCOUNTER — Ambulatory Visit (INDEPENDENT_AMBULATORY_CARE_PROVIDER_SITE_OTHER): Payer: Medicare Other | Admitting: *Deleted

## 2011-04-25 ENCOUNTER — Encounter: Payer: Self-pay | Admitting: Cardiology

## 2011-04-25 ENCOUNTER — Other Ambulatory Visit: Payer: Self-pay | Admitting: Internal Medicine

## 2011-04-25 ENCOUNTER — Ambulatory Visit (INDEPENDENT_AMBULATORY_CARE_PROVIDER_SITE_OTHER): Payer: Medicare Other | Admitting: Cardiology

## 2011-04-25 ENCOUNTER — Encounter: Payer: Self-pay | Admitting: Internal Medicine

## 2011-04-25 VITALS — BP 156/81 | HR 72 | Resp 16 | Ht 73.0 in | Wt 226.0 lb

## 2011-04-25 DIAGNOSIS — I5022 Chronic systolic (congestive) heart failure: Secondary | ICD-10-CM

## 2011-04-25 DIAGNOSIS — I428 Other cardiomyopathies: Secondary | ICD-10-CM

## 2011-04-25 DIAGNOSIS — I509 Heart failure, unspecified: Secondary | ICD-10-CM

## 2011-04-25 DIAGNOSIS — Z95810 Presence of automatic (implantable) cardiac defibrillator: Secondary | ICD-10-CM

## 2011-04-25 MED ORDER — CARVEDILOL 25 MG PO TABS: 25.0000 mg | ORAL_TABLET | Freq: Two times a day (BID) | ORAL | Status: DC

## 2011-04-25 NOTE — Progress Notes (Signed)
HPI The patient presents for followup of his cardiomyopathy. Since I last saw him he tolerated a slight increase in his carvedilol to 18.75 bid.  Since I last saw him he has done well.  The patient denies any new symptoms such as chest discomfort, neck or arm discomfort. There has been no new shortness of breath, PND or orthopnea. There have been no reported palpitations, presyncope or syncope.  He is limited by knee pain.   Allergies  Allergen Reactions  . Celecoxib     REACTION: myalgia: abdomenal pain  . Nsaids     REACTION: renal disease  . Rosuvastatin     REACTION: myalgia: musle pain    Current Outpatient Prescriptions  Medication Sig Dispense Refill  . acetaminophen (TYLENOL) 325 MG tablet Take 650 mg by mouth every 6 (six) hours as needed.        [name] aspirin 81 MG tablet Take 81 mg by mouth daily.        . B-D UF III MINI PEN NEEDLES 31G X 5 MM MISC USE AS DIRECTED  30 each  11  . carvedilol (COREG) 12.5 MG tablet Take 1 tablet (12.5 mg total) by mouth 2 (two) times daily.  60 tablet  11  . carvedilol (COREG) 6.25 MG tablet Take 1 tablet (6.25 mg total) by mouth 2 (two) times daily with a meal.  60 tablet  11  . Cholecalciferol (VITAMIN D) 1000 UNITS capsule Take 2 tablets by mouth daily.      . [facility]S Alcohol Swabs PADS As needed.       [name] GLUCOSAMINE CHONDROITIN COMPLX PO Take 1 capsule by mouth 2 (two) times daily.       [name] glucose blood (BAYER CONTOUR TEST) test strip Check blood sugar daily       . insulin glargine (LANTUS SOLOSTAR) 100 UNIT/ML injection        . lisinopril (PRINIVIL,ZESTRIL) 2.5 MG tablet Take 2.5 mg by mouth daily.        . nitroGLYCERIN (NITROSTAT) 0.4 MG SL tablet Place 0.4 mg under the tongue every 5 (five) minutes as needed.        . Nutritional Supplements (GLUCERNA SHAKE PO) Take 4 oz by mouth daily after breakfast.        . pravastatin (PRAVACHOL) 40 MG tablet TAKE 1 TABLET BY MOUTH AT BEDTIME  30 tablet  11  . ranitidine (ZANTAC) 75 MG tablet Take 75 mg  by mouth as needed.        . testosterone cypionate (DEPOTESTOTERONE CYPIONATE) 200 MG/ML injection Inject 1.25 mLs (250 mg total) into the muscle every 28 (twenty-eight) days.  10 mL  0    Past Medical History  Diagnosis Date  . HTN (hypertension) (06/17/1981)  . Diverticula of colon (06/18/1983)  . Renal insufficiency   . Hyperlipidemia 06/17/1993)  . Diabetes mellitus  (06/17/1997)  . PUD (peptic ulcer disease) 1960's  . Pancreatitis 01/24 - 07/13/08    ABD CT  fatty liver early pancreatitis  07/12/2008  Pelvic CT Nml 07/12/2008  . Cardiomyopathy     EF 25% Ant Akinesis Global Hypo 3/2-08/18/08  . Thyroid nodule     per Dr. Luisa Hart  . Renal disease     per Dr. Allena Katz  . Hypogonadism male     Past Surgical History  Procedure Date  . Colonoscopy   . Cardiac catheterization      Severe 3-vessel coronary artery disease.  Ischemic   cardiomyopathy.   . Nephrectomy 01/09/09  Clear Cell Renal Cell Carcinoma 01/09/2009  . Cardiac defibrillator placement     ICD-Medtronic .  [id number]  . Laparotomy      small mid 80's (laparotomy), 09/12/2000 (no surgery), 10/20/2008 (no surgery)  . Lesion removal     RLE 11/14/2006 (Dr Lindie Spruce)   . Ct of abdomen 06/1997    Fatty process unchanged - myelolipoma adrenal unchangd  . Ct of abdomen 02/99    Adrenal mass unchanged, no further follow up required  . Sbo 08/2000 no surgery   10/20/08 no surgery    Mid 80's laparotomy  . Esophagogastroduodenoscopy 07/12/2008    Schatzki Ring 2 cm. HH (Dr. Arlyce Dice)  . Abdominal US 07/12/08    Fatty infiltrate liver complex cyst in lower pole left kidney  . Left renal ca 2010    s/p partial nephrectomy per Dr. Laverle Patter  . Icd/ crt (lv lead disabled)     Medtronic [id number]  . Coronary artery bypass graft 09/05/08     Mediastinotomy, extracorporeal circulation,   coronary artery bypass graft surgery x4 using a left internal mammary  artery graft to the left anterior descending coronary artery, with a  saphenous  vein graft to the first diagonal branch of the LAD, a   saphenous vein graft to the obtuse marginal branch of the left   circumflex coronary artery,  Dr. Laneta Simmers  . Cholecystectomy 1970's    ROS:  As stated in the HPI and negative for all other systems.  PHYSICAL EXAM BP 156/81  Pulse 72  Resp 16  Ht 6\' 1"  (1.854 m)  Wt 226 lb (102.513 kg)  BMI 29.82 kg/m2 GENERAL:  Well appearing NECK:  No jugular venous distention, waveform within normal limits, carotid upstroke brisk and symmetric, no bruits, no thyromegaly LYMPHATICS:  No cervical, inguinal adenopathy LUNGS:  Clear to auscultation bilaterally BACK:  No CVA tenderness CHEST:  Unremarkable, ICD pocket intact HEART:  PMI not displaced or sustained,S1 and S2 within normal limits, no S3, no S4, no clicks, no rubs, no murmurs ABD:  Flat, positive bowel sounds normal in frequency in pitch, no bruits, no rebound, no guarding, no midline pulsatile mass, no hepatomegaly, no splenomegaly, small umbilical hernia EXT:  2 plus pulses throughout, no edema, no cyanosis no clubbing NEURO:  Cranial nerves II through XII grossly intact, motor grossly intact throughout PSYCH:  Cognitively intact, oriented to person place and time   ASSESSMENT AND PLAN

## 2011-04-25 NOTE — Patient Instructions (Signed)
Please increase your carvedilol to 25 mg one tablet twice a day. Continue all other medications as listed  Follow up with Dr Antoine Poche in 6 weeks.

## 2011-04-25 NOTE — Assessment & Plan Note (Signed)
The patient has no new sypmtoms.  No further cardiovascular testing is indicated.  We will continue with aggressive risk reduction and meds as listed.  

## 2011-04-25 NOTE — Assessment & Plan Note (Signed)
He is euvolemic. I will increase his Coreg to 25 mg bid.

## 2011-04-28 LAB — REMOTE ICD DEVICE
AL AMPLITUDE: 2.5 mv
AL IMPEDENCE ICD: 475 Ohm
AL THRESHOLD: 0.875 V
BATTERY VOLTAGE: 3.124 V
LV LEAD IMPEDENCE ICD: 646 Ohm
RV LEAD IMPEDENCE ICD: 418 Ohm
RV LEAD THRESHOLD: 1.75 V
TOT-0002: 0
TOT-0006: 20111025000000
TZAT-0001ATACH: 2
TZAT-0001FASTVT: 1
TZAT-0001SLOWVT: 1
TZAT-0002FASTVT: NEGATIVE
TZAT-0012ATACH: 150 ms
TZAT-0012ATACH: 150 ms
TZAT-0013SLOWVT: 3
TZAT-0018ATACH: NEGATIVE
TZAT-0018FASTVT: NEGATIVE
TZAT-0018SLOWVT: NEGATIVE
TZAT-0019SLOWVT: 8 V
TZAT-0020ATACH: 1.5 ms
TZAT-0020ATACH: 1.5 ms
TZAT-0020ATACH: 1.5 ms
TZAT-0020SLOWVT: 1.5 ms
TZON-0003ATACH: 350 ms
TZON-0003VSLOWVT: 400 ms
TZON-0004SLOWVT: 16
TZON-0005SLOWVT: 12
TZST-0001ATACH: 4
TZST-0001FASTVT: 3
TZST-0001FASTVT: 4
TZST-0001FASTVT: 5
TZST-0001SLOWVT: 2
TZST-0001SLOWVT: 4
TZST-0001SLOWVT: 5
TZST-0001SLOWVT: 6
TZST-0002FASTVT: NEGATIVE
TZST-0002FASTVT: NEGATIVE
TZST-0003SLOWVT: 30 J
TZST-0003SLOWVT: 35 J
VENTRICULAR PACING ICD: 99.97 pct
VF: 0

## 2011-04-29 ENCOUNTER — Ambulatory Visit (INDEPENDENT_AMBULATORY_CARE_PROVIDER_SITE_OTHER): Payer: Medicare Other | Admitting: Surgery

## 2011-04-29 ENCOUNTER — Encounter (INDEPENDENT_AMBULATORY_CARE_PROVIDER_SITE_OTHER): Payer: Self-pay | Admitting: Surgery

## 2011-04-29 VITALS — BP 140/88 | HR 64 | Temp 97.8°F | Resp 12 | Ht 73.0 in | Wt 226.0 lb

## 2011-04-29 DIAGNOSIS — E041 Nontoxic single thyroid nodule: Secondary | ICD-10-CM

## 2011-04-29 NOTE — Progress Notes (Signed)
Subjective:     Patient ID: [name], male   DOB: [birth year], 77 y.o.   MRN: [id number]  HPI The patient returns today in 6 month followup of multiple thyroid nodules. These are then less than 2 cm in size and has had minimal change over the last year. He has no complaints. He denies any neck pain, dysphagia, worse this or fullness in his neck.   Review of Systems  Constitutional: Positive for fatigue.  HENT: Negative.  Negative for neck pain and neck stiffness.   Eyes: Negative.   Respiratory: Positive for shortness of breath.   Cardiovascular: Negative for chest pain and palpitations.  Gastrointestinal: Negative.        Objective:   Physical Exam  Constitutional: He appears well-developed and well-nourished.  HENT:  Head: Normocephalic and atraumatic.  Eyes: EOM are normal. Pupils are equal, round, and reactive to light.  Neck: Normal range of motion. Neck supple. No tracheal deviation present. No thyromegaly present.  Cardiovascular: Normal rate and regular rhythm.   Pulmonary/Chest: Effort normal and breath sounds normal.  Lymphadenopathy:    He has no cervical adenopathy.       Assessment:     Multiple thyroid nodules stable    Plan:     Thyroid ultrasound.  Return in 6 months.  If any significant increase in size of nodules,  Consider FNA.

## 2011-04-29 NOTE — Patient Instructions (Signed)
We will schedule you for an ultrasound of the neck.  Follow up in 6 months.

## 2011-05-03 ENCOUNTER — Other Ambulatory Visit: Payer: Self-pay | Admitting: Family Medicine

## 2011-05-03 NOTE — Progress Notes (Signed)
Remote icd check with icm  

## 2011-05-10 ENCOUNTER — Encounter: Payer: Self-pay | Admitting: *Deleted

## 2011-05-13 ENCOUNTER — Ambulatory Visit
Admission: RE | Admit: 2011-05-13 | Discharge: 2011-05-13 | Disposition: A | Discharge status: Home / Self Care | Payer: Medicare Other | Source: Ambulatory Visit | Attending: Surgery | Admitting: Surgery

## 2011-05-13 DIAGNOSIS — E041 Nontoxic single thyroid nodule: Secondary | ICD-10-CM

## 2011-05-16 ENCOUNTER — Other Ambulatory Visit: Payer: Self-pay | Admitting: Family Medicine

## 2011-05-27 ENCOUNTER — Encounter: Payer: Self-pay | Admitting: Family Medicine

## 2011-05-27 ENCOUNTER — Ambulatory Visit (INDEPENDENT_AMBULATORY_CARE_PROVIDER_SITE_OTHER): Payer: Medicare Other | Admitting: Family Medicine

## 2011-05-27 VITALS — BP 122/62 | HR 71 | Temp 97.6°F | Wt 227.0 lb

## 2011-05-27 DIAGNOSIS — R531 Weakness: Secondary | ICD-10-CM

## 2011-05-27 DIAGNOSIS — B9789 Other viral agents as the cause of diseases classified elsewhere: Secondary | ICD-10-CM

## 2011-05-27 DIAGNOSIS — B349 Viral infection, unspecified: Secondary | ICD-10-CM

## 2011-05-27 DIAGNOSIS — R5381 Other malaise: Secondary | ICD-10-CM

## 2011-05-27 NOTE — Progress Notes (Signed)
duration of symptoms:started Sat PM rhinorrhea:yes congestion:yes ear pain:no sore throat:yes Cough: yes, no sputum, worse at night Myalgias: yes other concerns: no fevers. HA.  Dec in appetite.  Minimal stomach discomfort. 1 episode of diarrhea this AM after taking glucerna.    ROS: See HPI.  Otherwise negative.    Meds, vitals, and allergies reviewed.   GEN: nad, alert and oriented HEENT: mucous membranes moist, TM w/o erythema, nasal epithelium injected, OP with cobblestoning but no exudates NECK: supple w/o LA CV: rrr. PULM: ctab, no inc wob, no wheeze ABD: soft, +bs EXT: no edema, in compressing stockings.

## 2011-05-27 NOTE — Patient Instructions (Signed)
Take tylenol as needed, and gargle with warm salt water for your throat.  Take claritin/loratadine (not claritin D) every other day for the congestion and runny nose.  This should gradually improve.  Take care.  Let us know if you have other concerns.

## 2011-05-28 ENCOUNTER — Encounter: Payer: Self-pay | Admitting: Family Medicine

## 2011-05-28 DIAGNOSIS — B349 Viral infection, unspecified: Secondary | ICD-10-CM | POA: Insufficient documentation

## 2011-05-28 NOTE — Assessment & Plan Note (Signed)
Likely benign viral process, doesn't appear to be flu.  The plan was for outpatient f/u.  He was leaving the clinic and didn't feel well- we rechecked him- lungs still clear, BP similar- 120/70- and glucose was not low (he had eaten poundcake before the OV).  He drank some water, felt better, and then proceeded out the clinic.  He's still okay for outpatient f/u.

## 2011-06-05 ENCOUNTER — Telehealth: Payer: Self-pay | Admitting: Cardiology

## 2011-06-05 NOTE — Telephone Encounter (Signed)
New msg Pt wants to talk to you before appt tomorrow

## 2011-06-05 NOTE — Telephone Encounter (Signed)
I spoke with the patient. He states that he has the "crud." He has been dealing with chest congestion, a head cold and diarrhea for a week. He does not have a fever. He is scheduled to see Dr. Antoine Poche tomorrow at 9:30am and wanted to know we would advise that he reschedule. He still does not feel well. I advised that with the ongoing diarrhea that it would be best to reschedule him, for the safety of others, if he is doing ok from a cardiac standpoint. He states that cardiac wise he feels ok. I have transferred him to scheduling so he may be rescheduled.

## 2011-06-06 ENCOUNTER — Ambulatory Visit: Payer: Medicare Other | Admitting: Cardiology

## 2011-06-07 ENCOUNTER — Ambulatory Visit (INDEPENDENT_AMBULATORY_CARE_PROVIDER_SITE_OTHER): Payer: Medicare Other | Admitting: *Deleted

## 2011-06-07 DIAGNOSIS — E291 Testicular hypofunction: Secondary | ICD-10-CM

## 2011-06-07 MED ORDER — TESTOSTERONE CYPIONATE 200 MG/ML IM SOLN
250.0000 mg | Freq: Once | INTRAMUSCULAR | Status: AC
  Administered 2011-06-07: 250 mg via INTRAMUSCULAR

## 2011-07-01 ENCOUNTER — Encounter: Payer: Self-pay | Admitting: Cardiology

## 2011-07-01 ENCOUNTER — Ambulatory Visit (INDEPENDENT_AMBULATORY_CARE_PROVIDER_SITE_OTHER): Payer: Medicare Other | Admitting: Cardiology

## 2011-07-01 DIAGNOSIS — I5022 Chronic systolic (congestive) heart failure: Secondary | ICD-10-CM

## 2011-07-01 DIAGNOSIS — I2510 Atherosclerotic heart disease of native coronary artery without angina pectoris: Secondary | ICD-10-CM

## 2011-07-01 MED ORDER — NITROGLYCERIN 0.4 MG SL SUBL: 0.4000 mg | SUBLINGUAL_TABLET | SUBLINGUAL | Status: DC | PRN

## 2011-07-01 NOTE — Patient Instructions (Signed)
Your physician wants you to follow-up in: 4 months. You will receive a reminder letter in the mail two months in advance. If you don't receive a letter, please call our office to schedule the follow-up appointment.  

## 2011-07-01 NOTE — Assessment & Plan Note (Signed)
The patient has no new sypmtoms.  No further cardiovascular testing is indicated.  We will continue with aggressive risk reduction and meds as listed.  

## 2011-07-01 NOTE — Assessment & Plan Note (Signed)
At this point I will continue the medications as listed. I am not convinced that his fatigue is related to the carvedilol. I would like him to recover from his recent respiratory complaints to see if he slowly improves from a fatigue standpoint. We discussed an increased activity regimen.

## 2011-07-01 NOTE — Progress Notes (Signed)
HPI The patient presents for followup of his cardiomyopathy. Since I last saw him he had "crud".  He describes sinus congestion and URI symptoms. He says he's been fatigued with decreased exercise tolerance since that time for the last 4 weeks. He doesn't ascribe this to the increase in beta blocker. He's not had any presyncope or syncope such as he had previously. He's not had any chest pressure, neck or arm discomfort. He's not having any new shortness of breath, PND or orthopnea. He has had no weight gain or edema. He does describe some erectile dysfunction.  Allergies  Allergen Reactions  . Celecoxib     REACTION: myalgia: abdomenal pain  . Nsaids     REACTION: renal disease  . Rosuvastatin     REACTION: myalgia: musle pain    Current Outpatient Prescriptions  Medication Sig Dispense Refill  . acetaminophen (TYLENOL) 325 MG tablet Take 650 mg by mouth every 6 (six) hours as needed.        [name] aspirin 81 MG tablet Take 81 mg by mouth daily.        . B-D UF III MINI PEN NEEDLES 31G X 5 MM MISC USE AS DIRECTED  30 each  11  . carvedilol (COREG) 25 MG tablet Take 1 tablet (25 mg total) by mouth 2 (two) times daily.  180 tablet  3  . Cholecalciferol (VITAMIN D) 1000 UNITS capsule Take 2 tablets by mouth daily.      . CVS Alcohol Swabs PADS USE AS NEEDED  3 each  3  . GLUCOSAMINE CHONDROITIN COMPLX PO Take 1 capsule by mouth 2 (two) times daily.       [name] glucose blood (BAYER CONTOUR TEST) test strip Check blood sugar daily       . insulin glargine (LANTUS SOLOSTAR) 100 UNIT/ML injection Inject 25 Units into the skin at bedtime.       [name] lisinopril (PRINIVIL,ZESTRIL) 2.5 MG tablet Take 2.5 mg by mouth daily.        . nitroGLYCERIN (NITROSTAT) 0.4 MG SL tablet Place 0.4 mg under the tongue every 5 (five) minutes as needed.        . Nutritional Supplements (GLUCERNA SHAKE PO) Take 4 oz by mouth daily after breakfast.        . pravastatin (PRAVACHOL) 40 MG tablet TAKE 1 TABLET BY MOUTH AT  BEDTIME  30 tablet  11  . ranitidine (ZANTAC) 75 MG tablet Take 75 mg by mouth as needed.        . testosterone cypionate (DEPOTESTOTERONE CYPIONATE) 200 MG/ML injection Inject 1.25 mLs (250 mg total) into the muscle every 28 (twenty-eight) days.  10 mL  0    Past Medical History  Diagnosis Date  . HTN (hypertension) (06/17/1981)  . Diverticula of colon (06/18/1983)  . Renal insufficiency   . Hyperlipidemia 06/17/1993)  . Diabetes mellitus  (06/17/1997)  . PUD (peptic ulcer disease) 1960's  . Pancreatitis 01/24 - 07/13/08    ABD CT  fatty liver early pancreatitis  07/12/2008  Pelvic CT Nml 07/12/2008  . Cardiomyopathy     EF 25% Ant Akinesis Global Hypo 3/2-08/18/08  . Thyroid nodule     per Dr. Luisa Hart  . Renal disease     per Dr. Allena Katz  . Hypogonadism male     Past Surgical History  Procedure Date  . Colonoscopy   . Cardiac catheterization      Severe 3-vessel coronary artery disease.  Ischemic   cardiomyopathy.   [name]  Nephrectomy 01/09/09    Clear Cell Renal Cell Carcinoma 01/09/2009  . Cardiac defibrillator placement     ICD-Medtronic .  [id number]  . Laparotomy      small mid 80's (laparotomy), 09/12/2000 (no surgery), 10/20/2008 (no surgery)  . Lesion removal     RLE 11/14/2006 (Dr Lindie Spruce)   . Ct of abdomen 06/1997    Fatty process unchanged - myelolipoma adrenal unchangd  . Ct of abdomen 02/99    Adrenal mass unchanged, no further follow up required  . Sbo 08/2000 no surgery   10/20/08 no surgery    Mid 80's laparotomy  . Esophagogastroduodenoscopy 07/12/2008    Schatzki Ring 2 cm. HH (Dr. Arlyce Dice)  . Abdominal US 07/12/08    Fatty infiltrate liver complex cyst in lower pole left kidney  . Left renal ca 2010    s/p partial nephrectomy per Dr. Laverle Patter  . Icd/ crt (lv lead disabled)     Medtronic [id number]  . Coronary artery bypass graft 09/05/08     Mediastinotomy, extracorporeal circulation,   coronary artery bypass graft surgery x4 using a left internal mammary  artery  graft to the left anterior descending coronary artery, with a  saphenous vein graft to the first diagonal branch of the LAD, a   saphenous vein graft to the obtuse marginal branch of the left   circumflex coronary artery,  Dr. Laneta Simmers  . Cholecystectomy 1970's    ROS:  As stated in the HPI and negative for all other systems.  PHYSICAL EXAM BP 120/58  Pulse 67  Ht 6' 1.5" (1.867 m)  Wt 223 lb (101.152 kg)  BMI 29.02 kg/m2 GENERAL:  Well appearing NECK:  No jugular venous distention, waveform within normal limits, carotid upstroke brisk and symmetric, no bruits, no thyromegaly LYMPHATICS:  No cervical, inguinal adenopathy LUNGS:  Clear to auscultation bilaterally BACK:  No CVA tenderness CHEST:  Unremarkable, ICD pocket intact HEART:  PMI not displaced or sustained,S1 and S2 within normal limits, no S3, no S4, no clicks, no rubs, no murmurs ABD:  Flat, positive bowel sounds normal in frequency in pitch, no bruits, no rebound, no guarding, no midline pulsatile mass, no hepatomegaly, no splenomegaly, small umbilical hernia EXT:  2 plus pulses throughout, no edema, no cyanosis no clubbing NEURO:  Cranial nerves II through XII grossly intact, motor grossly intact throughout PSYCH:  Cognitively intact, oriented to person place and time  EKG:  Sinus rhythm with ventricular pacing.  Rate  67. 07/01/2011  ASSESSMENT AND PLAN

## 2011-07-10 ENCOUNTER — Ambulatory Visit (INDEPENDENT_AMBULATORY_CARE_PROVIDER_SITE_OTHER): Payer: Medicare Other | Admitting: *Deleted

## 2011-07-10 DIAGNOSIS — E291 Testicular hypofunction: Secondary | ICD-10-CM

## 2011-07-10 MED ORDER — TESTOSTERONE CYPIONATE 200 MG/ML IM SOLN
250.0000 mg | Freq: Once | INTRAMUSCULAR | Status: AC
  Administered 2011-07-10: 250 mg via INTRAMUSCULAR

## 2011-07-29 ENCOUNTER — Ambulatory Visit (INDEPENDENT_AMBULATORY_CARE_PROVIDER_SITE_OTHER): Payer: Medicare Other | Admitting: Internal Medicine

## 2011-07-29 ENCOUNTER — Encounter: Payer: Self-pay | Admitting: Internal Medicine

## 2011-07-29 DIAGNOSIS — I5022 Chronic systolic (congestive) heart failure: Secondary | ICD-10-CM

## 2011-07-29 DIAGNOSIS — I2589 Other forms of chronic ischemic heart disease: Secondary | ICD-10-CM

## 2011-07-29 DIAGNOSIS — I10 Essential (primary) hypertension: Secondary | ICD-10-CM

## 2011-07-29 DIAGNOSIS — I2510 Atherosclerotic heart disease of native coronary artery without angina pectoris: Secondary | ICD-10-CM

## 2011-07-29 DIAGNOSIS — Z95810 Presence of automatic (implantable) cardiac defibrillator: Secondary | ICD-10-CM

## 2011-07-29 LAB — ICD DEVICE OBSERVATION
ATRIAL PACING ICD: 0.14 pct
BAMS-0001: 170 {beats}/min
CHARGE TIME: 9.128 s
FVT: 0
LV LEAD THRESHOLD: 0.5 V
PACEART VT: 0
TOT-0001: 1
TZAT-0001ATACH: 2
TZAT-0001ATACH: 3
TZAT-0002ATACH: NEGATIVE
TZAT-0002ATACH: NEGATIVE
TZAT-0011SLOWVT: 10 ms
TZAT-0012ATACH: 150 ms
TZAT-0012FASTVT: 200 ms
TZAT-0012SLOWVT: 200 ms
TZAT-0018ATACH: NEGATIVE
TZAT-0018ATACH: NEGATIVE
TZAT-0019ATACH: 6 V
TZAT-0019ATACH: 6 V
TZAT-0019ATACH: 6 V
TZAT-0019SLOWVT: 8 V
TZAT-0020FASTVT: 1.5 ms
TZON-0003SLOWVT: 350 ms
TZON-0003VSLOWVT: 400 ms
TZON-0004VSLOWVT: 20
TZST-0001ATACH: 5
TZST-0001ATACH: 6
TZST-0001FASTVT: 2
TZST-0001FASTVT: 3
TZST-0001SLOWVT: 3
TZST-0001SLOWVT: 5
TZST-0002ATACH: NEGATIVE
TZST-0002FASTVT: NEGATIVE
TZST-0002FASTVT: NEGATIVE
TZST-0003SLOWVT: 20 J
TZST-0003SLOWVT: 35 J

## 2011-07-29 NOTE — Assessment & Plan Note (Signed)
His device is working normally. We'll plan to recheck in several months. 

## 2011-07-29 NOTE — Patient Instructions (Addendum)
Remote monitoring is used to monitor your Pacemaker of ICD from home. This monitoring reduces the number of office visits required to check your device to one time per year. It allows us to keep an eye on the functioning of your device to ensure it is working properly. You are scheduled for a device check from home on Oct 31, 2011. You may send your transmission at any time that day. If you have a wireless device, the transmission will be sent automatically. After your physician reviews your transmission, you will receive a postcard with your next transmission date.  Your physician wants you to follow-up in: 1 year with Dr Taylor.  You will receive a reminder letter in the mail two months in advance. If you don't receive a letter, please call our office to schedule the follow-up appointment.  Your physician recommends that you continue on your current medications as directed. Please refer to the Current Medication list given to you today.   

## 2011-07-29 NOTE — Assessment & Plan Note (Signed)
His blood pressure is fairly well controlled. He is instructed to reduce his salt intake and continue his current medical therapy.

## 2011-07-29 NOTE — Progress Notes (Signed)
HPI   [name] is a very pleasant 78 year old man with a ischemic cardiomyopathy, chronic systolic heart failure, status post biventricular ICD implantation. The patient notes some shortness of breath over the last couple of weeks. Prior to this he was bothered with an upper respiratory infection lasting almost 2 months. In discussing his dietary history, it is clear that he is eating to much salt. I carefully tried to educate him on the correlation between salt intake and shortness of breath. He has had no chest pain or syncope. No peripheral edema.  Allergies  Allergen Reactions  . Celecoxib     REACTION: myalgia: abdomenal pain  . Nsaids     REACTION: renal disease  . Rosuvastatin     REACTION: myalgia: musle pain     Current Outpatient Prescriptions  Medication Sig Dispense Refill  . acetaminophen (TYLENOL) 325 MG tablet Take 650 mg by mouth every 6 (six) hours as needed.        [name] aspirin 81 MG tablet Take 81 mg by mouth daily.        . B-D UF III MINI PEN NEEDLES 31G X 5 MM MISC USE AS DIRECTED  30 each  11  . carvedilol (COREG) 25 MG tablet Take 1 tablet (25 mg total) by mouth 2 (two) times daily.  180 tablet  3  . Cholecalciferol (VITAMIN D) 1000 UNITS capsule Take 1 tablet by mouth daily.      . CVS Alcohol Swabs PADS USE AS NEEDED  3 each  3  . GLUCOSAMINE CHONDROITIN COMPLX PO Take 1 capsule by mouth 2 (two) times daily.       [name] glucose blood (BAYER CONTOUR TEST) test strip Check blood sugar daily       . insulin glargine (LANTUS SOLOSTAR) 100 UNIT/ML injection Inject 25 Units into the skin at bedtime.       [name] lisinopril (PRINIVIL,ZESTRIL) 2.5 MG tablet Take 2.5 mg by mouth daily.        . nitroGLYCERIN (NITROSTAT) 0.4 MG SL tablet Place 1 tablet (0.4 mg total) under the tongue every 5 (five) minutes as needed.  25 tablet  6  . Nutritional Supplements (GLUCERNA SHAKE PO) Take 4 oz by mouth daily after breakfast.        . pravastatin (PRAVACHOL) 40 MG tablet TAKE 1 TABLET BY  MOUTH AT BEDTIME  30 tablet  11  . ranitidine (ZANTAC) 75 MG tablet Take 75 mg by mouth as needed.        . testosterone cypionate (DEPOTESTOTERONE CYPIONATE) 200 MG/ML injection Inject 1.25 mLs (250 mg total) into the muscle every 28 (twenty-eight) days.  10 mL  0     Past Medical History  Diagnosis Date  . HTN (hypertension) (06/17/1981)  . Diverticula of colon (06/18/1983)  . Renal insufficiency   . Hyperlipidemia 06/17/1993)  . Diabetes mellitus  (06/17/1997)  . PUD (peptic ulcer disease) 1960's  . Pancreatitis 01/24 - 07/13/08    ABD CT  fatty liver early pancreatitis  07/12/2008  Pelvic CT Nml 07/12/2008  . Cardiomyopathy     EF 25% Ant Akinesis Global Hypo 3/2-08/18/08  . Thyroid nodule     per Dr. Luisa Hart  . Renal disease     per Dr. Allena Katz  . Hypogonadism male     ROS:   All systems reviewed and negative except as noted in the HPI.   Past Surgical History  Procedure Date  . Colonoscopy   . Cardiac catheterization  Severe 3-vessel coronary artery disease.  Ischemic   cardiomyopathy.   . Nephrectomy 01/09/09    Clear Cell Renal Cell Carcinoma 01/09/2009  . Cardiac defibrillator placement     ICD-Medtronic .  [id number]  . Laparotomy      small mid 80's (laparotomy), 09/12/2000 (no surgery), 10/20/2008 (no surgery)  . Lesion removal     RLE 11/14/2006 (Dr Lindie Spruce)   . Ct of abdomen 06/1997    Fatty process unchanged - myelolipoma adrenal unchangd  . Ct of abdomen 02/99    Adrenal mass unchanged, no further follow up required  . Sbo 08/2000 no surgery   10/20/08 no surgery    Mid 80's laparotomy  . Esophagogastroduodenoscopy 07/12/2008    Schatzki Ring 2 cm. HH (Dr. Arlyce Dice)  . Abdominal US 07/12/08    Fatty infiltrate liver complex cyst in lower pole left kidney  . Left renal ca 2010    s/p partial nephrectomy per Dr. Laverle Patter  . Icd/ crt (lv lead disabled)     Medtronic [id number]  . Coronary artery bypass graft 09/05/08     Mediastinotomy, extracorporeal  circulation,   coronary artery bypass graft surgery x4 using a left internal mammary  artery graft to the left anterior descending coronary artery, with a  saphenous vein graft to the first diagonal branch of the LAD, a   saphenous vein graft to the obtuse marginal branch of the left   circumflex coronary artery,  Dr. Laneta Simmers  . Cholecystectomy 1970's     Family History  Problem Relation Age of Onset  . Angina Father   . Hypertension Father   . Heart disease Father     MI, angina  . Breast cancer Mother   . Cancer Mother     Breast  . Heart disease Mother     Valve disease  . Cancer Brother     Brain tumor     History   Social History  . Marital Status: Married    Spouse Name: N/A    Number of Children: 2  . Years of Education: N/A   Occupational History  . Retired from [employer]    Social History Main Topics  . Smoking status: Former Smoker    Quit date: 06/17/1938  . Smokeless tobacco: Not on file  . Alcohol Use: Yes     Rare  . Drug Use: No  . Sexually Active: Not on file   Other Topics Concern  . Not on file   Social History Narrative    The patient is retired.  He has been married since age     of 69.  They have 2 children.  They have grandchildren and great     grandchildren.       BP 130/60  Pulse 59  Ht 6' 1.5" (1.867 m)  Wt 104.327 kg (230 lb)  BMI 29.93 kg/m2  SpO2 98%  Physical Exam:  Well appearing elderly man, NAD HEENT: Unremarkable Neck:  No JVD, no thyromegally Lungs:  Clear with no wheezes, rales, or rhonchi. HEART:  Regular rate rhythm, no murmurs, no rubs, no clicks Abd:  soft, positive bowel sounds, no organomegally, no rebound, no guarding Ext:  2 plus pulses, no edema, no cyanosis, no clubbing Skin:  No rashes no nodules Neuro:  CN II through XII intact, motor grossly intact  DEVICE  Normal device function.  See PaceArt for details.   Assess/Plan:

## 2011-07-29 NOTE — Assessment & Plan Note (Signed)
The patient's fluid index is elevated and he is eating too much salt. He has slight increasing heart failure symptoms. I considered adding Lasix. After discussing his dietary intake, I decided that rather than starting Lasix, we have him reduce his sodium intake. If after a week or 2 his breathing has not improved, then initiation of diuretic therapy would be appropriate.

## 2011-08-08 ENCOUNTER — Other Ambulatory Visit: Payer: Self-pay

## 2011-08-08 MED ORDER — GLUCOSE BLOOD VI STRP: ORAL_STRIP | Status: DC

## 2011-08-08 NOTE — Telephone Encounter (Signed)
[facility] request Contour test strips #100 x 3.

## 2011-08-14 ENCOUNTER — Ambulatory Visit (INDEPENDENT_AMBULATORY_CARE_PROVIDER_SITE_OTHER): Payer: Medicare Other | Admitting: *Deleted

## 2011-08-14 DIAGNOSIS — E291 Testicular hypofunction: Secondary | ICD-10-CM

## 2011-08-14 MED ORDER — TESTOSTERONE CYPIONATE 200 MG/ML IM SOLN
250.0000 mg | Freq: Once | INTRAMUSCULAR | Status: AC
  Administered 2011-08-14: 250 mg via INTRAMUSCULAR

## 2011-08-17 ENCOUNTER — Other Ambulatory Visit: Payer: Self-pay | Admitting: Family Medicine

## 2011-09-05 ENCOUNTER — Ambulatory Visit (INDEPENDENT_AMBULATORY_CARE_PROVIDER_SITE_OTHER): Payer: Medicare Other | Admitting: Family Medicine

## 2011-09-05 ENCOUNTER — Encounter: Payer: Self-pay | Admitting: Family Medicine

## 2011-09-05 VITALS — BP 104/60 | HR 64 | Temp 97.3°F | Wt 229.5 lb

## 2011-09-05 DIAGNOSIS — K5732 Diverticulitis of large intestine without perforation or abscess without bleeding: Secondary | ICD-10-CM

## 2011-09-05 MED ORDER — AMOXICILLIN-POT CLAVULANATE 500-125 MG PO TABS: 1.0000 | ORAL_TABLET | Freq: Two times a day (BID) | ORAL | Status: DC

## 2011-09-05 NOTE — Progress Notes (Signed)
Lower abd pain.  H/o presumed diverticulitis.  Intermittently with AM sx, worse than PM. Would be better after BM. Worse in the last week. Constipation over the weekend. No blood in stool.  No black stools. No fevers, vomiting, change in urination.  This is similar to episode in 10/2010.  He's better today than he was a few days ago. Sunday was the worst day for pain.   We discussed salt load, he's trying to cut back.   Meds, vitals, and allergies reviewed.   ROS: See HPI.  Otherwise, noncontributory.  nad ncat Mmm rrr ctab abd soft, ttp in suprapubic area and lower abd but no rebound, normal BS

## 2011-09-05 NOTE — Patient Instructions (Signed)
Start the antibiotics today.  If progressive pain, then notify us.  Avoid high potassium/sugar foods.  Take care.

## 2011-09-06 NOTE — Assessment & Plan Note (Signed)
H/o and presumed again.  H/o CKD would prevent CT with contrast.  Start abx given the similarity to prev episodes and f/u prn.  He agrees.

## 2011-09-20 ENCOUNTER — Ambulatory Visit (INDEPENDENT_AMBULATORY_CARE_PROVIDER_SITE_OTHER): Payer: Medicare Other | Admitting: *Deleted

## 2011-09-20 ENCOUNTER — Other Ambulatory Visit: Payer: Self-pay | Admitting: *Deleted

## 2011-09-20 DIAGNOSIS — E291 Testicular hypofunction: Secondary | ICD-10-CM

## 2011-09-20 MED ORDER — AMOXICILLIN-POT CLAVULANATE 500-125 MG PO TABS: 1.0000 | ORAL_TABLET | Freq: Two times a day (BID) | ORAL | Status: AC

## 2011-09-20 MED ORDER — TESTOSTERONE CYPIONATE 200 MG/ML IM SOLN
250.0000 mg | Freq: Once | INTRAMUSCULAR | Status: AC
  Administered 2011-09-20: 250 mg via INTRAMUSCULAR

## 2011-09-20 NOTE — Telephone Encounter (Signed)
Patient advised.

## 2011-09-20 NOTE — Telephone Encounter (Signed)
Sent.  If symptoms are only mild (minimal discomfort, no fever), then I would hold the abx for now and not start.  If it feels typical and similar to prev diverticulitis flares, then I would start the abx now.  Thanks.

## 2011-09-20 NOTE — Telephone Encounter (Signed)
Patient came in today for a nurse visit, while here he wanted to know if he could get a refill on Amoxicillin 500mg .  He stated that he had a GI bug last week with vomiting and diarrhea and now he thinks it has upset the diverticulitis again.  Uses [facility].  Please advise.

## 2011-10-02 ENCOUNTER — Encounter: Payer: Self-pay | Admitting: Internal Medicine

## 2011-10-25 ENCOUNTER — Ambulatory Visit (INDEPENDENT_AMBULATORY_CARE_PROVIDER_SITE_OTHER): Payer: Medicare Other | Admitting: *Deleted

## 2011-10-25 DIAGNOSIS — E291 Testicular hypofunction: Secondary | ICD-10-CM

## 2011-10-25 MED ORDER — TESTOSTERONE CYPIONATE 200 MG/ML IM SOLN
200.0000 mg | Freq: Once | INTRAMUSCULAR | Status: AC
  Administered 2011-10-25: 200 mg via INTRAMUSCULAR

## 2011-10-31 ENCOUNTER — Ambulatory Visit: Payer: Medicare Other | Admitting: Cardiology

## 2011-11-06 ENCOUNTER — Encounter: Payer: Self-pay | Admitting: Family Medicine

## 2011-11-07 ENCOUNTER — Encounter: Payer: Self-pay | Admitting: Internal Medicine

## 2011-11-07 ENCOUNTER — Ambulatory Visit (INDEPENDENT_AMBULATORY_CARE_PROVIDER_SITE_OTHER): Payer: Medicare Other | Admitting: *Deleted

## 2011-11-07 DIAGNOSIS — I2510 Atherosclerotic heart disease of native coronary artery without angina pectoris: Secondary | ICD-10-CM

## 2011-11-07 DIAGNOSIS — I5022 Chronic systolic (congestive) heart failure: Secondary | ICD-10-CM

## 2011-11-07 DIAGNOSIS — Z95810 Presence of automatic (implantable) cardiac defibrillator: Secondary | ICD-10-CM

## 2011-11-08 LAB — REMOTE ICD DEVICE
AL IMPEDENCE ICD: 437 Ohm
AL THRESHOLD: 1.25 V
BAMS-0001: 170 {beats}/min
BATTERY VOLTAGE: 3.1044 V
CHARGE TIME: 9.439 s
PACEART VT: 0
TOT-0001: 1
TOT-0002: 0
TZAT-0001FASTVT: 1
TZAT-0002ATACH: NEGATIVE
TZAT-0004SLOWVT: 8
TZAT-0005SLOWVT: 88 pct
TZAT-0011SLOWVT: 10 ms
TZAT-0012ATACH: 150 ms
TZAT-0012SLOWVT: 200 ms
TZAT-0013SLOWVT: 3
TZAT-0018ATACH: NEGATIVE
TZAT-0018FASTVT: NEGATIVE
TZAT-0019ATACH: 6 V
TZAT-0019FASTVT: 8 V
TZAT-0020ATACH: 1.5 ms
TZAT-0020ATACH: 1.5 ms
TZAT-0020ATACH: 1.5 ms
TZON-0003SLOWVT: 350 ms
TZON-0003VSLOWVT: 400 ms
TZON-0004SLOWVT: 16
TZST-0001ATACH: 5
TZST-0001ATACH: 6
TZST-0001FASTVT: 2
TZST-0001FASTVT: 4
TZST-0001FASTVT: 6
TZST-0001SLOWVT: 5
TZST-0002ATACH: NEGATIVE
TZST-0002ATACH: NEGATIVE
TZST-0002FASTVT: NEGATIVE
TZST-0002FASTVT: NEGATIVE
TZST-0003SLOWVT: 30 J
TZST-0003SLOWVT: 35 J
TZST-0003SLOWVT: 35 J
VENTRICULAR PACING ICD: 99.98 pct

## 2011-11-12 ENCOUNTER — Encounter (INDEPENDENT_AMBULATORY_CARE_PROVIDER_SITE_OTHER): Payer: Self-pay | Admitting: Surgery

## 2011-11-12 ENCOUNTER — Ambulatory Visit (INDEPENDENT_AMBULATORY_CARE_PROVIDER_SITE_OTHER): Payer: Medicare Other | Admitting: Surgery

## 2011-11-12 VITALS — BP 154/72 | HR 60 | Temp 98.4°F | Resp 14 | Ht 73.5 in | Wt 229.8 lb

## 2011-11-12 DIAGNOSIS — E041 Nontoxic single thyroid nodule: Secondary | ICD-10-CM

## 2011-11-12 NOTE — Patient Instructions (Signed)
Return 6 months.  Ultrasound will be scheduled.

## 2011-11-12 NOTE — Progress Notes (Signed)
Subjective:     Patient ID: [name], male   DOB: Jul 08, 1925, 78 y.o.   MRN: [id number]  HPIPatient returns in followup of his thyroid nodules. These have been stable over the last 6 months. His last ultrasound in November of 2012 which showed stability and no change from his previous ultrasound 6 months prior. He and his wife have had a multitude of infections but currently are doing well.Denies complaints of dysphasia, hoarseness or difficulty swallowing.   Review of Systems  Constitutional: Negative for fever, chills and unexpected weight change.  HENT: Negative for hearing loss, congestion, sore throat, trouble swallowing and voice change.   Eyes: Negative for visual disturbance.  Respiratory: Negative for cough and wheezing.   Cardiovascular: Negative for chest pain, palpitations and leg swelling.  Gastrointestinal: Negative for nausea, vomiting, abdominal pain, diarrhea, constipation, blood in stool, abdominal distention, anal bleeding and rectal pain.  Genitourinary: Negative for hematuria and difficulty urinating.  Musculoskeletal: Negative for arthralgias.  Skin: Negative for rash and wound.  Neurological: Negative for seizures, syncope, weakness and headaches.  Hematological: Negative for adenopathy. Does not bruise/bleed easily.  Psychiatric/Behavioral: Negative for confusion.       Objective:   Physical Exam  Constitutional: He is oriented to person, place, and time. He appears well-developed and well-nourished.  HENT:  Head: Normocephalic and atraumatic.  Neck: Normal range of motion. Neck supple. No tracheal deviation present. No thyromegaly present.  Lymphadenopathy:       Head (right side): No submental, no submandibular, no tonsillar, no preauricular, no posterior auricular and no occipital adenopathy present.       Head (left side): No submental, no submandibular, no tonsillar, no preauricular, no posterior auricular and no occipital adenopathy present.    He  has no cervical adenopathy.  Neurological: He is alert and oriented to person, place, and time. GCS eye subscore is 4. GCS verbal subscore is 5. GCS motor subscore is 6.       Assessment:     Thyroid nodule stable    Plan:     U/S thyroid.  Follow up 6 months.

## 2011-11-15 ENCOUNTER — Encounter: Payer: Self-pay | Admitting: Cardiology

## 2011-11-15 ENCOUNTER — Ambulatory Visit (INDEPENDENT_AMBULATORY_CARE_PROVIDER_SITE_OTHER): Payer: Medicare Other | Admitting: Cardiology

## 2011-11-15 VITALS — BP 145/80 | HR 56 | Ht 73.0 in | Wt 230.8 lb

## 2011-11-15 DIAGNOSIS — I5022 Chronic systolic (congestive) heart failure: Secondary | ICD-10-CM

## 2011-11-15 DIAGNOSIS — R5383 Other fatigue: Secondary | ICD-10-CM

## 2011-11-15 DIAGNOSIS — I2510 Atherosclerotic heart disease of native coronary artery without angina pectoris: Secondary | ICD-10-CM

## 2011-11-15 DIAGNOSIS — R0789 Other chest pain: Secondary | ICD-10-CM

## 2011-11-15 DIAGNOSIS — R5381 Other malaise: Secondary | ICD-10-CM

## 2011-11-15 DIAGNOSIS — I10 Essential (primary) hypertension: Secondary | ICD-10-CM

## 2011-11-15 MED ORDER — FUROSEMIDE 20 MG PO TABS: 20.0000 mg | ORAL_TABLET | ORAL | Status: DC | PRN

## 2011-11-15 MED ORDER — CARVEDILOL 12.5 MG PO TABS: 12.5000 mg | ORAL_TABLET | Freq: Two times a day (BID) | ORAL | Status: DC

## 2011-11-15 NOTE — Patient Instructions (Addendum)
Please take Lasix 20 (furosemide) mg once a day for 2 days - then only as needed. Decrease Carvedilol to 12.5 mg twice a day  (1/2 tablet twice a day) to see if fatigue improves.  Continue all other medications as listed.  Your physician has requested that you have a lexiscan myoview. For further information please visit [email or web address] Please follow instruction sheet, as given.  Follow up with Dr Antoine Poche in 3 months

## 2011-11-15 NOTE — Assessment & Plan Note (Signed)
I suggested that he might try to reduce his beta blocker to 12.5 twice a day to see if his fatigue improves. If not we will slowly titrate back to the current dose.

## 2011-11-15 NOTE — Progress Notes (Signed)
HPI The patient presents for followup of his cardiomyopathy.  Since I last saw him he did have some evidence of excess volume on his Optivol when he saw Dr. Ladona Ridgel. He was told to reduce his salt and fluid. However, I don't think he's doing this. He doesn't report any PND or orthopnea. He does have some dyspnea with mild to moderate exertion however. He does report increasing fatigue with lack of exercise tolerance. He also mentions chest discomfort with his highest level of physical exertion. He will stop what he is doing and it will go away. He's not describing associated symptoms such as nausea vomiting or diaphoresis. He's had no noticeable palpitations, presyncope or syncope. He does have some mild lower extremity edema.  Allergies  Allergen Reactions  . Celecoxib     REACTION: myalgia: abdomenal pain  . Nsaids     REACTION: renal disease  . Rosuvastatin     REACTION: myalgia: musle pain    Current Outpatient Prescriptions  Medication Sig Dispense Refill  . acetaminophen (TYLENOL) 325 MG tablet Take 650 mg by mouth every 6 (six) hours as needed.        [name] aspirin 81 MG tablet Take 81 mg by mouth daily.        . B-D UF III MINI PEN NEEDLES 31G X 5 MM MISC USE AS DIRECTED  30 each  11  . carvedilol (COREG) 25 MG tablet Take 1 tablet (25 mg total) by mouth 2 (two) times daily.  180 tablet  3  . CVS Alcohol Swabs PADS USE AS NEEDED  3 each  3  . GLUCOSAMINE CHONDROITIN COMPLX PO Take 1 capsule by mouth 2 (two) times daily.       [name] glucose blood (BAYER CONTOUR TEST) test strip Check blood sugar daily  100 each  3  . insulin glargine (LANTUS SOLOSTAR) 100 UNIT/ML injection Inject 25 Units into the skin at bedtime.       [name] lisinopril (PRINIVIL,ZESTRIL) 2.5 MG tablet Take 2.5 mg by mouth daily.        . nitroGLYCERIN (NITROSTAT) 0.4 MG SL tablet Place 1 tablet (0.4 mg total) under the tongue every 5 (five) minutes as needed.  25 tablet  6  . Nutritional Supplements (GLUCERNA SHAKE PO) Take 4  oz by mouth daily after breakfast.        . pravastatin (PRAVACHOL) 40 MG tablet TAKE 1 TABLET BY MOUTH AT BEDTIME  30 tablet  11  . ranitidine (ZANTAC) 75 MG tablet Take 75 mg by mouth as needed.        . testosterone cypionate (DEPOTESTOTERONE CYPIONATE) 200 MG/ML injection Inject 1.25 mLs (250 mg total) into the muscle every 28 (twenty-eight) days.  10 mL  0  . Vitamin D, Ergocalciferol, (DRISDOL) 50000 UNITS CAPS Take 50,000 Units by mouth every 7 (seven) days.        Past Medical History  Diagnosis Date  . HTN (hypertension) (06/17/1981)  . Diverticula of colon (06/18/1983)  . Renal insufficiency   . Hyperlipidemia 06/17/1993)  . Diabetes mellitus  (06/17/1997)  . PUD (peptic ulcer disease) 1960's  . Pancreatitis 01/24 - 07/13/08    ABD CT  fatty liver early pancreatitis  07/12/2008  Pelvic CT Nml 07/12/2008  . Cardiomyopathy     EF 25% Ant Akinesis Global Hypo 3/2-08/18/08  . Thyroid nodule     per Dr. Luisa Hart  . Renal disease     per Dr. Allena Katz  . Hypogonadism male  Past Surgical History  Procedure Date  . Colonoscopy   . Cardiac catheterization      Severe 3-vessel coronary artery disease.  Ischemic   cardiomyopathy.   . Nephrectomy 01/09/09    Clear Cell Renal Cell Carcinoma 01/09/2009  . Cardiac defibrillator placement     ICD-Medtronic .  [id number]  . Laparotomy      small mid 80's (laparotomy), 09/12/2000 (no surgery), 10/20/2008 (no surgery)  . Lesion removal     RLE 11/14/2006 (Dr Lindie Spruce)   . Ct of abdomen 06/1997    Fatty process unchanged - myelolipoma adrenal unchangd  . Ct of abdomen 02/99    Adrenal mass unchanged, no further follow up required  . Sbo 08/2000 no surgery   10/20/08 no surgery    Mid 80's laparotomy  . Esophagogastroduodenoscopy 07/12/2008    Schatzki Ring 2 cm. HH (Dr. Arlyce Dice)  . Abdominal US 07/12/08    Fatty infiltrate liver complex cyst in lower pole left kidney  . Left renal ca 2010    s/p partial nephrectomy per Dr. Laverle Patter  . Icd/ crt  (lv lead disabled)     Medtronic [id number]  . Coronary artery bypass graft 09/05/08     Mediastinotomy, extracorporeal circulation,   coronary artery bypass graft surgery x4 using a left internal mammary  artery graft to the left anterior descending coronary artery, with a  saphenous vein graft to the first diagonal branch of the LAD, a   saphenous vein graft to the obtuse marginal branch of the left   circumflex coronary artery,  Dr. Laneta Simmers  . Cholecystectomy 1970's    ROS:  As stated in the HPI and negative for all other systems.  PHYSICAL EXAM BP 145/80  Pulse 56  Ht 6\' 1"  (1.854 m)  Wt 230 lb 12.8 oz (104.69 kg)  BMI 30.45 kg/m2 GENERAL:  Well appearing NECK:  No jugular venous distention, waveform within normal limits, carotid upstroke brisk and symmetric, no bruits, no thyromegaly LYMPHATICS:  No cervical, inguinal adenopathy LUNGS:  Clear to auscultation bilaterally BACK:  No CVA tenderness CHEST:  Well healed sternotomy scar, ICD pocket intact HEART:  PMI not displaced or sustained,S1 and S2 within normal limits, no S3, no S4, no clicks, no rubs, no murmurs ABD:  Flat, positive bowel sounds normal in frequency in pitch, no bruits, no rebound, no guarding, no midline pulsatile mass, no hepatomegaly, no splenomegaly, small umbilical hernia EXT:  2 plus pulses throughout, mild edema, no cyanosis no clubbing NEURO:  Cranial nerves II through XII grossly intact, motor grossly intact throughout PSYCH:  Cognitively intact, oriented to person place and time SKIN:  No rashes or nodules  EKG:  Sinus rhythm with ventricular pacing.  Rate  56. 11/15/2011  ASSESSMENT AND PLAN

## 2011-11-15 NOTE — Assessment & Plan Note (Signed)
This is being managed in the context of treating his CHF  

## 2011-11-15 NOTE — Assessment & Plan Note (Signed)
I am concerned that he's describing ongoing angina. He needs stress testing but would not be able to walk on a treadmill. Therefore, he'll have a Lexiscan Myoview.

## 2011-11-15 NOTE — Assessment & Plan Note (Signed)
I think he has some excess volume. I'm going to give him just 2 days of diuretic because of his renal insufficiency. We discussed salt and fluid restriction. He has not been participating with his salt restriction.

## 2011-11-18 ENCOUNTER — Encounter: Payer: Self-pay | Admitting: *Deleted

## 2011-11-20 ENCOUNTER — Other Ambulatory Visit: Payer: Self-pay | Admitting: Dermatology

## 2011-11-21 ENCOUNTER — Ambulatory Visit (HOSPITAL_COMMUNITY): Payer: Medicare Other | Attending: Cardiology | Admitting: Radiology

## 2011-11-21 VITALS — BP 150/72 | Ht 73.0 in | Wt 228.0 lb

## 2011-11-21 DIAGNOSIS — R079 Chest pain, unspecified: Secondary | ICD-10-CM | POA: Insufficient documentation

## 2011-11-21 DIAGNOSIS — R0789 Other chest pain: Secondary | ICD-10-CM

## 2011-11-21 DIAGNOSIS — I2510 Atherosclerotic heart disease of native coronary artery without angina pectoris: Secondary | ICD-10-CM

## 2011-11-21 DIAGNOSIS — I447 Left bundle-branch block, unspecified: Secondary | ICD-10-CM

## 2011-11-21 MED ORDER — ADENOSINE (DIAGNOSTIC) 3 MG/ML IV SOLN
0.5600 mg/kg | Freq: Once | INTRAVENOUS | Status: AC
  Administered 2011-11-21: 57.9 mg via INTRAVENOUS

## 2011-11-21 MED ORDER — TECHNETIUM TC 99M TETROFOSMIN IV KIT
32.9000 | PACK | Freq: Once | INTRAVENOUS | Status: AC | PRN
  Administered 2011-11-21: 32.9 via INTRAVENOUS

## 2011-11-21 MED ORDER — TECHNETIUM TC 99M TETROFOSMIN IV KIT
11.0000 | PACK | Freq: Once | INTRAVENOUS | Status: AC | PRN
  Administered 2011-11-21: 11 via INTRAVENOUS

## 2011-11-21 NOTE — Progress Notes (Signed)
[facility] SITE 3 NUCLEAR MED [address] [place] [address] [phone]  Cardiology Nuclear Med Study  [name] is a 78 y.o. male     MRN : [id number]     DOB: 09/29/1925  Procedure Date: 11/21/2011  Nuclear Med Background Indication for Stress Test:  Evaluation for Ischemia and Graft Patency History:  08/05/2008: MPS: Abnormal Adenosine small fixed defect anterior marked LVD,3/10: Heart Cath: severe 3V Dz--CABGx4  ICM EF: 25%,5/11: ECHO: EF: 20-25% mild LVH, 10/11 Defibrillator: ICM  Cardiac Risk Factors: Family History - CAD, Hypertension, IDDM Type 2, LBBB and Lipids  Symptoms:  Chest Pain, DOE, Fatigue and Fatigue with Exertion   Nuclear Pre-Procedure Caffeine/Decaff Intake:  None NPO After: 7:00am   Lungs:  clear O2 Sat: 97% on room air. IV 0.9% NS with Angio Cath:  20g  IV Site: R Forearm  IV Started by:  Stanton Kidney, EMT-P  Chest Size (in):  44 Cup Size: n/a  Height: 6\' 1"  (1.854 m)  Weight:  228 lb (103.42 kg)  BMI:  Body mass index is 30.08 kg/(m^2). Tech Comments:  meds as directed    Nuclear Med Study 1 or 2 day study: 1 day  Stress Test Type:  Adenosine  Reading MD: Willa Rough, MD  Order Authorizing Provider:  J.Hochrein  Resting Radionuclide: Technetium 46m Tetrofosmin  Resting Radionuclide Dose: 11.0 mCi   Stress Radionuclide:  Technetium 49m Tetrofosmin  Stress Radionuclide Dose: 32.9 mCi           Stress Protocol Rest HR: 61 Stress HR: 65  Rest BP: 150/72 Stress BP: 130/62  Exercise Time (min): n/a METS: n/a   Predicted Max HR: 137 bpm % Max HR: 44.53 bpm Rate Pressure Product: 9150   Dose of Adenosine (mg):  58.0 Dose of Lexiscan: n/a mg  Dose of Atropine (mg): n/a Dose of Dobutamine: n/a mcg/kg/min (at max HR)  Stress Test Technologist: Milana Na, EMT-P  Nuclear Technologist:  Domenic Polite, CNMT     Rest Procedure:  Myocardial perfusion imaging was performed at rest 45 minutes following the intravenous  administration of Technetium 46m Tetrofosmin. Rest ECG: SR VPacing with LBBB and occ PVCS  Stress Procedure:  The patient received IV adenosine at 140 mcg/kg/min for 4 minutes.  There were non DX  And occ pvcs with infusion.  Technetium 58m Tetrofosmin was injected at the 2 minute mark and quantitative spect images were obtained after a 45 minute delay. Stress ECG: No significant change from baseline ECG  QPS Raw Data Images:  Patient motion noted; appropriate software correction applied. Stress Images:  There is moderate decreased uptake in a medium-sized area affecting the inferior and inferolateral walls.[name] Specifically it affects the base mid and apical segments of the inferior wall and inferolateral wall. Rest Images:  The rest images appear the same as the stress images. Subtraction (SDS):  No evidence of ischemia. Transient Ischemic Dilatation (Normal <1.22):  0.99 Lung/Heart Ratio (Normal <0.45):  0.32  Quantitative Gated Spect Images QGS EDV:  202 ml QGS ESV:  149 ml  Impression Exercise Capacity:  Adenosine study with no exercise. BP Response:  Normal blood pressure response. Clinical Symptoms:  shortness of breath ECG Impression:  No significant ST segment change suggestive of ischemia. Comparison with Prior Nuclear Study: No images to compare  Overall Impression:  This is an abnormal nuclear stress study. There is significant left ventricular dysfunction. In addition there is moderate scar in a medium-sized area affecting the  inferolateral wall and inferior wall. There is no definite ischemia seen.  LV Ejection Fraction: 26%.  LV Wall Motion:    There is significant dyssynergy of the septum. This is probably a postoperative change. Otherwise there is diffuse hypokinesis.  Willa Rough, MD

## 2011-12-02 ENCOUNTER — Other Ambulatory Visit: Payer: Self-pay | Admitting: Cardiology

## 2011-12-11 ENCOUNTER — Ambulatory Visit (INDEPENDENT_AMBULATORY_CARE_PROVIDER_SITE_OTHER): Payer: Medicare Other | Admitting: *Deleted

## 2011-12-11 ENCOUNTER — Other Ambulatory Visit: Payer: Self-pay | Admitting: *Deleted

## 2011-12-11 DIAGNOSIS — E291 Testicular hypofunction: Secondary | ICD-10-CM

## 2011-12-11 MED ORDER — TESTOSTERONE CYPIONATE 200 MG/ML IM SOLN
250.0000 mg | Freq: Once | INTRAMUSCULAR | Status: AC
  Administered 2011-12-11: 250 mg via INTRAMUSCULAR

## 2011-12-11 MED ORDER — TESTOSTERONE CYPIONATE 200 MG/ML IM SOLN: 250.0000 mg | INTRAMUSCULAR | Status: DC

## 2011-12-11 NOTE — Telephone Encounter (Signed)
Please call in rx.  Thanks.  

## 2011-12-11 NOTE — Telephone Encounter (Signed)
Patient came in today for testosterone injection and needs a refill, ok to refill?

## 2011-12-11 NOTE — Telephone Encounter (Signed)
Rx called in as directed.   

## 2011-12-24 ENCOUNTER — Other Ambulatory Visit: Payer: Self-pay | Admitting: Family Medicine

## 2012-01-23 ENCOUNTER — Ambulatory Visit (INDEPENDENT_AMBULATORY_CARE_PROVIDER_SITE_OTHER): Payer: Medicare Other | Admitting: *Deleted

## 2012-01-23 DIAGNOSIS — E349 Endocrine disorder, unspecified: Secondary | ICD-10-CM

## 2012-01-23 DIAGNOSIS — E291 Testicular hypofunction: Secondary | ICD-10-CM

## 2012-01-23 MED ORDER — TESTOSTERONE CYPIONATE 200 MG/ML IM SOLN
200.0000 mg | Freq: Once | INTRAMUSCULAR | Status: AC
  Administered 2012-01-23: 200 mg via INTRAMUSCULAR

## 2012-02-03 ENCOUNTER — Encounter: Payer: Self-pay | Admitting: Cardiology

## 2012-02-03 ENCOUNTER — Ambulatory Visit (INDEPENDENT_AMBULATORY_CARE_PROVIDER_SITE_OTHER): Payer: Medicare Other | Admitting: Cardiology

## 2012-02-03 VITALS — BP 130/80 | HR 72 | Ht 74.0 in | Wt 228.0 lb

## 2012-02-03 DIAGNOSIS — I2510 Atherosclerotic heart disease of native coronary artery without angina pectoris: Secondary | ICD-10-CM

## 2012-02-03 DIAGNOSIS — I5022 Chronic systolic (congestive) heart failure: Secondary | ICD-10-CM

## 2012-02-03 NOTE — Patient Instructions (Addendum)
The current medical regimen is effective;  continue present plan and medications.  Follow up in 6 months with Dr Hochrein.  You will receive a letter in the mail 2 months before you are due.  Please call us when you receive this letter to schedule your follow up appointment.  

## 2012-02-03 NOTE — Progress Notes (Signed)
HPI The patient presents for followup of his cardiomyopathy. At the last appointment he was having some increased dyspnea, fatigue and chest discomfort. I sent him for a nuclear stress test. This demonstrated significant left ventricular dysfunction. In addition there is moderate scar in a medium-sized area affecting the inferolateral wall and inferior wall. There is no definite ischemia seen. LV Ejection Fraction was 26%.  This is essentially unchanged from previous. Since then and on a lower dose of beta blocker and while taking in less fluids he's feeling somewhat better. He certainly not any worse. He has a little less fatigue. He is limited by knee pain is getting injections. He's not having any resting chest pressure. He's not describing PND or orthopnea. He says he is urinating a lot and thinks it is probably down though that's not reflect a day.  Allergies  Allergen Reactions  . Celecoxib     REACTION: myalgia: abdomenal pain  . Nsaids     REACTION: renal disease  . Rosuvastatin     REACTION: myalgia: musle pain    Current Outpatient Prescriptions  Medication Sig Dispense Refill  . acetaminophen (TYLENOL) 325 MG tablet Take 650 mg by mouth every 6 (six) hours as needed.        [name] aspirin 81 MG tablet Take 81 mg by mouth daily.        . B-D UF III MINI PEN NEEDLES 31G X 5 MM MISC USE AS DIRECTED  30 each  11  . carvedilol (COREG) 12.5 MG tablet Take 1 tablet (12.5 mg total) by mouth 2 (two) times daily.  180 tablet  3  . cholecalciferol (VITAMIN D) 1000 UNITS tablet Take 2,000 Units by mouth daily.      . CVS Alcohol Swabs PADS USE AS NEEDED  3 each  3  . GLUCOSAMINE CHONDROITIN COMPLX PO Take 1 capsule by mouth 2 (two) times daily.       [name] glucose blood (BAYER CONTOUR TEST) test strip Check blood sugar daily  100 each  3  . insulin glargine (LANTUS SOLOSTAR) 100 UNIT/ML injection Inject 25 Units into the skin at bedtime.       [name] lisinopril (PRINIVIL,ZESTRIL) 2.5 MG tablet TAKE 1  TABLET BY MOUTH DAILY  30 tablet  7  . nitroGLYCERIN (NITROSTAT) 0.4 MG SL tablet Place 1 tablet (0.4 mg total) under the tongue every 5 (five) minutes as needed.  25 tablet  6  . Nutritional Supplements (GLUCERNA SHAKE PO) Take 4 oz by mouth daily after breakfast.        . pravastatin (PRAVACHOL) 40 MG tablet TAKE 1 TABLET BY MOUTH AT BEDTIME  30 tablet  9  . ranitidine (ZANTAC) 75 MG tablet Take 75 mg by mouth as needed.        . testosterone cypionate (DEPOTESTOTERONE CYPIONATE) 200 MG/ML injection Inject 1.25 mLs (250 mg total) into the muscle every 28 (twenty-eight) days.  10 mL  0    Past Medical History  Diagnosis Date  . HTN (hypertension) (06/17/1981)  . Diverticula of colon (06/18/1983)  . Renal insufficiency   . Hyperlipidemia 06/17/1993)  . Diabetes mellitus  (06/17/1997)  . PUD (peptic ulcer disease) 1960's  . Pancreatitis 01/24 - 07/13/08    ABD CT  fatty liver early pancreatitis  07/12/2008  Pelvic CT Nml 07/12/2008  . Cardiomyopathy     EF 25% Ant Akinesis Global Hypo 3/2-08/18/08  . Thyroid nodule     per Dr. Luisa Hart  . Renal disease  per Dr. Allena Katz  . Hypogonadism male     Past Surgical History  Procedure Date  . Colonoscopy   . Cardiac catheterization      Severe 3-vessel coronary artery disease.  Ischemic   cardiomyopathy.   . Nephrectomy 01/09/09    Clear Cell Renal Cell Carcinoma 01/09/2009  . Cardiac defibrillator placement     ICD-Medtronic .  [id number]  . Laparotomy      small mid 80's (laparotomy), 09/12/2000 (no surgery), 10/20/2008 (no surgery)  . Lesion removal     RLE 11/14/2006 (Dr Lindie Spruce)   . Ct of abdomen 06/1997    Fatty process unchanged - myelolipoma adrenal unchangd  . Ct of abdomen 02/99    Adrenal mass unchanged, no further follow up required  . Sbo 08/2000 no surgery   10/20/08 no surgery    Mid 80's laparotomy  . Esophagogastroduodenoscopy 07/12/2008    Schatzki Ring 2 cm. HH (Dr. Arlyce Dice)  . Abdominal US 07/12/08    Fatty infiltrate  liver complex cyst in lower pole left kidney  . Left renal ca 2010    s/p partial nephrectomy per Dr. Laverle Patter  . Icd/ crt (lv lead disabled)     Medtronic [id number]  . Coronary artery bypass graft 09/05/08     Mediastinotomy, extracorporeal circulation,   coronary artery bypass graft surgery x4 using a left internal mammary  artery graft to the left anterior descending coronary artery, with a  saphenous vein graft to the first diagonal branch of the LAD, a   saphenous vein graft to the obtuse marginal branch of the left   circumflex coronary artery,  Dr. Laneta Simmers  . Cholecystectomy 1970's    ROS:  As stated in the HPI and negative for all other systems.  PHYSICAL EXAM BP 130/80  Pulse 72  Ht 6\' 2"  (1.88 m)  Wt 228 lb (103.42 kg)  BMI 29.27 kg/m2 GENERAL:  Well appearing NECK:  No jugular venous distention, waveform within normal limits, carotid upstroke brisk and symmetric, no bruits, no thyromegaly LYMPHATICS:  No cervical, inguinal adenopathy LUNGS:  Clear to auscultation bilaterally BACK:  No CVA tenderness CHEST:  Well healed sternotomy scar, ICD pocket intact HEART:  PMI not displaced or sustained,S1 and S2 within normal limits, no S3, no S4, no clicks, no rubs, no murmurs ABD:  Flat, positive bowel sounds normal in frequency in pitch, no bruits, no rebound, no guarding, no midline pulsatile mass, no hepatomegaly, no splenomegaly, small umbilical hernia EXT:  2 plus pulses throughout, mild edema, no cyanosis no clubbing   ASSESSMENT AND PLAN  CAD (coronary artery disease) -  Given the fact that his symptoms have improved and there was no significant change on his nuclear study no further imaging is indicated. If he has worsening symptoms in the future he would need heart catheterization. For now he will continue the meds as listed.  CHRONIC SYSTOLIC HEART FAILURE -  He seems to be euvolemic today and he will continue the meds as listed.  HYPERTENSION -  This is being managed  in the context of treating his CHF   Fatigue -  He is less fatigued dose of beta blocker. I will not try to titrate this in the future.

## 2012-02-10 ENCOUNTER — Ambulatory Visit (HOSPITAL_COMMUNITY)
Admission: RE | Admit: 2012-02-10 | Discharge: 2012-02-10 | Disposition: A | Discharge status: Home / Self Care | Payer: Medicare Other | Source: Ambulatory Visit | Attending: Urology | Admitting: Urology

## 2012-02-10 ENCOUNTER — Other Ambulatory Visit (HOSPITAL_COMMUNITY): Payer: Self-pay | Admitting: Urology

## 2012-02-10 DIAGNOSIS — I517 Cardiomegaly: Secondary | ICD-10-CM | POA: Insufficient documentation

## 2012-02-10 DIAGNOSIS — C649 Malignant neoplasm of unspecified kidney, except renal pelvis: Secondary | ICD-10-CM | POA: Insufficient documentation

## 2012-02-10 DIAGNOSIS — Z95810 Presence of automatic (implantable) cardiac defibrillator: Secondary | ICD-10-CM | POA: Insufficient documentation

## 2012-02-13 ENCOUNTER — Encounter: Payer: Self-pay | Admitting: Internal Medicine

## 2012-02-13 ENCOUNTER — Ambulatory Visit (INDEPENDENT_AMBULATORY_CARE_PROVIDER_SITE_OTHER): Payer: Medicare Other | Admitting: *Deleted

## 2012-02-13 DIAGNOSIS — I5022 Chronic systolic (congestive) heart failure: Secondary | ICD-10-CM

## 2012-02-13 DIAGNOSIS — Z95810 Presence of automatic (implantable) cardiac defibrillator: Secondary | ICD-10-CM

## 2012-02-14 LAB — REMOTE ICD DEVICE
AL AMPLITUDE: 2.1 mv
AL IMPEDENCE ICD: 551 Ohm
ATRIAL PACING ICD: 0.05 pct
CHARGE TIME: 9.439 s
FVT: 0
LV LEAD IMPEDENCE ICD: 703 Ohm
LV LEAD THRESHOLD: 0.5 V
RV LEAD AMPLITUDE: 8.3 mv
RV LEAD IMPEDENCE ICD: 418 Ohm
TOT-0001: 1
TOT-0002: 0
TOT-0006: 20111025000000
TZAT-0001ATACH: 1
TZAT-0001ATACH: 2
TZAT-0001FASTVT: 1
TZAT-0002ATACH: NEGATIVE
TZAT-0002ATACH: NEGATIVE
TZAT-0002FASTVT: NEGATIVE
TZAT-0011SLOWVT: 10 ms
TZAT-0012ATACH: 150 ms
TZAT-0012FASTVT: 200 ms
TZAT-0012SLOWVT: 200 ms
TZAT-0018ATACH: NEGATIVE
TZAT-0018ATACH: NEGATIVE
TZAT-0018ATACH: NEGATIVE
TZAT-0018SLOWVT: NEGATIVE
TZAT-0019ATACH: 6 V
TZAT-0019SLOWVT: 8 V
TZAT-0020ATACH: 1.5 ms
TZAT-0020FASTVT: 1.5 ms
TZAT-0020SLOWVT: 1.5 ms
TZON-0003ATACH: 350 ms
TZON-0004VSLOWVT: 20
TZON-0005SLOWVT: 12
TZST-0001FASTVT: 3
TZST-0001FASTVT: 4
TZST-0001FASTVT: 5
TZST-0001SLOWVT: 3
TZST-0001SLOWVT: 4
TZST-0001SLOWVT: 6
TZST-0002ATACH: NEGATIVE
TZST-0002ATACH: NEGATIVE
TZST-0002FASTVT: NEGATIVE
TZST-0002FASTVT: NEGATIVE
TZST-0003SLOWVT: 35 J

## 2012-02-24 ENCOUNTER — Encounter: Payer: Self-pay | Admitting: Family Medicine

## 2012-02-28 ENCOUNTER — Ambulatory Visit (INDEPENDENT_AMBULATORY_CARE_PROVIDER_SITE_OTHER): Payer: Medicare Other | Admitting: *Deleted

## 2012-02-28 DIAGNOSIS — E349 Endocrine disorder, unspecified: Secondary | ICD-10-CM

## 2012-02-28 DIAGNOSIS — E291 Testicular hypofunction: Secondary | ICD-10-CM

## 2012-02-28 MED ORDER — TESTOSTERONE CYPIONATE 200 MG/ML IM SOLN
200.0000 mg | Freq: Once | INTRAMUSCULAR | Status: AC
  Administered 2012-02-28: 200 mg via INTRAMUSCULAR

## 2012-03-11 ENCOUNTER — Encounter: Payer: Self-pay | Admitting: *Deleted

## 2012-03-13 ENCOUNTER — Other Ambulatory Visit: Payer: Self-pay | Admitting: Family Medicine

## 2012-04-03 ENCOUNTER — Telehealth (INDEPENDENT_AMBULATORY_CARE_PROVIDER_SITE_OTHER): Payer: Self-pay

## 2012-04-03 ENCOUNTER — Other Ambulatory Visit (INDEPENDENT_AMBULATORY_CARE_PROVIDER_SITE_OTHER): Payer: Self-pay

## 2012-04-03 DIAGNOSIS — E041 Nontoxic single thyroid nodule: Secondary | ICD-10-CM

## 2012-04-03 NOTE — Telephone Encounter (Signed)
I called to give patient [name] thyroid appt. Phone busy for 2 hrs now. I will keep trying to call patient. He is scheduled for 04/07/12 arrival 1:00

## 2012-04-07 ENCOUNTER — Ambulatory Visit
Admission: RE | Admit: 2012-04-07 | Discharge: 2012-04-07 | Disposition: A | Discharge status: Home / Self Care | Payer: Medicare Other | Source: Ambulatory Visit | Attending: Surgery | Admitting: Surgery

## 2012-04-07 DIAGNOSIS — E041 Nontoxic single thyroid nodule: Secondary | ICD-10-CM

## 2012-04-14 ENCOUNTER — Ambulatory Visit (INDEPENDENT_AMBULATORY_CARE_PROVIDER_SITE_OTHER): Payer: Medicare Other | Admitting: *Deleted

## 2012-04-14 DIAGNOSIS — E349 Endocrine disorder, unspecified: Secondary | ICD-10-CM

## 2012-04-14 DIAGNOSIS — E291 Testicular hypofunction: Secondary | ICD-10-CM

## 2012-04-14 MED ORDER — TESTOSTERONE CYPIONATE 200 MG/ML IM SOLN
200.0000 mg | Freq: Once | INTRAMUSCULAR | Status: AC
  Administered 2012-04-14: 200 mg via INTRAMUSCULAR

## 2012-04-20 ENCOUNTER — Ambulatory Visit (INDEPENDENT_AMBULATORY_CARE_PROVIDER_SITE_OTHER): Payer: Medicare Other | Admitting: Surgery

## 2012-04-20 ENCOUNTER — Encounter (INDEPENDENT_AMBULATORY_CARE_PROVIDER_SITE_OTHER): Payer: Self-pay | Admitting: Surgery

## 2012-04-20 VITALS — BP 124/66 | HR 76 | Temp 97.1°F | Resp 16 | Ht 73.5 in | Wt 226.8 lb

## 2012-04-20 DIAGNOSIS — E041 Nontoxic single thyroid nodule: Secondary | ICD-10-CM

## 2012-04-20 NOTE — Patient Instructions (Signed)
Your thyroid nodule is stable.  No follow up needed unless something changes on future exam.

## 2012-04-20 NOTE — Progress Notes (Signed)
Subjective:     Patient ID: [name], male   DOB: 10-27-1925, 78 y.o.   MRN: [id number]  HPI      Patient returns in followup of his thyroid nodules. These have been stable over the last 12  months. His last ultrasound in November of 2012 which showed stability and no change from his previous ultrasound 6 months prior. He and his wife have had a multitude of infections but currently are doing well.Denies complaints of dysphasia, hoarseness or difficulty swallowing.   Review of Systems  Constitutional: Negative for fever, chills and unexpected weight change.  HENT: Negative for hearing loss, congestion, sore throat, trouble swallowing and voice change.   Eyes: Negative for visual disturbance.  Respiratory: Negative for cough and wheezing.   Cardiovascular: Negative for chest pain, palpitations and leg swelling.  Gastrointestinal: Negative for nausea, vomiting, abdominal pain, diarrhea, constipation, blood in stool, abdominal distention, anal bleeding and rectal pain.  Genitourinary: Negative for hematuria and difficulty urinating.  Musculoskeletal: Negative for arthralgias.  Skin: Negative for rash and wound.  Neurological: Negative for seizures, syncope, weakness and headaches.  Hematological: Negative for adenopathy. Does not bruise/bleed easily.  Psychiatric/Behavioral: Negative for confusion.       Objective:   Physical Exam  Constitutional: He is oriented to person, place, and time. He appears well-developed and well-nourished.  HENT:  Head: Normocephalic and atraumatic.  Neck: Normal range of motion. Neck supple. No tracheal deviation present. No thyromegaly present.  Lymphadenopathy:       Head (right side): No submental, no submandibular, no tonsillar, no preauricular, no posterior auricular and no occipital adenopathy present.       Head (left side): No submental, no submandibular, no tonsillar, no preauricular, no posterior auricular and no occipital adenopathy  present.    He has no cervical adenopathy.  Neurological: He is alert and oriented to person, place, and time. GCS eye subscore is 4. GCS verbal subscore is 5. GCS motor subscore is 6.  Clinical Data: Follow up of thyroid nodules.  THYROID ULTRASOUND  Technique: Ultrasound examination of the thyroid gland and adjacent  soft tissues was performed.  Comparison: 05/13/2011  Findings:  Right thyroid lobe: 6.3 x 2.7 x 2.4 cm.  Left thyroid lobe: 5.3 x 2.2 x 2.3 cm  Isthmus: 1.0 cm  Focal nodules: A heterogeneous, partially hyperechoic and  partially hypoechoic isthmic nodule measures 1.4 x 1.0 x 1.4 cm.  On the prior exam, this measured similarly, at 1.5 x 0.7 x 1.4 cm.  Similar morphology on that study.  Other bilateral nodules which measure less than or equal to 6 mm  and are of doubtful clinical significance.  Lymphadenopathy: None visualized.  IMPRESSION:  Similar size and morphology of a dominant isthmic nodule.  No enlarging or suspicious nodules identified.  Original Report Authenticated By: Consuello Bossier, M.D.       Assessment:     Thyroid nodules  Stable 18 months    Plan:     No further follow up as needed.    Stable   Exam U/S stable no further follow up needed.[name]

## 2012-05-18 ENCOUNTER — Telehealth: Payer: Self-pay | Admitting: Internal Medicine

## 2012-05-18 ENCOUNTER — Ambulatory Visit (INDEPENDENT_AMBULATORY_CARE_PROVIDER_SITE_OTHER): Payer: Medicare Other | Admitting: *Deleted

## 2012-05-18 ENCOUNTER — Encounter: Payer: Self-pay | Admitting: Internal Medicine

## 2012-05-18 DIAGNOSIS — Z95810 Presence of automatic (implantable) cardiac defibrillator: Secondary | ICD-10-CM

## 2012-05-18 DIAGNOSIS — I5022 Chronic systolic (congestive) heart failure: Secondary | ICD-10-CM

## 2012-05-18 NOTE — Telephone Encounter (Signed)
Plz return call to pt [phone] cell, regarding issues with remote transmission

## 2012-05-18 NOTE — Telephone Encounter (Signed)
Transmission received, patient aware. 

## 2012-05-19 ENCOUNTER — Ambulatory Visit (INDEPENDENT_AMBULATORY_CARE_PROVIDER_SITE_OTHER): Payer: Medicare Other | Admitting: *Deleted

## 2012-05-19 DIAGNOSIS — E349 Endocrine disorder, unspecified: Secondary | ICD-10-CM

## 2012-05-19 DIAGNOSIS — Z23 Encounter for immunization: Secondary | ICD-10-CM

## 2012-05-19 MED ORDER — TESTOSTERONE CYPIONATE 200 MG/ML IM SOLN
200.0000 mg | Freq: Once | INTRAMUSCULAR | Status: AC
  Administered 2012-05-19: 200 mg via INTRAMUSCULAR

## 2012-05-25 LAB — REMOTE ICD DEVICE
AL AMPLITUDE: 2.1 mv
AL THRESHOLD: 1.875 V
BATTERY VOLTAGE: 3.0695 V
LV LEAD IMPEDENCE ICD: 684 Ohm
RV LEAD IMPEDENCE ICD: 380 Ohm
RV LEAD THRESHOLD: 1.25 V
TOT-0006: 20111025000000
TZAT-0001ATACH: 1
TZAT-0001ATACH: 2
TZAT-0001ATACH: 3
TZAT-0001FASTVT: 1
TZAT-0001SLOWVT: 1
TZAT-0002FASTVT: NEGATIVE
TZAT-0005SLOWVT: 88 pct
TZAT-0012ATACH: 150 ms
TZAT-0012ATACH: 150 ms
TZAT-0012ATACH: 150 ms
TZAT-0013SLOWVT: 3
TZAT-0018ATACH: NEGATIVE
TZAT-0018ATACH: NEGATIVE
TZAT-0018FASTVT: NEGATIVE
TZAT-0018SLOWVT: NEGATIVE
TZAT-0019FASTVT: 8 V
TZAT-0019SLOWVT: 8 V
TZAT-0020ATACH: 1.5 ms
TZAT-0020ATACH: 1.5 ms
TZAT-0020ATACH: 1.5 ms
TZAT-0020SLOWVT: 1.5 ms
TZON-0003ATACH: 350 ms
TZON-0003VSLOWVT: 400 ms
TZON-0005SLOWVT: 12
TZST-0001ATACH: 4
TZST-0001ATACH: 6
TZST-0001FASTVT: 3
TZST-0001FASTVT: 4
TZST-0001FASTVT: 5
TZST-0001SLOWVT: 2
TZST-0001SLOWVT: 4
TZST-0001SLOWVT: 6
TZST-0002ATACH: NEGATIVE
TZST-0002FASTVT: NEGATIVE
TZST-0002FASTVT: NEGATIVE
TZST-0003SLOWVT: 30 J
TZST-0003SLOWVT: 35 J
TZST-0003SLOWVT: 35 J
VF: 0

## 2012-05-27 ENCOUNTER — Encounter: Payer: Self-pay | Admitting: *Deleted

## 2012-05-28 ENCOUNTER — Ambulatory Visit (INDEPENDENT_AMBULATORY_CARE_PROVIDER_SITE_OTHER): Payer: Medicare Other | Admitting: Family Medicine

## 2012-05-28 ENCOUNTER — Encounter: Payer: Self-pay | Admitting: Family Medicine

## 2012-05-28 VITALS — BP 140/68 | HR 71 | Temp 97.5°F | Wt 209.0 lb

## 2012-05-28 DIAGNOSIS — E119 Type 2 diabetes mellitus without complications: Secondary | ICD-10-CM

## 2012-05-28 DIAGNOSIS — E041 Nontoxic single thyroid nodule: Secondary | ICD-10-CM

## 2012-05-28 DIAGNOSIS — E291 Testicular hypofunction: Secondary | ICD-10-CM

## 2012-05-28 NOTE — Progress Notes (Signed)
Diabetes:  Using medications without difficulties:yes Hypoglycemic episodes:no Hyperglycemic episodes: only with dietary indiscretion or after steroid injection in knee Feet problems: see below Blood Sugars averaging: 110-160 Due for labs  H/o low T on replacement and due for f/u T level and PSA.    Meds, vitals, and allergies reviewed.   ROS: See HPI.  Otherwise negative.    GEN: nad, alert and oriented HEENT: mucous membranes moist NECK: supple w/o LA CV: rrr. PULM: ctab, no inc wob ABD: soft, +bs EXT: no edema SKIN: no acute rash  Diabetic foot exam: Normal inspection No skin breakdown No calluses  1+ DP pulses Slight dec in sensation to light touch and monofilament on BLE Nails thickened

## 2012-05-28 NOTE — Assessment & Plan Note (Signed)
Hard copy written for labs- A1c.  Awaiting other renal labs from renal clinic.  Flu shot declined.  D/w pt about diet adherence.

## 2012-05-28 NOTE — Assessment & Plan Note (Signed)
Hard copy written for labs- T and PSA.  Awaiting other labs recently done by renal clinic.

## 2012-05-28 NOTE — Patient Instructions (Addendum)
Take care.  Keep working on [employer].  We'll contact you with your lab report.

## 2012-06-08 ENCOUNTER — Encounter: Payer: Self-pay | Admitting: Family Medicine

## 2012-06-11 ENCOUNTER — Encounter: Payer: Self-pay | Admitting: Family Medicine

## 2012-06-11 LAB — TESTOSTERONE
PSA: 0.6
Testosterone: 517

## 2012-06-23 ENCOUNTER — Ambulatory Visit (INDEPENDENT_AMBULATORY_CARE_PROVIDER_SITE_OTHER): Payer: Medicare Other | Admitting: *Deleted

## 2012-06-23 DIAGNOSIS — R7989 Other specified abnormal findings of blood chemistry: Secondary | ICD-10-CM

## 2012-06-23 DIAGNOSIS — E291 Testicular hypofunction: Secondary | ICD-10-CM

## 2012-06-23 MED ORDER — TESTOSTERONE CYPIONATE 200 MG/ML IM SOLN
200.0000 mg | Freq: Once | INTRAMUSCULAR | Status: AC
  Administered 2012-06-23: 200 mg via INTRAMUSCULAR

## 2012-07-29 ENCOUNTER — Ambulatory Visit: Payer: Medicare Other

## 2012-08-04 ENCOUNTER — Encounter: Payer: Self-pay | Admitting: Cardiology

## 2012-08-04 ENCOUNTER — Ambulatory Visit (INDEPENDENT_AMBULATORY_CARE_PROVIDER_SITE_OTHER): Payer: Medicare Other | Admitting: Cardiology

## 2012-08-04 ENCOUNTER — Ambulatory Visit (INDEPENDENT_AMBULATORY_CARE_PROVIDER_SITE_OTHER): Payer: Medicare Other | Admitting: *Deleted

## 2012-08-04 VITALS — BP 130/70 | HR 60 | Ht 73.0 in | Wt 224.0 lb

## 2012-08-04 DIAGNOSIS — I2510 Atherosclerotic heart disease of native coronary artery without angina pectoris: Secondary | ICD-10-CM

## 2012-08-04 DIAGNOSIS — I10 Essential (primary) hypertension: Secondary | ICD-10-CM

## 2012-08-04 DIAGNOSIS — R7989 Other specified abnormal findings of blood chemistry: Secondary | ICD-10-CM

## 2012-08-04 DIAGNOSIS — E291 Testicular hypofunction: Secondary | ICD-10-CM

## 2012-08-04 DIAGNOSIS — I5022 Chronic systolic (congestive) heart failure: Secondary | ICD-10-CM

## 2012-08-04 MED ORDER — TESTOSTERONE CYPIONATE 200 MG/ML IM SOLN: 200.0000 mg | Freq: Once | INTRAMUSCULAR | Status: DC

## 2012-08-04 MED ORDER — TESTOSTERONE CYPIONATE 200 MG/ML IM SOLN
200.0000 mg | Freq: Once | INTRAMUSCULAR | Status: AC
  Administered 2012-08-04: 200 mg via INTRAMUSCULAR

## 2012-08-04 NOTE — Patient Instructions (Addendum)
The current medical regimen is effective;  continue present plan and medications.  Follow up in 6 months with Dr Hochrein.  You will receive a letter in the mail 2 months before you are due.  Please call us when you receive this letter to schedule your follow up appointment.  

## 2012-08-04 NOTE — Progress Notes (Signed)
HPI The patient presents for followup of his cardiomyopathy.  Since I last saw him he has done well.  He has a little less energy than previous. However, he's not having any new chest pressure, neck or arm discomfort. He's not having any new palpitations, presyncope or syncope. He has no weight gain or edema. He's having no PND or orthopnea.   Allergies  Allergen Reactions  . Celecoxib     REACTION: myalgia: abdomenal pain  . Nsaids     REACTION: renal disease  . Rosuvastatin     REACTION: myalgia: musle pain    Current Outpatient Prescriptions  Medication Sig Dispense Refill  . aspirin 325 MG EC tablet Take 325 mg by mouth 2 (two) times daily.      . B-D UF III MINI PEN NEEDLES 31G X 5 MM MISC USE AS DIRECTED  100 each  3  . carvedilol (COREG) 12.5 MG tablet Take 1 tablet (12.5 mg total) by mouth 2 (two) times daily.  180 tablet  3  . cholecalciferol (VITAMIN D) 1000 UNITS tablet Take 2,000 Units by mouth daily.      . CVS Alcohol Swabs PADS USE AS NEEDED  3 each  3  . GLUCOSAMINE CHONDROITIN COMPLX PO Take 1 capsule by mouth 2 (two) times daily.       [name] glucose blood (BAYER CONTOUR TEST) test strip Check blood sugar daily  100 each  3  . insulin glargine (LANTUS SOLOSTAR) 100 UNIT/ML injection Inject 30 Units into the skin at bedtime.       [name] lisinopril (PRINIVIL,ZESTRIL) 2.5 MG tablet TAKE 1 TABLET BY MOUTH DAILY  30 tablet  7  . nitroGLYCERIN (NITROSTAT) 0.4 MG SL tablet Place 1 tablet (0.4 mg total) under the tongue every 5 (five) minutes as needed.  25 tablet  6  . Nutritional Supplements (GLUCERNA SHAKE PO) Take 4 oz by mouth daily after breakfast.        . pravastatin (PRAVACHOL) 40 MG tablet TAKE 1 TABLET BY MOUTH AT BEDTIME  30 tablet  9  . ranitidine (ZANTAC) 75 MG tablet Take 75 mg by mouth as needed.        . testosterone cypionate (DEPOTESTOTERONE CYPIONATE) 200 MG/ML injection Inject 1.25 mLs (250 mg total) into the muscle every 28 (twenty-eight) days.  10 mL  0   No  current facility-administered medications for this visit.    Past Medical History  Diagnosis Date  . HTN (hypertension) (06/17/1981)  . Diverticula of colon (06/18/1983)  . Renal insufficiency   . Hyperlipidemia 06/17/1993)  . Diabetes mellitus  (06/17/1997)  . PUD (peptic ulcer disease) 1960's  . Pancreatitis 01/24 - 07/13/08    ABD CT  fatty liver early pancreatitis  07/12/2008  Pelvic CT Nml 07/12/2008  . Cardiomyopathy     EF 25% Ant Akinesis Global Hypo 3/2-08/18/08  . Thyroid nodule     per Dr. Luisa Hart  . Renal disease     per Dr. Allena Katz  . Hypogonadism male     Past Surgical History  Procedure Laterality Date  . Colonoscopy    . Cardiac catheterization       Severe 3-vessel coronary artery disease.  Ischemic   cardiomyopathy.   . Nephrectomy  01/09/09    Clear Cell Renal Cell Carcinoma 01/09/2009  . Cardiac defibrillator placement      ICD-Medtronic .  [id number]  . Laparotomy       small mid 80's (laparotomy), 09/12/2000 (no surgery), 10/20/2008 (no  surgery)  . Lesion removal      RLE 11/14/2006 (Dr Lindie Spruce)   . Ct of abdomen  06/1997    Fatty process unchanged - myelolipoma adrenal unchangd  . Ct of abdomen  02/99    Adrenal mass unchanged, no further follow up required  . Sbo  08/2000 no surgery   10/20/08 no surgery    Mid 80's laparotomy  . Esophagogastroduodenoscopy  07/12/2008    Schatzki Ring 2 cm. HH (Dr. Arlyce Dice)  . Abdominal US  07/12/08    Fatty infiltrate liver complex cyst in lower pole left kidney  . Left renal ca  2010    s/p partial nephrectomy per Dr. Laverle Patter  . Icd/ crt (lv lead disabled)      Medtronic [id number]  . Coronary artery bypass graft  09/05/08     Mediastinotomy, extracorporeal circulation,   coronary artery bypass graft surgery x4 using a left internal mammary  artery graft to the left anterior descending coronary artery, with a  saphenous vein graft to the first diagonal branch of the LAD, a   saphenous vein graft to the obtuse marginal branch  of the left   circumflex coronary artery,  Dr. Laneta Simmers  . Cholecystectomy  1970's    ROS:  Evolving neuropathy. Otherwise as stated in the HPI and negative for all other systems.  PHYSICAL EXAM BP 130/70  Pulse 60  Ht 6\' 1"  (1.854 m)  Wt 224 lb (101.606 kg)  BMI 29.56 kg/m2 GENERAL:  Well appearing NECK:  No jugular venous distention, waveform within normal limits, carotid upstroke brisk and symmetric, no bruits, no thyromegaly LYMPHATICS:  No cervical, inguinal adenopathy LUNGS:  Clear to auscultation bilaterally BACK:  No CVA tenderness CHEST:  Well healed sternotomy scar, ICD pocket intact HEART:  PMI not displaced or sustained,S1 and S2 within normal limits, no S3, no S4, no clicks, no rubs, no murmurs ABD:  Flat, positive bowel sounds normal in frequency in pitch, no bruits, no rebound, no guarding, no midline pulsatile mass, no hepatomegaly, no splenomegaly, small umbilical hernia EXT:  2 plus pulses throughout, mild edema, no cyanosis no clubbing  EKG:  Sinus rhythm with ventricular pacing.  Rate 60. 08/04/2012  ASSESSMENT AND PLAN  CAD (coronary artery disease) -  He has no new symptoms since a stress perfusion study last year. No change in therapy is indicated.  CHRONIC SYSTOLIC HEART FAILURE -  He seems to be euvolemic today and he will continue the meds as listed. He has had trouble with med titration in the past so we will continue the meds as listed.  HYPERTENSION -  This is being managed in the context of treating his CHF

## 2012-08-10 ENCOUNTER — Telehealth: Payer: Self-pay | Admitting: Cardiology

## 2012-08-10 NOTE — Telephone Encounter (Signed)
States last night about 12 MN he had an episode of feeling sweaty with hot flashes and achyness.  Reports having some chest pain into both arms but no n/v, no SOB.  He had a lot of gas "both ways"  He got up and took 2 SL Ntg which caused the chest discomfort to resolve.  He did not take his VS at the time.  He reports going back to bed and sleeping though it took him awhile to go back to sleep.  This AM his BP was "OK"  HR was 64 and BS 137.  He reports feeling fine with his only complain being having gas "both ways"  Pt instructed to continue to monitor for s/s.  Asked to take VS if the discomfort returns and to call the office with any problems.  He has an appointment tomorrow in the office and will follow up then.

## 2012-08-10 NOTE — Telephone Encounter (Signed)
Pt calling re episode last night, pls call

## 2012-08-11 ENCOUNTER — Ambulatory Visit (INDEPENDENT_AMBULATORY_CARE_PROVIDER_SITE_OTHER): Payer: Medicare Other | Admitting: Internal Medicine

## 2012-08-11 ENCOUNTER — Encounter: Payer: Self-pay | Admitting: Internal Medicine

## 2012-08-11 VITALS — BP 134/76 | HR 77 | Ht 73.0 in | Wt 223.0 lb

## 2012-08-11 DIAGNOSIS — Z95810 Presence of automatic (implantable) cardiac defibrillator: Secondary | ICD-10-CM

## 2012-08-11 DIAGNOSIS — I2510 Atherosclerotic heart disease of native coronary artery without angina pectoris: Secondary | ICD-10-CM

## 2012-08-11 DIAGNOSIS — I5022 Chronic systolic (congestive) heart failure: Secondary | ICD-10-CM

## 2012-08-11 LAB — ICD DEVICE OBSERVATION
AL AMPLITUDE: 3.8 mv
AL THRESHOLD: 1 V
BATTERY VOLTAGE: 3.069 V
FVT: 0
LV LEAD IMPEDENCE ICD: 703 Ohm
LV LEAD THRESHOLD: 0.5 V
RV LEAD AMPLITUDE: 8.1 mv
RV LEAD IMPEDENCE ICD: 380 Ohm
RV LEAD THRESHOLD: 1.375 V
TOT-0006: 20111025000000
TZAT-0002ATACH: NEGATIVE
TZAT-0002ATACH: NEGATIVE
TZAT-0002ATACH: NEGATIVE
TZAT-0002FASTVT: NEGATIVE
TZAT-0004SLOWVT: 8
TZAT-0011SLOWVT: 10 ms
TZAT-0012ATACH: 150 ms
TZAT-0012FASTVT: 200 ms
TZAT-0012SLOWVT: 200 ms
TZAT-0013SLOWVT: 3
TZAT-0018ATACH: NEGATIVE
TZAT-0019ATACH: 6 V
TZAT-0019ATACH: 6 V
TZAT-0019ATACH: 6 V
TZAT-0019FASTVT: 8 V
TZAT-0020ATACH: 1.5 ms
TZAT-0020ATACH: 1.5 ms
TZAT-0020ATACH: 1.5 ms
TZAT-0020SLOWVT: 1.5 ms
TZON-0003SLOWVT: 350 ms
TZON-0003VSLOWVT: 400 ms
TZON-0004SLOWVT: 36
TZON-0004VSLOWVT: 40
TZST-0001ATACH: 5
TZST-0001FASTVT: 3
TZST-0001FASTVT: 4
TZST-0001FASTVT: 6
TZST-0001SLOWVT: 3
TZST-0001SLOWVT: 4
TZST-0001SLOWVT: 5
TZST-0002ATACH: NEGATIVE
TZST-0002ATACH: NEGATIVE
TZST-0002FASTVT: NEGATIVE
TZST-0003SLOWVT: 20 J
TZST-0003SLOWVT: 30 J
TZST-0003SLOWVT: 35 J
VF: 0

## 2012-08-11 NOTE — Patient Instructions (Addendum)
Your physician wants you to follow-up in: 1 year with Dr. Taylor. You will receive a reminder letter in the mail two months in advance. If you don't receive a letter, please call our office to schedule the follow-up appointment.  

## 2012-08-13 ENCOUNTER — Encounter: Payer: Self-pay | Admitting: Internal Medicine

## 2012-08-13 NOTE — Progress Notes (Signed)
HPI [name] returns today for ICD followup. He is a very pleasant 79 year old man with chronic systolic heart failure, and ischemic cardiomyopathy, status post ICD insertion. He has chronic left bundle branch block and is status post biventricular device implantation. His heart failure symptoms are class II. He wonders whether his energy level and strength might improve with testosterone injections. Allergies  Allergen Reactions  . Celecoxib     REACTION: myalgia: abdomenal pain  . Nsaids     REACTION: renal disease  . Rosuvastatin     REACTION: myalgia: musle pain     Current Outpatient Prescriptions  Medication Sig Dispense Refill  . aspirin 325 MG EC tablet Take 325 mg by mouth 2 (two) times daily.      . B-D UF III MINI PEN NEEDLES 31G X 5 MM MISC USE AS DIRECTED  100 each  3  . carvedilol (COREG) 12.5 MG tablet Take 1 tablet (12.5 mg total) by mouth 2 (two) times daily.  180 tablet  3  . cholecalciferol (VITAMIN D) 1000 UNITS tablet Take 2,000 Units by mouth daily.      . CVS Alcohol Swabs PADS USE AS NEEDED  3 each  3  . GLUCOSAMINE CHONDROITIN COMPLX PO Take 1 capsule by mouth 2 (two) times daily.       [name] glucose blood (BAYER CONTOUR TEST) test strip Check blood sugar daily  100 each  3  . insulin glargine (LANTUS SOLOSTAR) 100 UNIT/ML injection Inject 30 Units into the skin at bedtime.       [name] lisinopril (PRINIVIL,ZESTRIL) 2.5 MG tablet TAKE 1 TABLET BY MOUTH DAILY  30 tablet  7  . nitroGLYCERIN (NITROSTAT) 0.4 MG SL tablet Place 1 tablet (0.4 mg total) under the tongue every 5 (five) minutes as needed.  25 tablet  6  . Nutritional Supplements (GLUCERNA SHAKE PO) Take 4 oz by mouth daily after breakfast.        . pravastatin (PRAVACHOL) 40 MG tablet TAKE 1 TABLET BY MOUTH AT BEDTIME  30 tablet  9  . ranitidine (ZANTAC) 75 MG tablet Take 75 mg by mouth as needed.         No current facility-administered medications for this visit.     Past Medical History  Diagnosis  Date  . HTN (hypertension) (06/17/1981)  . Diverticula of colon (06/18/1983)  . Renal insufficiency   . Hyperlipidemia 06/17/1993)  . Diabetes mellitus  (06/17/1997)  . PUD (peptic ulcer disease) 1960's  . Pancreatitis 01/24 - 07/13/08    ABD CT  fatty liver early pancreatitis  07/12/2008  Pelvic CT Nml 07/12/2008  . Cardiomyopathy     EF 25% Ant Akinesis Global Hypo 3/2-08/18/08  . Thyroid nodule     per Dr. Luisa Hart  . Renal disease     per Dr. Allena Katz  . Hypogonadism male     ROS:   All systems reviewed and negative except as noted in the HPI.   Past Surgical History  Procedure Laterality Date  . Colonoscopy    . Cardiac catheterization       Severe 3-vessel coronary artery disease.  Ischemic   cardiomyopathy.   . Nephrectomy  01/09/09    Clear Cell Renal Cell Carcinoma 01/09/2009  . Cardiac defibrillator placement      ICD-Medtronic .  [id number]  . Laparotomy       small mid 80's (laparotomy), 09/12/2000 (no surgery), 10/20/2008 (no surgery)  . Lesion removal      RLE 11/14/2006 (Dr  Wyatt)   . Ct of abdomen  06/1997    Fatty process unchanged - myelolipoma adrenal unchangd  . Ct of abdomen  02/99    Adrenal mass unchanged, no further follow up required  . Sbo  08/2000 no surgery   10/20/08 no surgery    Mid 80's laparotomy  . Esophagogastroduodenoscopy  07/12/2008    Schatzki Ring 2 cm. HH (Dr. Arlyce Dice)  . Abdominal US  07/12/08    Fatty infiltrate liver complex cyst in lower pole left kidney  . Left renal ca  2010    s/p partial nephrectomy per Dr. Laverle Patter  . Icd/ crt (lv lead disabled)      Medtronic [id number]  . Coronary artery bypass graft  09/05/08     Mediastinotomy, extracorporeal circulation,   coronary artery bypass graft surgery x4 using a left internal mammary  artery graft to the left anterior descending coronary artery, with a  saphenous vein graft to the first diagonal branch of the LAD, a   saphenous vein graft to the obtuse marginal branch of the left    circumflex coronary artery,  Dr. Laneta Simmers  . Cholecystectomy  1970's     Family History  Problem Relation Age of Onset  . Angina Father   . Hypertension Father   . Heart disease Father     MI, angina  . Breast cancer Mother   . Cancer Mother     Breast  . Heart disease Mother     Valve disease  . Cancer Brother     Brain tumor     History   Social History  . Marital Status: Married    Spouse Name: N/A    Number of Children: 2  . Years of Education: N/A   Occupational History  . Retired from [employer]    Social History Main Topics  . Smoking status: Former Smoker    Quit date: 06/17/1938  . Smokeless tobacco: Never Used  . Alcohol Use: Yes     Comment: Rare  . Drug Use: No  . Sexually Active: Not on file   Other Topics Concern  . Not on file   Social History Narrative    The patient is retired.  He has been married since age        of 71.  They have 2 children.  They have grandchildren and great        grandchildren.       BP 134/76  Pulse 77  Ht 6\' 1"  (1.854 m)  Wt 223 lb (101.152 kg)  BMI 29.43 kg/m2  SpO2 98%  Physical Exam:  Well appearing elderly man,NAD HEENT: Unremarkable Neck:  7 cm JVD, no thyromegally Lungs:  Clear with no wheezes, rales, or rhonchi. HEART:  Regular rate rhythm, no murmurs, no rubs, no clicks Abd:  soft, positive bowel sounds, no organomegally, no rebound, no guarding Ext:  2 plus pulses, no edema, no cyanosis, no clubbing Skin:  No rashes no nodules Neuro:  CN II through XII intact, motor grossly intact   DEVICE  Normal device function.  See PaceArt for details.   Assess/Plan:

## 2012-08-13 NOTE — Assessment & Plan Note (Signed)
His heart failure symptoms are class II. I've not change his medications today. I've asked the patient to followup with his primary doctor regarding the feasibility and utility of testosterone injections. I've asked him to maintain a low-sodium diet and to increase his physical activity.

## 2012-08-13 NOTE — Assessment & Plan Note (Signed)
His Medtronic biventricular ICD is working normally. We'll plan to recheck in several months. 

## 2012-08-19 ENCOUNTER — Other Ambulatory Visit (INDEPENDENT_AMBULATORY_CARE_PROVIDER_SITE_OTHER): Payer: Medicare Other

## 2012-08-19 DIAGNOSIS — E119 Type 2 diabetes mellitus without complications: Secondary | ICD-10-CM

## 2012-08-19 LAB — HEMOGLOBIN A1C: Hgb A1c MFr Bld: 9.6 % — ABNORMAL HIGH (ref 4.6–6.5)

## 2012-08-25 ENCOUNTER — Encounter: Payer: Self-pay | Admitting: Family Medicine

## 2012-08-25 ENCOUNTER — Ambulatory Visit (INDEPENDENT_AMBULATORY_CARE_PROVIDER_SITE_OTHER): Payer: Medicare Other | Admitting: Family Medicine

## 2012-08-25 VITALS — BP 126/60 | HR 72 | Temp 97.8°F

## 2012-08-25 DIAGNOSIS — N058 Unspecified nephritic syndrome with other morphologic changes: Secondary | ICD-10-CM

## 2012-08-25 DIAGNOSIS — N529 Male erectile dysfunction, unspecified: Secondary | ICD-10-CM

## 2012-08-25 DIAGNOSIS — E1165 Type 2 diabetes mellitus with hyperglycemia: Secondary | ICD-10-CM

## 2012-08-25 DIAGNOSIS — E1129 Type 2 diabetes mellitus with other diabetic kidney complication: Secondary | ICD-10-CM

## 2012-08-25 DIAGNOSIS — E119 Type 2 diabetes mellitus without complications: Secondary | ICD-10-CM

## 2012-08-25 NOTE — Patient Instructions (Addendum)
Recheck A1c and lipids (fasting) in about 3 months with a visit with Para March a few days later.  Ask Dr. Allena Katz about his opinion on viagra and kidney function. We'll talk about after that.   Take care.  Keep working on your diet in the meantime.

## 2012-08-25 NOTE — Progress Notes (Signed)
Diabetes:  Using medications without difficulties:yes Hypoglycemic episodes:no Hyperglycemic episodes: rare on checks at home Feet problems: Blood Sugars averaging: usually 150 or less.   If he "cheats" his sugar runs up and he's been "cheating" more. Discussed.   He gave up some sweet/high K fruits.  He's trying to cut back on sweet tea.   He's been working on his diet over the last 3-4 weeks and his sugars have trended down more recently.   A1c 9.6.  He's generally eating late breakfast and an early dinner.  He has some reading materials and he's going to read up on diabetes.    He was asking about viagra.  I would like to know about his renal labs for dosing and he has renal f/u pending.    Meds, vitals, and allergies reviewed.   ROS: See HPI.  Otherwise negative.    GEN: nad, alert and oriented HEENT: mucous membranes moist NECK: supple w/o LA CV: rrr. PULM: ctab, no inc wob ABD: soft, +bs EXT: no edema SKIN: no acute rash

## 2012-08-26 DIAGNOSIS — N529 Male erectile dysfunction, unspecified: Secondary | ICD-10-CM | POA: Insufficient documentation

## 2012-08-26 NOTE — Assessment & Plan Note (Signed)
If he "cheats" his sugar runs up and he's been "cheating" more. Discussed.   He gave up some sweet/high K fruits.  He's trying to cut back on sweet tea.   He's been working on his diet over the last 3-4 weeks and his sugars have trended down more recently.   A1c 9.6.  He's generally eating late breakfast and an early dinner.  He has some reading materials and he's going to read up on diabetes.   He'll work on diet, continue current meds and recheck in 3 months.

## 2012-08-26 NOTE — Assessment & Plan Note (Signed)
I'll await the renal labs.  He agrees.

## 2012-09-22 ENCOUNTER — Ambulatory Visit (INDEPENDENT_AMBULATORY_CARE_PROVIDER_SITE_OTHER): Payer: Medicare Other | Admitting: *Deleted

## 2012-09-22 DIAGNOSIS — R7989 Other specified abnormal findings of blood chemistry: Secondary | ICD-10-CM

## 2012-09-22 DIAGNOSIS — E291 Testicular hypofunction: Secondary | ICD-10-CM

## 2012-09-22 MED ORDER — TESTOSTERONE CYPIONATE 200 MG/ML IM SOLN
200.0000 mg | Freq: Once | INTRAMUSCULAR | Status: AC
  Administered 2012-09-22: 200 mg via INTRAMUSCULAR

## 2012-10-04 ENCOUNTER — Other Ambulatory Visit: Payer: Self-pay | Admitting: Family Medicine

## 2012-10-26 ENCOUNTER — Other Ambulatory Visit: Payer: Self-pay | Admitting: Family Medicine

## 2012-10-27 ENCOUNTER — Ambulatory Visit (INDEPENDENT_AMBULATORY_CARE_PROVIDER_SITE_OTHER): Payer: Medicare Other | Admitting: *Deleted

## 2012-10-27 DIAGNOSIS — E291 Testicular hypofunction: Secondary | ICD-10-CM

## 2012-10-27 DIAGNOSIS — R7989 Other specified abnormal findings of blood chemistry: Secondary | ICD-10-CM

## 2012-10-27 MED ORDER — TESTOSTERONE CYPIONATE 200 MG/ML IM SOLN
200.0000 mg | Freq: Once | INTRAMUSCULAR | Status: AC
  Administered 2012-10-27: 200 mg via INTRAMUSCULAR

## 2012-11-02 ENCOUNTER — Other Ambulatory Visit: Payer: Self-pay | Admitting: Family Medicine

## 2012-11-03 ENCOUNTER — Other Ambulatory Visit: Payer: Self-pay | Admitting: *Deleted

## 2012-11-10 ENCOUNTER — Encounter: Payer: Self-pay | Admitting: Internal Medicine

## 2012-11-10 ENCOUNTER — Ambulatory Visit (INDEPENDENT_AMBULATORY_CARE_PROVIDER_SITE_OTHER): Payer: Medicare Other | Admitting: *Deleted

## 2012-11-10 DIAGNOSIS — I428 Other cardiomyopathies: Secondary | ICD-10-CM

## 2012-11-10 DIAGNOSIS — Z95810 Presence of automatic (implantable) cardiac defibrillator: Secondary | ICD-10-CM

## 2012-11-10 DIAGNOSIS — I5022 Chronic systolic (congestive) heart failure: Secondary | ICD-10-CM

## 2012-11-16 ENCOUNTER — Ambulatory Visit: Payer: Medicare Other | Admitting: Family Medicine

## 2012-11-16 LAB — REMOTE ICD DEVICE
AL AMPLITUDE: 2.4 mv
AL IMPEDENCE ICD: 494 Ohm
AL THRESHOLD: 1.625 V
ATRIAL PACING ICD: 0.1 pct
BAMS-0001: 170 {beats}/min
BATTERY VOLTAGE: 3.0418 V
CHARGE TIME: 10.019 s
FVT: 0
LV LEAD IMPEDENCE ICD: 627 Ohm
PACEART VT: 0
RV LEAD AMPLITUDE: 8.1 mv
RV LEAD IMPEDENCE ICD: 342 Ohm
RV LEAD THRESHOLD: 1.375 V
TOT-0001: 1
TOT-0002: 0
TOT-0006: 20111025000000
TZAT-0001ATACH: 1
TZAT-0001ATACH: 2
TZAT-0001ATACH: 3
TZAT-0001FASTVT: 1
TZAT-0001SLOWVT: 1
TZAT-0002ATACH: NEGATIVE
TZAT-0002ATACH: NEGATIVE
TZAT-0002ATACH: NEGATIVE
TZAT-0002FASTVT: NEGATIVE
TZAT-0004SLOWVT: 8
TZAT-0005SLOWVT: 88 pct
TZAT-0011SLOWVT: 10 ms
TZAT-0012ATACH: 150 ms
TZAT-0012ATACH: 150 ms
TZAT-0012ATACH: 150 ms
TZAT-0012FASTVT: 200 ms
TZAT-0012SLOWVT: 200 ms
TZAT-0013SLOWVT: 3
TZAT-0018ATACH: NEGATIVE
TZAT-0018ATACH: NEGATIVE
TZAT-0018ATACH: NEGATIVE
TZAT-0018FASTVT: NEGATIVE
TZAT-0018SLOWVT: NEGATIVE
TZAT-0019ATACH: 6 V
TZAT-0019ATACH: 6 V
TZAT-0019ATACH: 6 V
TZAT-0019FASTVT: 8 V
TZAT-0019SLOWVT: 8 V
TZAT-0020ATACH: 1.5 ms
TZAT-0020ATACH: 1.5 ms
TZAT-0020ATACH: 1.5 ms
TZAT-0020FASTVT: 1.5 ms
TZAT-0020SLOWVT: 1.5 ms
TZON-0003ATACH: 350 ms
TZON-0003SLOWVT: 350 ms
TZON-0003VSLOWVT: 400 ms
TZON-0004SLOWVT: 36
TZON-0004VSLOWVT: 40
TZON-0005SLOWVT: 12
TZST-0001ATACH: 4
TZST-0001ATACH: 5
TZST-0001ATACH: 6
TZST-0001FASTVT: 2
TZST-0001FASTVT: 3
TZST-0001FASTVT: 4
TZST-0001FASTVT: 5
TZST-0001FASTVT: 6
TZST-0001SLOWVT: 2
TZST-0001SLOWVT: 3
TZST-0001SLOWVT: 4
TZST-0001SLOWVT: 5
TZST-0001SLOWVT: 6
TZST-0002ATACH: NEGATIVE
TZST-0002ATACH: NEGATIVE
TZST-0002ATACH: NEGATIVE
TZST-0002FASTVT: NEGATIVE
TZST-0002FASTVT: NEGATIVE
TZST-0002FASTVT: NEGATIVE
TZST-0002FASTVT: NEGATIVE
TZST-0002FASTVT: NEGATIVE
TZST-0003SLOWVT: 20 J
TZST-0003SLOWVT: 30 J
TZST-0003SLOWVT: 35 J
TZST-0003SLOWVT: 35 J
TZST-0003SLOWVT: 35 J
VENTRICULAR PACING ICD: 99.99 pct
VF: 0

## 2012-11-30 ENCOUNTER — Other Ambulatory Visit: Payer: Self-pay | Admitting: Family Medicine

## 2012-11-30 ENCOUNTER — Other Ambulatory Visit (INDEPENDENT_AMBULATORY_CARE_PROVIDER_SITE_OTHER): Payer: Medicare Other

## 2012-11-30 DIAGNOSIS — E1165 Type 2 diabetes mellitus with hyperglycemia: Secondary | ICD-10-CM

## 2012-11-30 DIAGNOSIS — N058 Unspecified nephritic syndrome with other morphologic changes: Secondary | ICD-10-CM

## 2012-11-30 DIAGNOSIS — E119 Type 2 diabetes mellitus without complications: Secondary | ICD-10-CM

## 2012-11-30 LAB — LDL CHOLESTEROL, DIRECT: Direct LDL: 85 mg/dL

## 2012-11-30 LAB — LIPID PANEL
HDL: 38.4 mg/dL — ABNORMAL LOW (ref >39.00)
VLDL: 49.4 mg/dL — ABNORMAL HIGH (ref 0.0–40.0)

## 2012-12-03 ENCOUNTER — Encounter: Payer: Self-pay | Admitting: Family Medicine

## 2012-12-03 ENCOUNTER — Ambulatory Visit (INDEPENDENT_AMBULATORY_CARE_PROVIDER_SITE_OTHER): Payer: Medicare Other | Admitting: Family Medicine

## 2012-12-03 ENCOUNTER — Ambulatory Visit: Payer: Medicare Other | Admitting: Family Medicine

## 2012-12-03 VITALS — BP 132/68 | HR 69 | Temp 98.1°F | Wt 220.8 lb

## 2012-12-03 DIAGNOSIS — N058 Unspecified nephritic syndrome with other morphologic changes: Secondary | ICD-10-CM

## 2012-12-03 DIAGNOSIS — J069 Acute upper respiratory infection, unspecified: Secondary | ICD-10-CM

## 2012-12-03 DIAGNOSIS — E1165 Type 2 diabetes mellitus with hyperglycemia: Secondary | ICD-10-CM

## 2012-12-03 MED ORDER — INSULIN GLARGINE 100 UNIT/ML SOLOSTAR PEN: PEN_INJECTOR | SUBCUTANEOUS | Status: DC

## 2012-12-03 NOTE — Patient Instructions (Addendum)
Recheck A1c in fall of 2014 before a visit.  Keep working on your sugar intake.  Drink plenty of fluids and gargle with warm salt water for your throat.  This should gradually improve.  Take care.  Let us know if you have other concerns.

## 2012-12-03 NOTE — Progress Notes (Signed)
No fever, but a cough since yesterday.  Some diarrhea today, 3 episodes, watery. No sputum.  Minimal rhinorrhea.  Mild ST. Not SOB. Facial pressure noted. Wife was recently sick.    DM2.  His A1c was some better. He's working on diet.  We talked about diet and possible inc by 1 unit per day.  Labs discussed.  Compliant with meds.  Meds, vitals, and allergies reviewed.   ROS: See HPI.  Otherwise, noncontributory.  GEN: nad, alert and oriented HEENT: mucous membranes moist, tm w/o erythema, nasal exam w/o erythema, clear discharge noted,  OP with cobblestoning NECK: supple w/o LA CV: rrr.   PULM: ctab, no inc wob EXT: no edema SKIN: no acute rash

## 2012-12-04 DIAGNOSIS — J069 Acute upper respiratory infection, unspecified: Secondary | ICD-10-CM | POA: Insufficient documentation

## 2012-12-04 NOTE — Assessment & Plan Note (Signed)
A1c improved, not controlled yet.  Is working on diet.  Continue diet, no change in meds.  Recheck later in 2014.  He agrees.

## 2012-12-04 NOTE — Assessment & Plan Note (Signed)
Nontoxic, likely viral, supportive tx.  Lungs ctab.  Okay for outpatient f/u.

## 2012-12-07 ENCOUNTER — Other Ambulatory Visit: Payer: Medicare Other

## 2012-12-16 ENCOUNTER — Ambulatory Visit (INDEPENDENT_AMBULATORY_CARE_PROVIDER_SITE_OTHER): Payer: Medicare Other | Admitting: Family Medicine

## 2012-12-16 ENCOUNTER — Encounter: Payer: Self-pay | Admitting: Family Medicine

## 2012-12-16 VITALS — BP 118/68 | HR 76 | Temp 97.6°F | Wt 221.0 lb

## 2012-12-16 DIAGNOSIS — E291 Testicular hypofunction: Secondary | ICD-10-CM

## 2012-12-16 DIAGNOSIS — E1165 Type 2 diabetes mellitus with hyperglycemia: Secondary | ICD-10-CM

## 2012-12-16 DIAGNOSIS — K5732 Diverticulitis of large intestine without perforation or abscess without bleeding: Secondary | ICD-10-CM

## 2012-12-16 DIAGNOSIS — E1129 Type 2 diabetes mellitus with other diabetic kidney complication: Secondary | ICD-10-CM

## 2012-12-16 DIAGNOSIS — N058 Unspecified nephritic syndrome with other morphologic changes: Secondary | ICD-10-CM

## 2012-12-16 MED ORDER — TESTOSTERONE CYPIONATE 200 MG/ML IM SOLN
200.0000 mg | Freq: Once | INTRAMUSCULAR | Status: AC
  Administered 2012-12-16: 200 mg via INTRAMUSCULAR

## 2012-12-16 MED ORDER — AMOXICILLIN-POT CLAVULANATE 500-125 MG PO TABS: 1.0000 | ORAL_TABLET | Freq: Two times a day (BID) | ORAL | Status: DC

## 2012-12-16 NOTE — Progress Notes (Signed)
Last A1c 8.8.  Now on 25 units of lantus.  Discussed about Dm2 options, esp FK:CLEX.  Cutting back sweets, sugar 140-150.  Lower abd pain, noted in early AM, better later in the day after a BM.  Had been going on for about 2 weeks.  He recently had frequently diarrhea, ~2 days ago- 7 episodes.  No BM since then.  No food changes. The pain was less this AM.  No dysuria.  No fevers.  No vomiting. No nausea. He had some intermittent constipation before the diarrhea started, some days looser than others, had been that way since his cholecystectomy.  No blood in stool.  Noted history from 3/13- prev note reviewed.    Due for testosterone injection.   Meds, vitals, and allergies reviewed.   ROS: See HPI.  Otherwise, noncontributory.  nad ncat Mmm rrr ctab abd soft, not ttp except for minimal tenderness in LLQ, no rebound, normal BS Ext w/o cyanosis

## 2012-12-16 NOTE — Assessment & Plan Note (Signed)
H/o prev, we had avoided contrast study due to renal function.   He could have have constipation causing this, improved after diarrhea (unclear if transient viral source).  Nontoxic.  Would hold abx for now.  If progressive LLQ pain, then start abx.  We have done this prev with relief of pain.   If profoundly worse, to ER.  He agrees.

## 2012-12-16 NOTE — Assessment & Plan Note (Signed)
Again stressed diet for DM2 control.

## 2012-12-16 NOTE — Patient Instructions (Signed)
Clear liquids for now, if the left lower quadrant pain gets slightly worse then start the antibiotics.  If suddenly much worse, then to the ER.  Take care.  Glad to see you.  Would recheck labs before a visit this fall. Keep working on [employer].

## 2012-12-22 ENCOUNTER — Other Ambulatory Visit: Payer: Self-pay | Admitting: Family Medicine

## 2012-12-24 ENCOUNTER — Other Ambulatory Visit: Payer: Self-pay

## 2012-12-25 ENCOUNTER — Encounter: Payer: Self-pay | Admitting: *Deleted

## 2012-12-29 ENCOUNTER — Other Ambulatory Visit: Payer: Self-pay | Admitting: Cardiology

## 2013-01-01 ENCOUNTER — Other Ambulatory Visit: Payer: Self-pay | Admitting: Cardiology

## 2013-01-20 ENCOUNTER — Encounter: Payer: Self-pay | Admitting: Cardiology

## 2013-02-02 ENCOUNTER — Ambulatory Visit (INDEPENDENT_AMBULATORY_CARE_PROVIDER_SITE_OTHER): Payer: Medicare Other | Admitting: Cardiology

## 2013-02-02 ENCOUNTER — Encounter: Payer: Self-pay | Admitting: Cardiology

## 2013-02-02 VITALS — BP 132/74 | HR 66 | Ht 73.0 in | Wt 225.1 lb

## 2013-02-02 DIAGNOSIS — I2510 Atherosclerotic heart disease of native coronary artery without angina pectoris: Secondary | ICD-10-CM

## 2013-02-02 DIAGNOSIS — I5022 Chronic systolic (congestive) heart failure: Secondary | ICD-10-CM

## 2013-02-02 DIAGNOSIS — I10 Essential (primary) hypertension: Secondary | ICD-10-CM

## 2013-02-02 NOTE — Progress Notes (Signed)
HPI The patient presents for followup of his cardiomyopathy.  Since I last saw him he has one episode of chest painafter he probably in too much.  This was a few months ago and he did take a nitroglycerin at that time. He otherwise has not had any other symptoms.  He's had no new symptoms since his stress test last year.He's not having any new chest pressure, neck or arm discomfort. He's not having any new palpitations, presyncope or syncope. He has no weight gain or edema. He's having no PND or orthopnea. He is not particularly active being limited he says by knee pain.   Allergies  Allergen Reactions  . Celecoxib     REACTION: myalgia: abdomenal pain  . Nsaids     REACTION: renal disease  . Rosuvastatin     REACTION: myalgia: musle pain    Current Outpatient Prescriptions  Medication Sig Dispense Refill  . amoxicillin-clavulanate (AUGMENTIN) 500-125 MG per tablet Take 1 tablet (500 mg total) by mouth 2 (two) times daily.  20 tablet  0  . aspirin 325 MG EC tablet Take 325 mg by mouth 2 (two) times daily.      . B-D UF III MINI PEN NEEDLES 31G X 5 MM MISC USE AS DIRECTED  100 each  3  . carvedilol (COREG) 12.5 MG tablet TAKE 1 TABLET BY MOUTH TWICE A DAY  180 tablet  3  . cholecalciferol (VITAMIN D) 1000 UNITS tablet Take 2,000 Units by mouth daily.      . [facility] Alcohol Swabs PADS USE AS NEEDED  3 each  3  . glucose blood (BAYER CONTOUR TEST) test strip Check blood sugar daily.  Diagnosis:  250.00 insulin-dependent.  100 each  6  . insulin glargine (LANTUS SOLOSTAR) 100 UNIT/ML injection Inject 25 Units into the skin at bedtime.       [name] lisinopril (PRINIVIL,ZESTRIL) 2.5 MG tablet TAKE 1 TABLET BY MOUTH DAILY  30 tablet  7  . nitroGLYCERIN (NITROSTAT) 0.4 MG SL tablet Place 1 tablet (0.4 mg total) under the tongue every 5 (five) minutes as needed.  25 tablet  6  . Nutritional Supplements (GLUCERNA SHAKE PO) Take 4 oz by mouth daily after breakfast.        . pravastatin (PRAVACHOL) 40 MG  tablet TAKE 1 TABLET BY MOUTH AT BEDTIME  30 tablet  9  . ranitidine (ZANTAC) 75 MG tablet Take 75 mg by mouth as needed.        . testosterone cypionate (DEPOTESTOTERONE CYPIONATE) 200 MG/ML injection INJECT 1.25ML INTO THE MUSCLE EVERY 28 DAYS  10 mL  0   No current facility-administered medications for this visit.    Past Medical History  Diagnosis Date  . HTN (hypertension) (06/17/1981)  . Diverticula of colon (06/18/1983)  . Renal insufficiency   . Hyperlipidemia 06/17/1993)  . Diabetes mellitus  (06/17/1997)  . PUD (peptic ulcer disease) 1960's  . Pancreatitis 01/24 - 07/13/08    ABD CT  fatty liver early pancreatitis  07/12/2008  Pelvic CT Nml 07/12/2008  . Cardiomyopathy     EF 25% Ant Akinesis Global Hypo 3/2-08/18/08  . Thyroid nodule     per Dr. Luisa Hart  . Renal disease     per Dr. Allena Katz  . Hypogonadism male     Past Surgical History  Procedure Laterality Date  . Colonoscopy    . Cardiac catheterization       Severe 3-vessel coronary artery disease.  Ischemic   cardiomyopathy.   [name]  Nephrectomy  01/09/09    Clear Cell Renal Cell Carcinoma 01/09/2009  . Cardiac defibrillator placement      ICD-Medtronic .  [id number]  . Laparotomy       small mid 80's (laparotomy), 09/12/2000 (no surgery), 10/20/2008 (no surgery)  . Lesion removal      RLE 11/14/2006 (Dr Lindie Spruce)   . Ct of abdomen  06/1997    Fatty process unchanged - myelolipoma adrenal unchangd  . Ct of abdomen  02/99    Adrenal mass unchanged, no further follow up required  . Sbo  08/2000 no surgery   10/20/08 no surgery    Mid 80's laparotomy  . Esophagogastroduodenoscopy  07/12/2008    Schatzki Ring 2 cm. HH (Dr. Arlyce Dice)  . Abdominal US  07/12/08    Fatty infiltrate liver complex cyst in lower pole left kidney  . Left renal ca  2010    s/p partial nephrectomy per Dr. Laverle Patter  . Icd/ crt (lv lead disabled)      Medtronic [id number]  . Coronary artery bypass graft  09/05/08     Mediastinotomy, extracorporeal  circulation,   coronary artery bypass graft surgery x4 using a left internal mammary  artery graft to the left anterior descending coronary artery, with a  saphenous vein graft to the first diagonal branch of the LAD, a   saphenous vein graft to the obtuse marginal branch of the left   circumflex coronary artery,  Dr. Laneta Simmers  . Cholecystectomy  1970's    ROS:  Evolving neuropathy. Otherwise as stated in the HPI and negative for all other systems.  PHYSICAL EXAM BP 132/74  Pulse 66  Ht 6\' 1"  (1.854 m)  Wt 225 lb 1.9 oz (102.114 kg)  BMI 29.71 kg/m2 GENERAL:  Well appearing NECK:  No jugular venous distention, waveform within normal limits, carotid upstroke brisk and symmetric, no bruits, no thyromegaly LYMPHATICS:  No cervical, inguinal adenopathy LUNGS:  Clear to auscultation bilaterally BACK:  No CVA tenderness CHEST:  Well healed sternotomy scar, ICD pocket intact HEART:  PMI not displaced or sustained,S1 and S2 within normal limits, no S3, no S4, no clicks, no rubs, no murmurs ABD:  Flat, positive bowel sounds normal in frequency in pitch, no bruits, no rebound, no guarding, no midline pulsatile mass, no hepatomegaly, no splenomegaly, small umbilical hernia EXT:  2 plus pulses throughout, mild edema, no cyanosis no clubbing  EKG:  Sinus rhythm with ventricular pacing.  Rate 66. 02/02/2013  ASSESSMENT AND PLAN  CAD (coronary artery disease) -  He has no new symptoms consistently since a stress perfusion study last year. No change in therapy is indicated. I have encouraged increased activity.  CHRONIC SYSTOLIC HEART FAILURE -  He seems to be euvolemic today and he will continue the meds as listed. He has had trouble with med titration in the past so we will continue the meds as listed.  HYPERTENSION -  This is being managed in the context of treating his CHF

## 2013-02-02 NOTE — Patient Instructions (Addendum)
The current medical regimen is effective;  continue present plan and medications.  Follow up in 1 year with Dr Hochrein.  You will receive a letter in the mail 2 months before you are due.  Please call us when you receive this letter to schedule your follow up appointment.  

## 2013-02-16 ENCOUNTER — Ambulatory Visit (INDEPENDENT_AMBULATORY_CARE_PROVIDER_SITE_OTHER): Payer: Medicare Other | Admitting: *Deleted

## 2013-02-16 DIAGNOSIS — I5022 Chronic systolic (congestive) heart failure: Secondary | ICD-10-CM

## 2013-02-16 DIAGNOSIS — Z95810 Presence of automatic (implantable) cardiac defibrillator: Secondary | ICD-10-CM

## 2013-02-26 LAB — REMOTE ICD DEVICE
ATRIAL PACING ICD: 0.04 pct
BAMS-0001: 170 {beats}/min
FVT: 0
LV LEAD IMPEDENCE ICD: 741 Ohm
PACEART VT: 0
RV LEAD THRESHOLD: 1.125 V
TOT-0006: 20111025000000
TZAT-0001ATACH: 2
TZAT-0001ATACH: 3
TZAT-0002ATACH: NEGATIVE
TZAT-0002ATACH: NEGATIVE
TZAT-0005SLOWVT: 88 pct
TZAT-0011SLOWVT: 10 ms
TZAT-0012ATACH: 150 ms
TZAT-0012SLOWVT: 200 ms
TZAT-0018ATACH: NEGATIVE
TZAT-0018ATACH: NEGATIVE
TZAT-0018ATACH: NEGATIVE
TZAT-0018SLOWVT: NEGATIVE
TZAT-0019ATACH: 6 V
TZAT-0019ATACH: 6 V
TZAT-0019FASTVT: 8 V
TZAT-0019SLOWVT: 8 V
TZAT-0020ATACH: 1.5 ms
TZAT-0020FASTVT: 1.5 ms
TZON-0003SLOWVT: 350 ms
TZON-0003VSLOWVT: 400 ms
TZON-0004VSLOWVT: 40
TZON-0005SLOWVT: 12
TZST-0001ATACH: 6
TZST-0001FASTVT: 2
TZST-0001FASTVT: 3
TZST-0001SLOWVT: 3
TZST-0001SLOWVT: 5
TZST-0001SLOWVT: 6
TZST-0002ATACH: NEGATIVE
TZST-0002ATACH: NEGATIVE
TZST-0002FASTVT: NEGATIVE
TZST-0002FASTVT: NEGATIVE
TZST-0003SLOWVT: 20 J
TZST-0003SLOWVT: 35 J
TZST-0003SLOWVT: 35 J
VENTRICULAR PACING ICD: 99.98 pct
VF: 0

## 2013-03-09 ENCOUNTER — Encounter: Payer: Self-pay | Admitting: *Deleted

## 2013-03-15 ENCOUNTER — Other Ambulatory Visit: Payer: Self-pay | Admitting: Cardiology

## 2013-03-16 ENCOUNTER — Ambulatory Visit (INDEPENDENT_AMBULATORY_CARE_PROVIDER_SITE_OTHER): Payer: Medicare Other | Admitting: *Deleted

## 2013-03-16 DIAGNOSIS — E291 Testicular hypofunction: Secondary | ICD-10-CM

## 2013-03-16 DIAGNOSIS — R7989 Other specified abnormal findings of blood chemistry: Secondary | ICD-10-CM

## 2013-03-16 DIAGNOSIS — Z23 Encounter for immunization: Secondary | ICD-10-CM

## 2013-03-16 MED ORDER — TESTOSTERONE CYPIONATE 200 MG/ML IM SOLN
200.0000 mg | Freq: Once | INTRAMUSCULAR | Status: AC
  Administered 2013-03-16: 200 mg via INTRAMUSCULAR

## 2013-03-18 ENCOUNTER — Ambulatory Visit: Payer: Medicare Other

## 2013-03-23 ENCOUNTER — Encounter: Payer: Self-pay | Admitting: Internal Medicine

## 2013-04-29 ENCOUNTER — Ambulatory Visit (INDEPENDENT_AMBULATORY_CARE_PROVIDER_SITE_OTHER): Payer: Medicare Other | Admitting: Family Medicine

## 2013-04-29 ENCOUNTER — Encounter: Payer: Self-pay | Admitting: Family Medicine

## 2013-04-29 ENCOUNTER — Ambulatory Visit: Payer: Medicare Other

## 2013-04-29 VITALS — BP 140/80 | HR 72 | Temp 97.5°F | Wt 220.8 lb

## 2013-04-29 DIAGNOSIS — E1129 Type 2 diabetes mellitus with other diabetic kidney complication: Secondary | ICD-10-CM

## 2013-04-29 DIAGNOSIS — R197 Diarrhea, unspecified: Secondary | ICD-10-CM

## 2013-04-29 DIAGNOSIS — E119 Type 2 diabetes mellitus without complications: Secondary | ICD-10-CM

## 2013-04-29 DIAGNOSIS — R109 Unspecified abdominal pain: Secondary | ICD-10-CM

## 2013-04-29 DIAGNOSIS — N058 Unspecified nephritic syndrome with other morphologic changes: Secondary | ICD-10-CM

## 2013-04-29 LAB — CBC WITH DIFFERENTIAL/PLATELET
Basophils Absolute: 0 10*3/uL (ref 0.0–0.1)
Eosinophils Relative: 3.4 % (ref 0.0–5.0)
HCT: 44.3 % (ref 39.0–52.0)
Hemoglobin: 14.8 g/dL (ref 13.0–17.0)
Lymphocytes Relative: 13.2 % (ref 12.0–46.0)
Monocytes Relative: 8.6 % (ref 3.0–12.0)
Neutro Abs: 7.9 10*3/uL — ABNORMAL HIGH (ref 1.4–7.7)
Platelets: 197 10*3/uL (ref 150.0–400.0)
RDW: 13.9 % (ref 11.5–14.6)
WBC: 10.6 10*3/uL — ABNORMAL HIGH (ref 4.5–10.5)

## 2013-04-29 LAB — HEMOGLOBIN A1C: Hgb A1c MFr Bld: 11 % — ABNORMAL HIGH (ref 4.6–6.5)

## 2013-04-29 NOTE — Progress Notes (Signed)
Pre-visit discussion using our clinic review tool. No additional management support is needed unless otherwise documented below in the visit note.  

## 2013-04-29 NOTE — Patient Instructions (Signed)
Go to the lab on the way out.  We'll contact you with your lab report.  Bland diet for now, plenty of clear liquids.  If not improving, then notify me.  If profoundly worse, then to ER.  Take care.

## 2013-04-29 NOTE — Progress Notes (Signed)
Lower abd pain.  Started about 4 days ago.  3 days ago with mult episodes of diarrhea.  No diarrhea as of today.  Limited sugar intake in the meantime, but sugar was elevated in the last few days.  No blood in stool.  Watery diarrhea a few days ago.  No BM today.  No vomiting.  Some nausea.  No upper abd pain.  No fevers.  Minimal abd pain now, lower abd pain.  Pain is better after a BM.  Pain is worse from 4AM to 8 AM.  Zantac seemed to help some.    DM2- see notes on labs.   Meds, vitals, and allergies reviewed.   ROS: See HPI.  Otherwise, noncontributory.  nad ncat rrr ctab Mildly ttp in lower abd, no rebound, normal BS No edema

## 2013-04-30 ENCOUNTER — Other Ambulatory Visit: Payer: Self-pay | Admitting: Family Medicine

## 2013-04-30 NOTE — Assessment & Plan Note (Signed)
Improved recently. Would hold off on abx at this point given the improvement. Would like to avoid contrast imaging to eval for possible diverticulitis given his renal function.  He agrees.  Diverticulitis is possible and we may have to treat empirically if progressive sx.  Certainly not toxic now and okay for outpatient fu with bland diet.  D/w pt, he agrees.  No peritoneal signs on exam.

## 2013-04-30 NOTE — Assessment & Plan Note (Signed)
See notes on labs. 

## 2013-05-03 ENCOUNTER — Encounter: Payer: Self-pay | Admitting: Family Medicine

## 2013-05-03 ENCOUNTER — Ambulatory Visit (INDEPENDENT_AMBULATORY_CARE_PROVIDER_SITE_OTHER): Payer: Medicare Other | Admitting: Family Medicine

## 2013-05-03 VITALS — BP 138/66 | HR 72 | Temp 97.3°F | Wt 222.5 lb

## 2013-05-03 DIAGNOSIS — E1129 Type 2 diabetes mellitus with other diabetic kidney complication: Secondary | ICD-10-CM

## 2013-05-03 DIAGNOSIS — R197 Diarrhea, unspecified: Secondary | ICD-10-CM

## 2013-05-03 DIAGNOSIS — N058 Unspecified nephritic syndrome with other morphologic changes: Secondary | ICD-10-CM

## 2013-05-03 MED ORDER — AMOXICILLIN-POT CLAVULANATE 500-125 MG PO TABS: 1.0000 | ORAL_TABLET | Freq: Three times a day (TID) | ORAL | Status: DC

## 2013-05-03 NOTE — Assessment & Plan Note (Signed)
Advised to check his sugar and dose insulin appropriately, see instructions.  Recheck in 3 months.

## 2013-05-03 NOTE — Patient Instructions (Signed)
Less bread.  More insulin.  Check sugar daily.  If AM sugar is >150 give an extra unit If 100-149 no change in dose.  If <100 then take away a unit.  Start the antibiotics in the meantime.  Bland diet, clear liquids for now.  Update me in a few days.  Take care.  Recheck A1c in 3 months.

## 2013-05-03 NOTE — Progress Notes (Signed)
Pre-visit discussion using our clinic review tool. No additional management support is needed unless otherwise documented below in the visit note.  F/u for DM2 and abd pain.   DM2.  A1c 11.  He admits to diet noncompliance, hadn't even been checking sugars frequently.  Discussed.  He realizes this won't improve w/o effort on his part.   Abd pain continues.  Lower abd pain, episodic nonbloody diarrhea.  No vomiting.  No fevers.  We talked about his diverticulitis hx and the goal to avoid contrast studies.  No dysuria.    PMH and SH reviewed  ROS: See HPI, otherwise noncontributory.  Meds, vitals, and allergies reviewed.   nad ncat Mmm rrr ctab abd soft, ttp in LLQ but not in RLQ, no rebound, normal BS Ext w/o edema

## 2013-05-03 NOTE — Assessment & Plan Note (Signed)
Presumed diverticulitis, start augmentin and update me in a few days.  Nontoxic. To ER if worsening.  >25 min spent with face to face with patient, >50% counseling and/or coordinating care

## 2013-05-04 ENCOUNTER — Encounter: Payer: Self-pay | Admitting: Family Medicine

## 2013-05-14 ENCOUNTER — Ambulatory Visit: Payer: Medicare Other | Admitting: Family Medicine

## 2013-05-14 ENCOUNTER — Encounter: Payer: Self-pay | Admitting: Family Medicine

## 2013-05-14 ENCOUNTER — Ambulatory Visit (INDEPENDENT_AMBULATORY_CARE_PROVIDER_SITE_OTHER): Payer: Medicare Other | Admitting: Family Medicine

## 2013-05-14 VITALS — BP 142/74 | HR 63 | Temp 97.4°F | Wt 220.2 lb

## 2013-05-14 DIAGNOSIS — N058 Unspecified nephritic syndrome with other morphologic changes: Secondary | ICD-10-CM

## 2013-05-14 DIAGNOSIS — E119 Type 2 diabetes mellitus without complications: Secondary | ICD-10-CM

## 2013-05-14 DIAGNOSIS — E1129 Type 2 diabetes mellitus with other diabetic kidney complication: Secondary | ICD-10-CM

## 2013-05-14 DIAGNOSIS — R7989 Other specified abnormal findings of blood chemistry: Secondary | ICD-10-CM

## 2013-05-14 DIAGNOSIS — E291 Testicular hypofunction: Secondary | ICD-10-CM

## 2013-05-14 MED ORDER — TESTOSTERONE CYPIONATE 200 MG/ML IM SOLN
200.0000 mg | Freq: Once | INTRAMUSCULAR | Status: AC
  Administered 2013-05-14: 200 mg via INTRAMUSCULAR

## 2013-05-14 MED ORDER — INSULIN GLARGINE 100 UNIT/ML ~~LOC~~ SOLN: 28.0000 [IU] | Freq: Every day | SUBCUTANEOUS | Status: DC

## 2013-05-14 NOTE — Progress Notes (Signed)
Pre-visit discussion using our clinic review tool. No additional management support is needed unless otherwise documented below in the visit note.  DM2 f/u.  He has been checking his sugar, working on diet, eliminating carbs and increasing his lantus gradually to 28 units.  In meantime, fasting sugars much improved, recently <100.  Sugar log reviewed.  Now with some checks 2 hours after meals <200.  No lows.  D/w pt about plan.   Prev abd pain is resolved. This improved when he got on the abx.  No more cramping.    Due for testosterone shot today.   Meds, vitals, and allergies reviewed.   ROS: See HPI.  Otherwise, noncontributory.  nad ncat Mmm rrr ctab abd soft Ext w/o edema

## 2013-05-14 NOTE — Patient Instructions (Addendum)
Check your sugar each AM before breakfast.   Pick one day a week and check your sugar 2 hours after breakfast, before supper and then 2 hours after supper.  Drop off the numbers in about 2 weeks.  Recheck A1c in about 3 months before a visit.  Take care.

## 2013-05-15 NOTE — Assessment & Plan Note (Signed)
D/w pt.  He is working on diet and increasing his insulin.  Sugars much improved with dietary adherence.  Would have him continue as is.  He'll check some pre/post meal sugars along with AM sugars and drop those off for review.  We can consider mealtime insulin at that point, but it is deferred for now.  Recheck A1c in about 3 months.  He agrees.

## 2013-05-24 ENCOUNTER — Ambulatory Visit (INDEPENDENT_AMBULATORY_CARE_PROVIDER_SITE_OTHER): Payer: Medicare Other

## 2013-05-24 ENCOUNTER — Encounter: Payer: Self-pay | Admitting: Internal Medicine

## 2013-05-24 DIAGNOSIS — I2510 Atherosclerotic heart disease of native coronary artery without angina pectoris: Secondary | ICD-10-CM

## 2013-05-24 DIAGNOSIS — I5022 Chronic systolic (congestive) heart failure: Secondary | ICD-10-CM

## 2013-05-25 LAB — MDC_IDC_ENUM_SESS_TYPE_REMOTE
Battery Voltage: 3.01 V
Brady Statistic AP VP Percent: 0.03 %
Brady Statistic AP VS Percent: 0 %
Brady Statistic AS VP Percent: 99.9 %
Brady Statistic AS VS Percent: 0.07 %
Date Time Interrogation Session: 20141209023157
HighPow Impedance: 47 Ohm
HighPow Impedance: 71 Ohm
Lead Channel Impedance Value: 418 Ohm
Lead Channel Impedance Value: 551 Ohm
Lead Channel Impedance Value: 627 Ohm
Lead Channel Impedance Value: 836 Ohm
Lead Channel Sensing Intrinsic Amplitude: 2 mV
Lead Channel Setting Pacing Amplitude: 1 V
Lead Channel Setting Pacing Pulse Width: 1 ms
Zone Setting Detection Interval: 300 ms
Zone Setting Detection Interval: 350 ms

## 2013-06-01 ENCOUNTER — Telehealth: Payer: Self-pay | Admitting: Family Medicine

## 2013-06-01 NOTE — Telephone Encounter (Signed)
Noted from patient reviewed.  Improved diet adherence, improved AM sugars, usually ~120 recently.  Encouraged DM2 diet.

## 2013-06-14 ENCOUNTER — Encounter: Payer: Self-pay | Admitting: *Deleted

## 2013-06-23 ENCOUNTER — Telehealth: Payer: Self-pay | Admitting: *Deleted

## 2013-06-23 NOTE — Telephone Encounter (Signed)
Ok to send onetouch.  Thanks

## 2013-06-23 NOTE — Telephone Encounter (Signed)
Received letter from [employer] that pt's glucometer and test strips brand is no longer covered. The covered alternative ( provided at no additional cost to the patient) is either the OneTouch or Accu-Chek glucometer's and test strips. Is it ok to send in new rx? 

## 2013-06-24 MED ORDER — GLUCOSE BLOOD VI STRP: ORAL_STRIP | Status: DC

## 2013-06-24 MED ORDER — ONETOUCH ULTRA 2 W/DEVICE KIT: PACK | Status: DC

## 2013-06-24 MED ORDER — ONETOUCH ULTRASOFT LANCETS MISC: Status: DC

## 2013-06-24 NOTE — Telephone Encounter (Signed)
Rx sent to pharmacy electronically.   

## 2013-08-12 ENCOUNTER — Encounter: Payer: Medicare Other | Admitting: Internal Medicine

## 2013-08-16 ENCOUNTER — Encounter (INDEPENDENT_AMBULATORY_CARE_PROVIDER_SITE_OTHER): Payer: Self-pay

## 2013-08-16 ENCOUNTER — Encounter: Payer: Self-pay | Admitting: Internal Medicine

## 2013-08-16 ENCOUNTER — Ambulatory Visit (INDEPENDENT_AMBULATORY_CARE_PROVIDER_SITE_OTHER): Payer: Medicare Other | Admitting: Internal Medicine

## 2013-08-16 VITALS — BP 114/68 | HR 66 | Ht 71.0 in | Wt 219.0 lb

## 2013-08-16 DIAGNOSIS — Z95810 Presence of automatic (implantable) cardiac defibrillator: Secondary | ICD-10-CM

## 2013-08-16 DIAGNOSIS — I255 Ischemic cardiomyopathy: Secondary | ICD-10-CM

## 2013-08-16 DIAGNOSIS — I5022 Chronic systolic (congestive) heart failure: Secondary | ICD-10-CM

## 2013-08-16 DIAGNOSIS — I2589 Other forms of chronic ischemic heart disease: Secondary | ICD-10-CM

## 2013-08-16 LAB — MDC_IDC_ENUM_SESS_TYPE_INCLINIC
Brady Statistic AP VP Percent: 0.06 %
Brady Statistic AP VS Percent: 0 %
Brady Statistic AS VP Percent: 99.92 %
Brady Statistic AS VS Percent: 0.02 %
Brady Statistic RA Percent Paced: 0.06 %
HIGH POWER IMPEDANCE MEASURED VALUE: 70 Ohm
HighPow Impedance: 48 Ohm
Lead Channel Impedance Value: 342 Ohm
Lead Channel Impedance Value: 646 Ohm
Lead Channel Impedance Value: 836 Ohm
Lead Channel Pacing Threshold Amplitude: 1.625 V
Lead Channel Pacing Threshold Pulse Width: 0.4 ms
Lead Channel Sensing Intrinsic Amplitude: 1.875 mV
Lead Channel Sensing Intrinsic Amplitude: 10.25 mV
Lead Channel Setting Pacing Amplitude: 2 V
Lead Channel Setting Pacing Amplitude: 2.5 V
Lead Channel Setting Pacing Pulse Width: 0.4 ms
Lead Channel Setting Sensing Sensitivity: 0.3 mV
MDC IDC MSMT BATTERY VOLTAGE: 3.01 V
MDC IDC MSMT LEADCHNL LV PACING THRESHOLD AMPLITUDE: 0.5 V
MDC IDC MSMT LEADCHNL LV PACING THRESHOLD PULSEWIDTH: 1 ms
MDC IDC MSMT LEADCHNL RA IMPEDANCE VALUE: 532 Ohm
MDC IDC MSMT LEADCHNL RA PACING THRESHOLD PULSEWIDTH: 0.4 ms
MDC IDC MSMT LEADCHNL RA SENSING INTR AMPL: 3.25 mV
MDC IDC MSMT LEADCHNL RV IMPEDANCE VALUE: 380 Ohm
MDC IDC MSMT LEADCHNL RV PACING THRESHOLD AMPLITUDE: 1 V
MDC IDC MSMT LEADCHNL RV SENSING INTR AMPL: 8.125 mV
MDC IDC SESS DTM: 20150302220000
MDC IDC SET LEADCHNL LV PACING AMPLITUDE: 1 V
MDC IDC SET LEADCHNL LV PACING PULSEWIDTH: 1 ms
MDC IDC SET ZONE DETECTION INTERVAL: 300 ms
MDC IDC STAT BRADY RV PERCENT PACED: 99.98 %
Zone Setting Detection Interval: 350 ms
Zone Setting Detection Interval: 350 ms
Zone Setting Detection Interval: 400 ms

## 2013-08-16 NOTE — Patient Instructions (Addendum)
Your physician recommends that you continue on your current medications as directed. Please refer to the Current Medication list given to you today.  Remote monitoring is used to monitor your ICD from home. This monitoring reduces the number of office visits required to check your device to one time per year. It allows us to keep an eye on the functioning of your device to ensure it is working properly. You are scheduled for a device check from home on 11-17-2013. You may send your transmission at any time that day. If you have a wireless device, the transmission will be sent automatically. After your physician reviews your transmission, you will receive a postcard with your next transmission date.  Your physician recommends that you schedule a follow-up appointment in: 12 months with Dr.Taylor    

## 2013-08-16 NOTE — Assessment & Plan Note (Signed)
He has class 2 symptoms. He will continue his current meds. I have asked the paitient to reduce his sodium intake and increase his physical activity.

## 2013-08-16 NOTE — Progress Notes (Signed)
HPI Mr. Fussell returns today for ICD followup. He is a very pleasant 80 year old man with chronic systolic heart failure, and ischemic cardiomyopathy, status post ICD insertion. He has chronic left bundle branch block and is status post biventricular device implantation. His heart failure symptoms are class II. He has begun taking testosterone injections but remains sedentary with bad knees. His Hgb A1C has been elevated.  Allergies  Allergen Reactions  . Celecoxib     REACTION: myalgia: abdomenal pain  . Nsaids     REACTION: renal disease  . Rosuvastatin     REACTION: myalgia: musle pain     Current Outpatient Prescriptions  Medication Sig Dispense Refill  . aspirin 325 MG EC tablet Take 325 mg by mouth 2 (two) times daily.      . carvedilol (COREG) 12.5 MG tablet TAKE 1 TABLET BY MOUTH TWICE A DAY  180 tablet  3  . cholecalciferol (VITAMIN D) 1000 UNITS tablet Take 2,000 Units by mouth daily.      . insulin glargine (LANTUS) 100 UNIT/ML injection Inject 25 Units into the skin at bedtime.      [name] lisinopril (PRINIVIL,ZESTRIL) 2.5 MG tablet TAKE 1 TABLET BY MOUTH DAILY  30 tablet  7  . nitroGLYCERIN (NITROSTAT) 0.4 MG SL tablet Place 1 tablet (0.4 mg total) under the tongue every 5 (five) minutes as needed.  25 tablet  6  . Nutritional Supplements (GLUCERNA SHAKE PO) Take 4 oz by mouth daily after breakfast.        . pravastatin (PRAVACHOL) 40 MG tablet TAKE 1 TABLET BY MOUTH AT BEDTIME  30 tablet  9  . ranitidine (ZANTAC) 75 MG tablet Take 75 mg by mouth as needed.        . testosterone cypionate (DEPOTESTOTERONE CYPIONATE) 200 MG/ML injection INJECT 1.25ML INTO THE MUSCLE EVERY 28 DAYS  10 mL  0   No current facility-administered medications for this visit.     Past Medical History  Diagnosis Date  . HTN (hypertension) (06/17/1981)  . Diverticula of colon (06/18/1983)  . Renal insufficiency   . Hyperlipidemia 06/17/1993)  . Diabetes mellitus  (06/17/1997)  . PUD (peptic ulcer  disease) 1960's  . Pancreatitis 01/24 - 07/13/08    ABD CT  fatty liver early pancreatitis  07/12/2008  Pelvic CT Nml 07/12/2008  . Cardiomyopathy     EF 25% Ant Akinesis Global Hypo 3/2-08/18/08  . Thyroid nodule     per Dr. Brantley Stage  . Renal disease     per Dr. Posey Pronto  . Hypogonadism male     ROS:   All systems reviewed and negative except as noted in the HPI.   Past Surgical History  Procedure Laterality Date  . Colonoscopy    . Cardiac catheterization       Severe 3-vessel coronary artery disease.  Ischemic   cardiomyopathy.   . Nephrectomy  01/09/09    Clear Cell Renal Cell Carcinoma 01/09/2009  . Cardiac defibrillator placement      ICD-Medtronic .  [id number]  . Laparotomy       small mid 80's (laparotomy), 09/12/2000 (no surgery), 10/20/2008 (no surgery)  . Lesion removal      RLE 11/14/2006 (Dr Hulen Skains)   . Ct of abdomen  06/1997    Fatty process unchanged - myelolipoma adrenal unchangd  . Ct of abdomen  02/99    Adrenal mass unchanged, no further follow up required  . Sbo  08/2000 no surgery   10/20/08 no surgery  Mid 80's laparotomy  . Esophagogastroduodenoscopy  07/12/2008    Schatzki Ring 2 cm. HH (Dr. Deatra Ina)  . Abdominal US  07/12/08    Fatty infiltrate liver complex cyst in lower pole left kidney  . Left renal ca  2010    s/p partial nephrectomy per Dr. Alinda Money  . Icd/ crt (lv lead disabled)      Medtronic [id number]  . Coronary artery bypass graft  09/05/08     Mediastinotomy, extracorporeal circulation,   coronary artery bypass graft surgery x4 using a left internal mammary  artery graft to the left anterior descending coronary artery, with a  saphenous vein graft to the first diagonal branch of the LAD, a   saphenous vein graft to the obtuse marginal branch of the left   circumflex coronary artery,  Dr. Cyndia Bent  . Cholecystectomy  1970's     Family History  Problem Relation Age of Onset  . Angina Father   . Hypertension Father   . Heart disease Father      MI, angina  . Breast cancer Mother   . Cancer Mother     Breast  . Heart disease Mother     Valve disease  . Cancer Brother     Brain tumor     History   Social History  . Marital Status: Married    Spouse Name: N/A    Number of Children: 2  . Years of Education: N/A   Occupational History  . Retired from [employer]    Social History Main Topics  . Smoking status: Former Smoker    Quit date: 06/17/1938  . Smokeless tobacco: Never Used  . Alcohol Use: Yes     Comment: Rare  . Drug Use: No  . Sexual Activity: Not on file   Other Topics Concern  . Not on file   Social History Narrative    The patient is retired.  He has been married since age        of 84.  They have 2 children.  They have grandchildren and great        grandchildren.       BP 114/68  Pulse 66  Ht 5\' 11"  (1.803 m)  Wt 219 lb (99.338 kg)  BMI 30.56 kg/m2  Physical Exam:  Well appearing elderly man,NAD HEENT: Unremarkable Neck:  7 cm JVD, no thyromegally Lungs:  Clear with no wheezes, rales, or rhonchi. HEART:  Regular rate rhythm, no murmurs, no rubs, no clicks Abd:  soft, positive bowel sounds, no organomegally, no rebound, no guarding Ext:  2 plus pulses, no edema, no cyanosis, no clubbing Skin:  No rashes no nodules Neuro:  CN II through XII intact, motor grossly intact   DEVICE  Normal device function.  See PaceArt for details.   Assess/Plan:

## 2013-08-16 NOTE — Assessment & Plan Note (Signed)
His medtronic BiV ICD is working normally. Will recheck in several months.  

## 2013-08-18 ENCOUNTER — Telehealth: Payer: Self-pay | Admitting: Family Medicine

## 2013-08-18 NOTE — Telephone Encounter (Signed)
Patient notified as instructed by telephone. Patient will call tomorrow and talk with Rena and set up the appointment for the injection.

## 2013-08-18 NOTE — Telephone Encounter (Signed)
Pt has to come in office for testosterone injections. Pt already does his own diabetic injections and was wanting to know if he could start doing them himself instead of coming into the office. Please advise.

## 2013-08-18 NOTE — Telephone Encounter (Signed)
He's reliable, so yes.  If he is okay with it, then I'm okay.  I would like him to have one last visit where he does everything for the injection here at a RN visit.  If he gets it done correctly, then yes.  Let me know if he needs syringes, etc.  Thanks.

## 2013-08-19 NOTE — Telephone Encounter (Signed)
Spoke with pt and he will come at scheduled date 09/08/13 and will give himself the testosterone.

## 2013-08-19 NOTE — Telephone Encounter (Signed)
Since Dr Damita Dunnings advised injection by RN supervision will send note to [facility].

## 2013-08-23 ENCOUNTER — Encounter: Payer: Self-pay | Admitting: Internal Medicine

## 2013-08-23 ENCOUNTER — Ambulatory Visit (INDEPENDENT_AMBULATORY_CARE_PROVIDER_SITE_OTHER): Payer: Medicare Other | Admitting: Internal Medicine

## 2013-08-23 VITALS — BP 116/72 | HR 60 | Temp 97.5°F | Wt 215.0 lb

## 2013-08-23 DIAGNOSIS — J309 Allergic rhinitis, unspecified: Secondary | ICD-10-CM

## 2013-08-23 DIAGNOSIS — J3489 Other specified disorders of nose and nasal sinuses: Secondary | ICD-10-CM

## 2013-08-23 DIAGNOSIS — R51 Headache: Secondary | ICD-10-CM

## 2013-08-23 DIAGNOSIS — R0981 Nasal congestion: Secondary | ICD-10-CM

## 2013-08-23 DIAGNOSIS — R195 Other fecal abnormalities: Secondary | ICD-10-CM

## 2013-08-23 DIAGNOSIS — R42 Dizziness and giddiness: Secondary | ICD-10-CM

## 2013-08-23 MED ORDER — FLUTICASONE PROPIONATE 50 MCG/ACT NA SUSP: 2.0000 | Freq: Every day | NASAL | Status: DC

## 2013-08-23 NOTE — Progress Notes (Signed)
Pre visit review using our clinic review tool, if applicable. No additional management support is needed unless otherwise documented below in the visit note. 

## 2013-08-23 NOTE — Progress Notes (Signed)
Subjective:    Patient ID: [name], male    DOB: 1926-05-17, 80 y.o.   MRN: [id number]  HPI  Pt presents to the clinic today with c/o headache, dizziness, nasal congestion and diarrhea. He reports this all started last week. He describes the headache as pressure. He does have nasal drainage. The drainage is clear.The dizziness only occurred this am when he woke up and then resolved quickly. He denies chest pain, chest tightness or shortness of breath. He does not feel confused. He does report that his stool is loose, but normal in color. He has not had any loose stool since this am.  Review of Systems      Past Medical History  Diagnosis Date  . HTN (hypertension) (06/17/1981)  . Diverticula of colon (06/18/1983)  . Renal insufficiency   . Hyperlipidemia 06/17/1993)  . Diabetes mellitus  (06/17/1997)  . PUD (peptic ulcer disease) 1960's  . Pancreatitis 01/24 - 07/13/08    ABD CT  fatty liver early pancreatitis  07/12/2008  Pelvic CT Nml 07/12/2008  . Cardiomyopathy     EF 25% Ant Akinesis Global Hypo 3/2-08/18/08  . Thyroid nodule     per Dr. Brantley Stage  . Renal disease     per Dr. Posey Pronto  . Hypogonadism male     Current Outpatient Prescriptions  Medication Sig Dispense Refill  . aspirin 325 MG EC tablet Take 325 mg by mouth 2 (two) times daily.      . carvedilol (COREG) 12.5 MG tablet TAKE 1 TABLET BY MOUTH TWICE A DAY  180 tablet  3  . cholecalciferol (VITAMIN D) 1000 UNITS tablet Take 2,000 Units by mouth daily.      . insulin glargine (LANTUS) 100 UNIT/ML injection Inject 25 Units into the skin at bedtime.      [name] lisinopril (PRINIVIL,ZESTRIL) 2.5 MG tablet TAKE 1 TABLET BY MOUTH DAILY  30 tablet  7  . nitroGLYCERIN (NITROSTAT) 0.4 MG SL tablet Place 1 tablet (0.4 mg total) under the tongue every 5 (five) minutes as needed.  25 tablet  6  . Nutritional Supplements (GLUCERNA SHAKE PO) Take 4 oz by mouth daily after breakfast.        . pravastatin (PRAVACHOL) 40 MG  tablet TAKE 1 TABLET BY MOUTH AT BEDTIME  30 tablet  9  . ranitidine (ZANTAC) 75 MG tablet Take 75 mg by mouth as needed.        . testosterone cypionate (DEPOTESTOTERONE CYPIONATE) 200 MG/ML injection INJECT 1.25ML INTO THE MUSCLE EVERY 28 DAYS  10 mL  0   No current facility-administered medications for this visit.    Allergies  Allergen Reactions  . Celecoxib     REACTION: myalgia: abdomenal pain  . Nsaids     REACTION: renal disease  . Rosuvastatin     REACTION: myalgia: musle pain    Family History  Problem Relation Age of Onset  . Angina Father   . Hypertension Father   . Heart disease Father     MI, angina  . Breast cancer Mother   . Cancer Mother     Breast  . Heart disease Mother     Valve disease  . Cancer Brother     Brain tumor    History   Social History  . Marital Status: Married    Spouse Name: N/A    Number of Children: 2  . Years of Education: N/A   Occupational History  . 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 Smoking status: Former Smoker    Quit date: 06/17/1938  . Smokeless tobacco: Never Used  . Alcohol Use: Yes     Comment: Rare  . Drug Use: No  . Sexual Activity: Not on file   Other Topics Concern  . Not on file   Social History Narrative    The patient is retired.  He has been married since age        of 37.  They have 2 children.  They have grandchildren and great        grandchildren.       Constitutional: Pt reports headache. Denies fever, malaise, fatigue, or abrupt weight changes.  HEENT: Pt reports nasal congestion. Denies eye pain, eye redness, ear pain, ringing in the ears, wax buildup, runny nose,  bloody nose, or sore throat. Respiratory: Denies difficulty breathing, shortness of breath, cough or sputum production.   Cardiovascular: Denies chest pain, chest tightness, palpitations or swelling in the hands or feet.  Gastrointestinal: Pt reports loose stool.  Denies abdominal pain, bloating, constipation, diarrhea or blood in the stool.  Neurological: Pt reports dizziness. Denies difficulty with memory, difficulty with speech or problems with balance and coordination.   No other specific complaints in a complete review of systems (except as listed in HPI above).  Objective:   Physical Exam   BP 116/72  Pulse 60  Temp(Src) 97.5 F (36.4 C) (Oral)  Wt 215 lb (97.523 kg)  SpO2 97% Wt Readings from Last 3 Encounters:  08/23/13 215 lb (97.523 kg)  08/16/13 219 lb (99.338 kg)  05/14/13 220 lb 4 oz (99.905 kg)    General: Appears his stated age, chronically ill appearing in NAD.  HEENT: Head: normal shape and size; Eyes: sclera white, no icterus, conjunctiva pink, PERRLA and EOMs intact; Ears: Tm's gray and intact, normal light reflex; Nose: mucosa pink and moist, septum midline; Throat/Mouth: Teeth present, mucosa pink and moist, no exudate, lesions or ulcerations noted.  Cardiovascular: Normal rate and rhythm. S1,S2 noted.  No murmur, rubs or gallops noted. Pulmonary/Chest: Normal effort and positive vesicular breath sounds. No respiratory distress. No wheezes, rales or ronchi noted.  Abdomen: Soft and nontender. Normal bowel sounds, no bruits noted. No distention or masses noted. Liver, spleen and kidneys non palpable. Neurological: Alert and oriented.  Coordination normal.    BMET    Component Value Date/Time   NA 139 03/13/2011 1045   K 4.7 03/22/2011 0945   CL 104 03/13/2011 1045   CO2 25 03/13/2011 1045   GLUCOSE 263* 03/13/2011 1045   BUN 41* 03/13/2011 1045   CREATININE 2.8* 03/13/2011 1045   CALCIUM 9.0 03/13/2011 1045   GFRNONAA 27* 04/30/2010 1354   GFRAA  Value: 32        The eGFR has been calculated using the MDRD equation. This calculation has not been validated in all clinical situations. eGFR's persistently <60 mL/min signify possible Chronic Kidney Disease.* 04/30/2010 1354    Lipid Panel     Component Value Date/Time    CHOL 158 11/30/2012 1009   TRIG 247.0* 11/30/2012 1009   HDL 38.40* 11/30/2012 1009   CHOLHDL 4 11/30/2012 1009   VLDL 49.4* 11/30/2012 1009   LDLCALC 66 10/02/2010 0859    CBC    Component Value Date/Time   WBC 10.6* 04/29/2013 1311   RBC 5.00 04/29/2013 1311   HGB 14.8 04/29/2013 1311   HCT 44.3 04/29/2013 1311  PLT 197.0 04/29/2013 1311   MCV 88.6 04/29/2013 1311   MCH 27.8 04/30/2010 1354   MCHC 33.5 04/29/2013 1311   RDW 13.9 04/29/2013 1311   LYMPHSABS 1.4 04/29/2013 1311   MONOABS 0.9 04/29/2013 1311   EOSABS 0.4 04/29/2013 1311   BASOSABS 0.0 04/29/2013 1311    Hgb A1C Lab Results  Component Value Date   HGBA1C 11.0* 04/29/2013        Assessment & Plan:   Allergic Rhinits:  Try claritin OTC daily x 2 weeks eRx for flonase No indication for abx at this time  RTC as needed or if symptoms persist or worsen

## 2013-08-23 NOTE — Patient Instructions (Signed)

## 2013-09-08 ENCOUNTER — Telehealth: Payer: Self-pay

## 2013-09-08 ENCOUNTER — Ambulatory Visit (INDEPENDENT_AMBULATORY_CARE_PROVIDER_SITE_OTHER): Payer: Medicare Other

## 2013-09-08 ENCOUNTER — Other Ambulatory Visit (INDEPENDENT_AMBULATORY_CARE_PROVIDER_SITE_OTHER): Payer: Medicare Other

## 2013-09-08 DIAGNOSIS — E291 Testicular hypofunction: Secondary | ICD-10-CM

## 2013-09-08 DIAGNOSIS — E349 Endocrine disorder, unspecified: Secondary | ICD-10-CM

## 2013-09-08 DIAGNOSIS — E119 Type 2 diabetes mellitus without complications: Secondary | ICD-10-CM

## 2013-09-08 LAB — TESTOSTERONE: TESTOSTERONE: 173.05 ng/dL — AB (ref 350.00–890.00)

## 2013-09-08 LAB — HEMOGLOBIN A1C: Hgb A1c MFr Bld: 10.2 % — ABNORMAL HIGH (ref 4.6–6.5)

## 2013-09-08 MED ORDER — TESTOSTERONE CYPIONATE 200 MG/ML IM SOLN
250.0000 mg | Freq: Once | INTRAMUSCULAR | Status: AC
  Administered 2013-09-08: 250 mg via INTRAMUSCULAR

## 2013-09-08 NOTE — Telephone Encounter (Signed)
Noted; pt had lab test before testosterone injection; pt decided he would continue to come here for his injections instead of giving injection to himself. Pt scheduled for testosterone inj in 1 month.

## 2013-09-08 NOTE — Telephone Encounter (Signed)
Noted, thanks!

## 2013-09-08 NOTE — Telephone Encounter (Signed)
I think that was in reference to the concentration, not dose.   I would give 1.84mL, 200mg  per mL, to equal 250mg  in the dose.   He's due for an A1c and T level.  I'd do both before the shot.  Thanks.   Future orders in for T level and A1c.

## 2013-09-08 NOTE — Telephone Encounter (Signed)
On med list testosterone 200 mg with instructions give 1.25 ml or 225mg  IM.  On some nursing visits notes appears 200 mg given and other notes 225 mg given. Example 03/16/13 and 05/14/13 nurse visit. Wanted to verify what dosage pt is to take. Pt is here now for instruction on giving self testosterone injections.Please advise.

## 2013-09-20 ENCOUNTER — Ambulatory Visit (INDEPENDENT_AMBULATORY_CARE_PROVIDER_SITE_OTHER): Payer: Medicare Other | Admitting: Internal Medicine

## 2013-09-20 ENCOUNTER — Encounter: Payer: Self-pay | Admitting: Internal Medicine

## 2013-09-20 VITALS — BP 128/70 | HR 90 | Temp 98.4°F | Resp 22 | Wt 219.0 lb

## 2013-09-20 DIAGNOSIS — J019 Acute sinusitis, unspecified: Secondary | ICD-10-CM | POA: Insufficient documentation

## 2013-09-20 MED ORDER — AMOXICILLIN 500 MG PO TABS: 1000.0000 mg | ORAL_TABLET | Freq: Two times a day (BID) | ORAL | Status: DC

## 2013-09-20 NOTE — Patient Instructions (Signed)

## 2013-09-20 NOTE — Assessment & Plan Note (Signed)
Sounds like he has secondary bacterial infection Will try amoxil Continue supportive care

## 2013-09-20 NOTE — Progress Notes (Signed)
Subjective:    Patient ID: [name], male    DOB: June 13, 1926, 80 y.o.   MRN: [id number]  HPI Here with wife  Feels like he is progressing from last month Congestion, sneezing, coughing--- bad mucus almost causing him to strangle Some pain in right lower chest and left flank  Has been using the loratadine and cough drops Also tussin DM--does help some ?now having mucus from chest (not just from PND) Slight SOB---but no major problems No fever  Current Outpatient Prescriptions on File Prior to Visit  Medication Sig Dispense Refill  . aspirin 325 MG EC tablet Take 325 mg by mouth 2 (two) times daily.      . carvedilol (COREG) 12.5 MG tablet TAKE 1 TABLET BY MOUTH TWICE A DAY  180 tablet  3  . cholecalciferol (VITAMIN D) 1000 UNITS tablet Take 2,000 Units by mouth daily.      . fluticasone (FLONASE) 50 MCG/ACT nasal spray Place 2 sprays into both nostrils daily.  16 g  6  . insulin glargine (LANTUS) 100 UNIT/ML injection Inject 25 Units into the skin at bedtime.      [name] lisinopril (PRINIVIL,ZESTRIL) 2.5 MG tablet TAKE 1 TABLET BY MOUTH DAILY  30 tablet  7  . nitroGLYCERIN (NITROSTAT) 0.4 MG SL tablet Place 1 tablet (0.4 mg total) under the tongue every 5 (five) minutes as needed.  25 tablet  6  . Nutritional Supplements (GLUCERNA SHAKE PO) Take 4 oz by mouth daily after breakfast.        . pravastatin (PRAVACHOL) 40 MG tablet TAKE 1 TABLET BY MOUTH AT BEDTIME  30 tablet  9  . ranitidine (ZANTAC) 75 MG tablet Take 75 mg by mouth as needed.        . testosterone cypionate (DEPOTESTOTERONE CYPIONATE) 200 MG/ML injection INJECT 1.25ML INTO THE MUSCLE EVERY 28 DAYS  10 mL  0   No current facility-administered medications on file prior to visit.    Allergies  Allergen Reactions  . Celecoxib     REACTION: myalgia: abdomenal pain  . Nsaids     REACTION: renal disease  . Rosuvastatin     REACTION: myalgia: musle pain    Past Medical History  Diagnosis Date  . HTN  (hypertension) (06/17/1981)  . Diverticula of colon (06/18/1983)  . Renal insufficiency   . Hyperlipidemia 06/17/1993)  . Diabetes mellitus  (06/17/1997)  . PUD (peptic ulcer disease) 1960's  . Pancreatitis 01/24 - 07/13/08    ABD CT  fatty liver early pancreatitis  07/12/2008  Pelvic CT Nml 07/12/2008  . Cardiomyopathy     EF 25% Ant Akinesis Global Hypo 3/2-08/18/08  . Thyroid nodule     per Dr. Brantley Stage  . Renal disease     per Dr. Posey Pronto  . Hypogonadism male     Past Surgical History  Procedure Laterality Date  . Colonoscopy    . Cardiac catheterization       Severe 3-vessel coronary artery disease.  Ischemic   cardiomyopathy.   . Nephrectomy  01/09/09    Clear Cell Renal Cell Carcinoma 01/09/2009  . Cardiac defibrillator placement      ICD-Medtronic .  [id number]  . Laparotomy       small mid 80's (laparotomy), 09/12/2000 (no surgery), 10/20/2008 (no surgery)  . Lesion removal      RLE 11/14/2006 (Dr Hulen Skains)   . Ct of abdomen  06/1997    Fatty process unchanged - myelolipoma adrenal unchangd  . Ct of  abdomen  02/99    Adrenal mass unchanged, no further follow up required  . Sbo  08/2000 no surgery   10/20/08 no surgery    Mid 80's laparotomy  . Esophagogastroduodenoscopy  07/12/2008    Schatzki Ring 2 cm. HH (Dr. Deatra Ina)  . Abdominal US  07/12/08    Fatty infiltrate liver complex cyst in lower pole left kidney  . Left renal ca  2010    s/p partial nephrectomy per Dr. Alinda Money  . Icd/ crt (lv lead disabled)      Medtronic [id number]  . Coronary artery bypass graft  09/05/08     Mediastinotomy, extracorporeal circulation,   coronary artery bypass graft surgery x4 using a left internal mammary  artery graft to the left anterior descending coronary artery, with a  saphenous vein graft to the first diagonal branch of the LAD, a   saphenous vein graft to the obtuse marginal branch of the left   circumflex coronary artery,  Dr. Cyndia Bent  . Cholecystectomy  1970's    Family History    Problem Relation Age of Onset  . Angina Father   . Hypertension Father   . Heart disease Father     MI, angina  . Breast cancer Mother   . Cancer Mother     Breast  . Heart disease Mother     Valve disease  . Cancer Brother     Brain tumor    History   Social History  . Marital Status: Married    Spouse Name: N/A    Number of Children: 2  . Years of Education: N/A   Occupational History  . Retired from [employer]    Social History Main Topics  . Smoking status: Former Smoker    Quit date: 06/17/1938  . Smokeless tobacco: Never Used  . Alcohol Use: Yes     Comment: Rare  . Drug Use: No  . Sexual Activity: Not on file   Other Topics Concern  . Not on file   Social History Narrative    The patient is retired.  He has been married since age        of 73.  They have 2 children.  They have grandchildren and great        grandchildren.     Review of Systems Some diarrhea for past week--more liquidy in past few days. But only 1-2 per day generally.     Objective:   Physical Exam  Constitutional: He appears well-developed and well-nourished. No distress.  HENT:  Mouth/Throat: Oropharynx is clear and moist. No oropharyngeal exudate.  Mild frontal tenderness TMs normal Mild pale nasal congestion  Neck: Normal range of motion. Neck supple.  Pulmonary/Chest: Effort normal and breath sounds normal. No respiratory distress. He has no wheezes. He has no rales.  No dullness  Lymphadenopathy:    He has no cervical adenopathy.          Assessment & Plan:

## 2013-09-20 NOTE — Progress Notes (Signed)
Pre visit review using our clinic review tool, if applicable. No additional management support is needed unless otherwise documented below in the visit note. 

## 2013-09-21 ENCOUNTER — Other Ambulatory Visit: Payer: Medicare Other

## 2013-09-28 ENCOUNTER — Ambulatory Visit: Payer: Medicare Other | Admitting: Family Medicine

## 2013-10-07 ENCOUNTER — Ambulatory Visit (INDEPENDENT_AMBULATORY_CARE_PROVIDER_SITE_OTHER): Payer: Medicare Other

## 2013-10-07 DIAGNOSIS — E291 Testicular hypofunction: Secondary | ICD-10-CM

## 2013-10-07 MED ORDER — TESTOSTERONE CYPIONATE 200 MG/ML IM SOLN
250.0000 mg | Freq: Once | INTRAMUSCULAR | Status: AC
  Administered 2013-10-07: 250 mg via INTRAMUSCULAR

## 2013-10-07 NOTE — Telephone Encounter (Signed)
Opened in error

## 2013-10-11 ENCOUNTER — Other Ambulatory Visit: Payer: Medicare Other

## 2013-10-15 ENCOUNTER — Encounter: Payer: Self-pay | Admitting: Family Medicine

## 2013-10-15 LAB — CREATININE, SERUM
BUN: 37 mg/dL — AB (ref 4–21)
Creatinine, Ser: 2.53
GLUCOSE: 264
POTASSIUM, SERUM: 6.2
SODIUM: 139 mmol/L (ref 137–147)

## 2013-10-18 ENCOUNTER — Encounter: Payer: Self-pay | Admitting: Family Medicine

## 2013-10-18 ENCOUNTER — Ambulatory Visit (INDEPENDENT_AMBULATORY_CARE_PROVIDER_SITE_OTHER): Payer: Medicare Other | Admitting: Family Medicine

## 2013-10-18 VITALS — BP 132/70 | HR 67 | Temp 97.6°F | Wt 222.8 lb

## 2013-10-18 DIAGNOSIS — IMO0002 Reserved for concepts with insufficient information to code with codable children: Secondary | ICD-10-CM

## 2013-10-18 DIAGNOSIS — E1165 Type 2 diabetes mellitus with hyperglycemia: Principal | ICD-10-CM

## 2013-10-18 DIAGNOSIS — E1129 Type 2 diabetes mellitus with other diabetic kidney complication: Secondary | ICD-10-CM

## 2013-10-18 NOTE — Patient Instructions (Signed)
Call ortho about your knees.  Don't change your meds for now and keep working on your diet.   Recheck labs in about 3-4 months before a visit.  Take care.  Glad to see you.

## 2013-10-18 NOTE — Progress Notes (Signed)
Pre visit review using our clinic review tool, if applicable. No additional management support is needed unless otherwise documented below in the visit note.  Diabetes:  Using medications without difficulties:yes Hypoglycemic episodes:only with prolonged fasting Hyperglycemic episodes:prev yes Feet problems: yes- some numbness.   Blood Sugars averaging: see below eye exam within last year: pending for June He had admitted diet noncompliance prev but worked more in diet in the meantime and sugars down to 140 or lower recently.    Knee pain continues.  H/o OA.  He's going to f/u with ortho about that.    He had f/u with renal re: repeat K but hasn't heard about that yet.  I told him I would defer to renal.    PMH and SH reviewed  Meds, vitals, and allergies reviewed.   ROS: See HPI.  Otherwise negative.    GEN: nad, alert and oriented HEENT: mucous membranes moist NECK: supple w/o LA CV: rrr. PULM: ctab, no inc wob ABD: soft, +bs EXT: trace BLE edema SKIN: no acute rash  Diabetic foot exam: Normal inspection No skin breakdown No calluses  1+ DP pulses Normal sensation to light touch and monofilament Nails thickened

## 2013-10-19 NOTE — Assessment & Plan Note (Signed)
Improved home readings recently, with work on diet.  Goal A1c ~8.  At this point, I told him I'm trying to keep him out of the ER/hospital due to his chronically elevated A1c.  He'll continue work on diet in the meantime.  Recheck in a few months. He agrees.

## 2013-10-26 ENCOUNTER — Telehealth: Payer: Self-pay

## 2013-10-26 NOTE — Telephone Encounter (Signed)
Relevant patient education assigned to patient using Emmi. ° °

## 2013-11-03 ENCOUNTER — Other Ambulatory Visit: Payer: Self-pay | Admitting: Family Medicine

## 2013-11-03 NOTE — Telephone Encounter (Signed)
Received refill request electronically. Last refill 10/26/12, last office visit 10/18/13. Is it okay to refill medication.

## 2013-11-03 NOTE — Telephone Encounter (Signed)
Please send.  Thanks!

## 2013-11-04 ENCOUNTER — Ambulatory Visit (INDEPENDENT_AMBULATORY_CARE_PROVIDER_SITE_OTHER): Payer: Medicare Other | Admitting: *Deleted

## 2013-11-04 ENCOUNTER — Ambulatory Visit: Payer: Medicare Other | Admitting: Family Medicine

## 2013-11-04 DIAGNOSIS — E291 Testicular hypofunction: Secondary | ICD-10-CM

## 2013-11-04 MED ORDER — TESTOSTERONE CYPIONATE 200 MG/ML IM SOLN
250.0000 mg | Freq: Once | INTRAMUSCULAR | Status: AC
  Administered 2013-11-04: 250 mg via INTRAMUSCULAR

## 2013-11-04 NOTE — Telephone Encounter (Signed)
Medication phoned to pharmacy.  

## 2013-11-09 ENCOUNTER — Encounter: Payer: Medicare Other | Admitting: Family Medicine

## 2013-11-17 ENCOUNTER — Ambulatory Visit (INDEPENDENT_AMBULATORY_CARE_PROVIDER_SITE_OTHER): Payer: Medicare Other | Admitting: *Deleted

## 2013-11-17 ENCOUNTER — Encounter: Payer: Self-pay | Admitting: Internal Medicine

## 2013-11-17 ENCOUNTER — Telehealth: Payer: Self-pay

## 2013-11-17 DIAGNOSIS — I428 Other cardiomyopathies: Secondary | ICD-10-CM

## 2013-11-17 DIAGNOSIS — I5022 Chronic systolic (congestive) heart failure: Secondary | ICD-10-CM

## 2013-11-17 NOTE — Progress Notes (Signed)
Remote ICD transmission.   

## 2013-11-17 NOTE — Telephone Encounter (Signed)
Peggy nurse case mgr with [employer] left v/m requesting cb with last 2 HgbA1c; pt is participating in case mgt program for diabetes; spoke with Katharine Look clapp and she said OK to give info. Left on secured v/m 09/08/13 A1c was 10.2 and 04/29/2013 A1C was 11.0.

## 2013-11-18 LAB — MDC_IDC_ENUM_SESS_TYPE_REMOTE
Battery Voltage: 2.98 V
Brady Statistic AP VP Percent: 0.09 %
Brady Statistic AP VS Percent: 0 %
Brady Statistic AS VP Percent: 99.85 %
Brady Statistic AS VS Percent: 0.06 %
Brady Statistic RA Percent Paced: 0.09 %
Brady Statistic RV Percent Paced: 99.94 %
Date Time Interrogation Session: 20150603052407
HighPow Impedance: 48 Ohm
HighPow Impedance: 68 Ohm
Lead Channel Impedance Value: 342 Ohm
Lead Channel Impedance Value: 380 Ohm
Lead Channel Impedance Value: 551 Ohm
Lead Channel Impedance Value: 855 Ohm
Lead Channel Pacing Threshold Amplitude: 1 V
Lead Channel Pacing Threshold Pulse Width: 0.4 ms
Lead Channel Pacing Threshold Pulse Width: 0.4 ms
Lead Channel Pacing Threshold Pulse Width: 1 ms
Lead Channel Sensing Intrinsic Amplitude: 2.125 mV
Lead Channel Sensing Intrinsic Amplitude: 2.125 mV
Lead Channel Sensing Intrinsic Amplitude: 9.875 mV
Lead Channel Setting Pacing Amplitude: 1 V
Lead Channel Setting Pacing Amplitude: 2 V
Lead Channel Setting Pacing Pulse Width: 1 ms
Lead Channel Setting Sensing Sensitivity: 0.3 mV
MDC IDC MSMT LEADCHNL LV IMPEDANCE VALUE: 627 Ohm
MDC IDC MSMT LEADCHNL LV PACING THRESHOLD AMPLITUDE: 0.5 V
MDC IDC MSMT LEADCHNL RA PACING THRESHOLD AMPLITUDE: 1.25 V
MDC IDC MSMT LEADCHNL RV SENSING INTR AMPL: 9.875 mV
MDC IDC SET LEADCHNL RV PACING AMPLITUDE: 2.5 V
MDC IDC SET LEADCHNL RV PACING PULSEWIDTH: 0.4 ms
MDC IDC SET ZONE DETECTION INTERVAL: 400 ms
Zone Setting Detection Interval: 300 ms
Zone Setting Detection Interval: 350 ms
Zone Setting Detection Interval: 350 ms

## 2013-11-22 ENCOUNTER — Encounter: Payer: Self-pay | Admitting: Family Medicine

## 2013-11-22 ENCOUNTER — Ambulatory Visit (INDEPENDENT_AMBULATORY_CARE_PROVIDER_SITE_OTHER): Payer: Medicare Other | Admitting: Family Medicine

## 2013-11-22 VITALS — BP 124/64 | HR 66 | Temp 97.4°F | Ht 72.0 in | Wt 224.5 lb

## 2013-11-22 DIAGNOSIS — E1165 Type 2 diabetes mellitus with hyperglycemia: Secondary | ICD-10-CM

## 2013-11-22 DIAGNOSIS — N183 Chronic kidney disease, stage 3 unspecified: Secondary | ICD-10-CM

## 2013-11-22 DIAGNOSIS — E119 Type 2 diabetes mellitus without complications: Secondary | ICD-10-CM

## 2013-11-22 DIAGNOSIS — Z0000 Encounter for general adult medical examination without abnormal findings: Secondary | ICD-10-CM

## 2013-11-22 DIAGNOSIS — IMO0002 Reserved for concepts with insufficient information to code with codable children: Secondary | ICD-10-CM

## 2013-11-22 DIAGNOSIS — E1129 Type 2 diabetes mellitus with other diabetic kidney complication: Secondary | ICD-10-CM

## 2013-11-22 NOTE — Progress Notes (Signed)
Pre visit review using our clinic review tool, if applicable. No additional management support is needed unless otherwise documented below in the visit note.  I have personally reviewed the Medicare Annual Wellness questionnaire and have noted 1. The patient's medical and social history 2. Their use of alcohol, tobacco or illicit drugs 3. Their current medications and supplements 4. The patient's functional ability including ADL's, fall risks, home safety risks and hearing or visual             impairment. 5. Diet and physical activities 6. Evidence for depression or mood disorders  The patients weight, height, BMI have been recorded in the chart and visual acuity is per eye clinic.  I have made referrals, counseling and provided education to the patient based review of the above and I have provided the pt with a written personalized care plan for preventive services.  See scanned forms.  Routine anticipatory guidance given to patient.  See health maintenance. Flu 2014 Shingles 2012 PNA 1999 Tetanus 2006 Colonoscopy not due given his age.  He agrees.  Prostate cancer screening not due given his age.  He agrees.  Advance directive- wife designated if patient were incapacitated.   Cognitive function addressed- see scanned forms- and if abnormal then additional documentation follows.   Diabetes:  Using medications without difficulties:yes Hypoglycemic episodes:no Hyperglycemic episodes:prev yes, less so recently Feet problems: Numbness in the feet B.  Still can feel the floor.  No ulceration.   Blood Sugars averaging: 100-150 recently.   Labs d/w pt.   B knee pain, likely OA.  Prev injected by ortho.  He'll f/u with them.   He had been on an unrecalled prn med for swelling per renal, he'll notify us about that.   PMH and SH reviewed  Meds, vitals, and allergies reviewed.   ROS: See HPI.  Otherwise negative.    GEN: nad, alert and oriented HEENT: mucous membranes moist NECK:  supple w/o LA CV: rrr. PULM: ctab, no inc wob ABD: soft, +bs EXT: no edema SKIN: no acute rash  Diabetic foot exam: Normal inspection No skin breakdown No calluses  Normal DP pulses Dec sensation to light touch and monofilament distally.  Nails thickened.

## 2013-11-22 NOTE — Patient Instructions (Signed)
Recheck fasting labs in about 1 month.  We'll go from there.  Let me know about the medicine you had used for the swelling (from the renal clinic).  Take care.  Glad to see you.

## 2013-11-24 DIAGNOSIS — Z0000 Encounter for general adult medical examination without abnormal findings: Secondary | ICD-10-CM | POA: Insufficient documentation

## 2013-11-24 NOTE — Assessment & Plan Note (Signed)
Again discussed med use and diet changes.  Diet appears to be the main issue here, chronically.  We'll see where his A1c is later in the summer.  No change in meds today, since his numbers more recently were improved.

## 2013-11-24 NOTE — Assessment & Plan Note (Signed)
See scanned forms.  Routine anticipatory guidance given to patient.  See health maintenance. Flu 2014 Shingles 2012 PNA 1999 Tetanus 2006 Colonoscopy not due given his age.  He agrees.  Prostate cancer screening not due given his age.  He agrees.  Advance directive- wife designated if patient were incapacitated.   Cognitive function addressed- see scanned forms- and if abnormal then additional documentation follows.

## 2013-11-24 NOTE — Assessment & Plan Note (Signed)
He'll update [place] about the prn med per renal for edema.

## 2013-11-26 ENCOUNTER — Encounter: Payer: Self-pay | Admitting: Cardiology

## 2013-12-12 ENCOUNTER — Other Ambulatory Visit: Payer: Self-pay | Admitting: Family Medicine

## 2013-12-15 ENCOUNTER — Ambulatory Visit (INDEPENDENT_AMBULATORY_CARE_PROVIDER_SITE_OTHER): Payer: Medicare Other

## 2013-12-15 DIAGNOSIS — E291 Testicular hypofunction: Secondary | ICD-10-CM

## 2013-12-15 MED ORDER — TESTOSTERONE CYPIONATE 200 MG/ML IM SOLN
250.0000 mg | Freq: Once | INTRAMUSCULAR | Status: AC
  Administered 2013-12-15: 250 mg via INTRAMUSCULAR

## 2013-12-16 ENCOUNTER — Other Ambulatory Visit: Payer: Self-pay | Admitting: Family Medicine

## 2014-01-03 ENCOUNTER — Other Ambulatory Visit: Payer: Self-pay | Admitting: Family Medicine

## 2014-01-05 ENCOUNTER — Telehealth: Payer: Self-pay

## 2014-01-05 NOTE — Telephone Encounter (Signed)
Darrell Cabal RN case mgr with [facility] request most recent A1C left on confidential v/m; pt is enrolled in diabetes case mgt program. Left v/m for [name] that pts last A1C was 09/08/13 at 10.2. This is the same result given in phone note for Darrell Truman Houston on 11/17/13.

## 2014-01-18 ENCOUNTER — Ambulatory Visit (INDEPENDENT_AMBULATORY_CARE_PROVIDER_SITE_OTHER): Payer: Medicare Other | Admitting: *Deleted

## 2014-01-18 DIAGNOSIS — E291 Testicular hypofunction: Secondary | ICD-10-CM

## 2014-01-18 MED ORDER — TESTOSTERONE CYPIONATE 200 MG/ML IM SOLN
250.0000 mg | INTRAMUSCULAR | Status: DC
  Administered 2014-01-18: 250 mg via INTRAMUSCULAR

## 2014-02-17 ENCOUNTER — Ambulatory Visit (INDEPENDENT_AMBULATORY_CARE_PROVIDER_SITE_OTHER): Payer: Medicare Other | Admitting: *Deleted

## 2014-02-17 DIAGNOSIS — I428 Other cardiomyopathies: Secondary | ICD-10-CM

## 2014-02-17 DIAGNOSIS — I5022 Chronic systolic (congestive) heart failure: Secondary | ICD-10-CM

## 2014-02-17 LAB — MDC_IDC_ENUM_SESS_TYPE_REMOTE
Battery Voltage: 2.96 V
Brady Statistic AP VS Percent: 0 %
Brady Statistic AS VP Percent: 99.88 %
Brady Statistic AS VS Percent: 0.02 %
Brady Statistic RA Percent Paced: 0.09 %
Date Time Interrogation Session: 20150903052306
HIGH POWER IMPEDANCE MEASURED VALUE: 48 Ohm
HighPow Impedance: 69 Ohm
Lead Channel Impedance Value: 342 Ohm
Lead Channel Impedance Value: 380 Ohm
Lead Channel Impedance Value: 532 Ohm
Lead Channel Impedance Value: 627 Ohm
Lead Channel Impedance Value: 798 Ohm
Lead Channel Pacing Threshold Amplitude: 0.5 V
Lead Channel Pacing Threshold Amplitude: 1.125 V
Lead Channel Pacing Threshold Amplitude: 1.375 V
Lead Channel Pacing Threshold Pulse Width: 0.4 ms
Lead Channel Sensing Intrinsic Amplitude: 2 mV
Lead Channel Sensing Intrinsic Amplitude: 2 mV
Lead Channel Sensing Intrinsic Amplitude: 9.875 mV
Lead Channel Setting Pacing Amplitude: 2 V
Lead Channel Setting Pacing Pulse Width: 1 ms
Lead Channel Setting Sensing Sensitivity: 0.3 mV
MDC IDC MSMT LEADCHNL LV PACING THRESHOLD PULSEWIDTH: 1 ms
MDC IDC MSMT LEADCHNL RV PACING THRESHOLD PULSEWIDTH: 0.4 ms
MDC IDC MSMT LEADCHNL RV SENSING INTR AMPL: 9.875 mV
MDC IDC SET LEADCHNL LV PACING AMPLITUDE: 1 V
MDC IDC SET LEADCHNL RV PACING AMPLITUDE: 2.5 V
MDC IDC SET LEADCHNL RV PACING PULSEWIDTH: 0.4 ms
MDC IDC SET ZONE DETECTION INTERVAL: 350 ms
MDC IDC SET ZONE DETECTION INTERVAL: 400 ms
MDC IDC STAT BRADY AP VP PERCENT: 0.09 %
MDC IDC STAT BRADY RV PERCENT PACED: 99.97 %
Zone Setting Detection Interval: 300 ms
Zone Setting Detection Interval: 350 ms

## 2014-02-17 NOTE — Progress Notes (Signed)
Remote ICD transmission.   

## 2014-02-22 ENCOUNTER — Ambulatory Visit (INDEPENDENT_AMBULATORY_CARE_PROVIDER_SITE_OTHER): Payer: Medicare Other

## 2014-02-22 DIAGNOSIS — N529 Male erectile dysfunction, unspecified: Secondary | ICD-10-CM

## 2014-02-22 MED ORDER — TESTOSTERONE CYPIONATE 200 MG/ML IM SOLN
250.0000 mg | Freq: Once | INTRAMUSCULAR | Status: AC
  Administered 2014-02-22: 250 mg via INTRAMUSCULAR

## 2014-03-04 ENCOUNTER — Encounter: Payer: Self-pay | Admitting: Internal Medicine

## 2014-03-08 ENCOUNTER — Encounter: Payer: Self-pay | Admitting: Cardiology

## 2014-03-08 ENCOUNTER — Ambulatory Visit (INDEPENDENT_AMBULATORY_CARE_PROVIDER_SITE_OTHER): Payer: Medicare Other | Admitting: Cardiology

## 2014-03-08 VITALS — BP 142/78 | HR 72 | Ht 73.0 in | Wt 227.0 lb

## 2014-03-08 DIAGNOSIS — I25810 Atherosclerosis of coronary artery bypass graft(s) without angina pectoris: Secondary | ICD-10-CM

## 2014-03-08 NOTE — Patient Instructions (Signed)
Your physician recommends that you schedule a follow-up appointment in: one year with Dr. Hochrein  We are ordering a carotid doppler  

## 2014-03-08 NOTE — Progress Notes (Signed)
Patient ID: [name], male   DOB: [birth year], 80 y.o.   MRN: [id number]    HPI Pt with known hx of CM. Presents today for f/up. Has overall more good days than bad days. Has had some chest discomfort when laying down at night time, mostly on right side, self resolved, lasting only 5 minutes. Has not had to take nitro for at least 6 months. Has had some dizzy spells at night time X4 in the last several weeks, felt that his eyes were swimming. Does not move when this happens and seems to go away on its own. Exercise is limited 2/2 osteoarthritis but no DOE. Weights at home 222-225. Denies PND or orthopnea or palpitations or syncope. Did have an episode 2 months ago when he was given rx for 20mg  lasix X3 days for swelling in his feet. But otherwise denies fluid being up.   Allergies  Allergen Reactions  . Celecoxib     REACTION: myalgia: abdomenal pain  . Nsaids     REACTION: renal disease  . Rosuvastatin     REACTION: myalgia: musle pain    Current Outpatient Prescriptions  Medication Sig Dispense Refill  . aspirin 325 MG EC tablet Take 325 mg by mouth 2 (two) times daily.      . carvedilol (COREG) 12.5 MG tablet TAKE 1 TABLET BY MOUTH TWICE A DAY  180 tablet  3  . cholecalciferol (VITAMIN D) 1000 UNITS tablet Take 2,000 Units by mouth daily.      . insulin glargine (LANTUS) 100 UNIT/ML injection Inject 28 Units into the skin at bedtime.       [name] LANTUS SOLOSTAR 100 UNIT/ML Solostar Pen INJECT 20 UNITS SUB-QUTANEOUSLY DAILY  15 pen  6  . lisinopril (PRINIVIL,ZESTRIL) 2.5 MG tablet TAKE 1 TABLET BY MOUTH DAILY  30 tablet  2  . nitroGLYCERIN (NITROSTAT) 0.4 MG SL tablet Place 1 tablet (0.4 mg total) under the tongue every 5 (five) minutes as needed.  25 tablet  6  . Nutritional Supplements (GLUCERNA SHAKE PO) Take 4 oz by mouth daily after breakfast.        . pravastatin (PRAVACHOL) 40 MG tablet TAKE 1 TABLET BY MOUTH AT BEDTIME  30 tablet  9  . ranitidine (ZANTAC) 75 MG tablet Take  75 mg by mouth as needed.        . testosterone cypionate (DEPOTESTOTERONE CYPIONATE) 200 MG/ML injection INJECT 1.25ML INTO THE MUSCLE EVERY 28 DAYS  10 mL  1   No current facility-administered medications for this visit.   PHYSICAL EXAM BP 142/78  Pulse 72  Ht 6\' 1"  (1.854 m)  Wt 227 lb (102.967 kg)  BMI 29.96 kg/m2 GENERAL:  Well appearing NECK:  Flat JVD, no bruits. no thyromegaly LUNGS:  Clear to auscultation bilaterally BACK:  No CVA tenderness CHEST:  Well healed sternotomy scar, ICD pocket intact HEART:  RRR, nml S1/2 no S3, no S4, no clicks, no rubs, no murmurs ABD:  Flat, positive bowel sounds normal in frequency in pitch, no bruits, no rebound, no guarding, no midline pulsatile mass, no hepatomegaly, no splenomegaly, small umbilical hernia EXT:  1+ DPs, trace edema bilaterally, no cyanosis no clubbing  EKG:  NSR. BiV paced with fusion beats.  03/08/2014  ASSESSMENT AND PLAN  CAD (coronary artery disease) - He has no new symptoms since stress test 6/13.Physical activity as OA will allow. Pt currently taking 650mg  of ASA and having some loose stools. Would prefer 325mg  at most given concern  for significant bleeding events. Lipids here or with PCP.   CHRONIC SYSTOLIC HEART FAILURE - s/p ICD placement. Continues to transmit q 3 months. Per report, looks okay. Pt euvolemic today. Has had some trouble titration of medications in the past. With dizziness would not increase BB any further. CKD limits ACE titration.  HTN - BP currently at goal for age. Will titrate per HF.   Dizziness- with hx of CAD wonder if there is some vertebrobasilar insufficiency vs bradycardia at night time vs dehydration. No carotid bruits appreciated on exam. Would be nice to have vitals at night when this happens. Will obtain carotid US. If persists consider CTA.   History and all data above reviewed.  Patient examined.  I agree with the findings as above.  He has no new chest pain or acute SOB.  However,  he is having episodes as described above. He has had no evidence of atrial fib on device follow up.  The patient exam reveals COR:RRR  ,  Lungs: .clear  ,  Abd: Positive bowel sounds, no rebound no guarding, Ext No edema  .  All available labs, radiology testing, previous records reviewed. Agree with documented assessment and plan. CHF:  He seems to be euvolemic.  He will continue on the meds as listed.  I did discuss diabetes control since his A1C was 10.2.  I will order Dopplers to look at his carotids given his symptoms.   Jeneen Rinks Hochrein  11:31 AM  03/08/2014

## 2014-03-09 ENCOUNTER — Encounter: Payer: Self-pay | Admitting: Internal Medicine

## 2014-03-22 ENCOUNTER — Ambulatory Visit (HOSPITAL_COMMUNITY)
Admission: RE | Admit: 2014-03-22 | Discharge: 2014-03-22 | Disposition: A | Discharge status: Home / Self Care | Payer: Medicare Other | Source: Ambulatory Visit | Attending: Cardiovascular Disease | Admitting: Cardiovascular Disease

## 2014-03-22 DIAGNOSIS — I6529 Occlusion and stenosis of unspecified carotid artery: Secondary | ICD-10-CM

## 2014-03-22 DIAGNOSIS — I25810 Atherosclerosis of coronary artery bypass graft(s) without angina pectoris: Secondary | ICD-10-CM

## 2014-03-22 DIAGNOSIS — R42 Dizziness and giddiness: Secondary | ICD-10-CM

## 2014-03-22 NOTE — Progress Notes (Signed)
Carotid Duplex Completed. Tyrik Stetzer, BS, RDMS, RVT  

## 2014-03-25 ENCOUNTER — Ambulatory Visit: Payer: Medicare Other

## 2014-03-25 ENCOUNTER — Telehealth: Payer: Self-pay | Admitting: Family Medicine

## 2014-03-25 DIAGNOSIS — E349 Endocrine disorder, unspecified: Secondary | ICD-10-CM

## 2014-03-25 MED ORDER — TESTOSTERONE CYPIONATE 200 MG/ML IM SOLN
250.0000 mg | Freq: Once | INTRAMUSCULAR | Status: AC
  Administered 2014-03-25: 250 mg via INTRAMUSCULAR

## 2014-03-25 NOTE — Telephone Encounter (Signed)
[name] came in for his testosterone injection today and questioned a $60 fee we charge when he brings in the medication.  I explained to him that it is the administration fee for the medication (insurance was billed $60, but he only has co-pay of $10).  He asked if he could do his own injections and stated he injects himself everyday for diabetes. Is testosterone something he could do himself at home? Please advise. Thank you.

## 2014-03-27 NOTE — Telephone Encounter (Signed)
I'm okay with it.  I would have him do it once here with staff, have him do it all on his own at the office to demonstrate good technique.  I wouldn't charge him for that visit.  Then have him proceed from that point on at home.

## 2014-03-28 NOTE — Telephone Encounter (Signed)
Patient notified as instructed by telephone. Patient stated that he has an appointment in November and will have the nurse work with him on giving it to himself.

## 2014-03-30 ENCOUNTER — Telehealth: Payer: Self-pay | Admitting: Family Medicine

## 2014-03-30 NOTE — Telephone Encounter (Signed)
Pt came in today stating he has problem with his rx for one touch ultra test strips.  [facility] not [facility] and pt has ran out.  the rx is wrote for 1 daily.  He stated he some time messy  up or his blood suger is out of wack and he takes his blood sugar 2x a day.  This has caused him to run out please sent another rx to [facility] [place]

## 2014-03-30 NOTE — Telephone Encounter (Signed)
Is it okay to change directions and send new script?

## 2014-03-30 NOTE — Telephone Encounter (Signed)
Please send rx for testing bid prn, h/o insulin use.  #[phone], 3rf.  Thanks.

## 2014-03-31 MED ORDER — GLUCOSE BLOOD VI STRP: 1.0000 | ORAL_STRIP | Freq: Two times a day (BID) | Status: DC | PRN

## 2014-03-31 NOTE — Telephone Encounter (Signed)
Rx sent through [facility]  

## 2014-04-08 ENCOUNTER — Encounter: Payer: Self-pay | Admitting: Cardiovascular Disease

## 2014-04-08 ENCOUNTER — Ambulatory Visit (INDEPENDENT_AMBULATORY_CARE_PROVIDER_SITE_OTHER): Payer: Medicare Other | Admitting: Cardiovascular Disease

## 2014-04-08 ENCOUNTER — Telehealth: Payer: Self-pay | Admitting: Cardiology

## 2014-04-08 VITALS — BP 150/80 | HR 69 | Ht 73.0 in | Wt 226.0 lb

## 2014-04-08 DIAGNOSIS — I25119 Atherosclerotic heart disease of native coronary artery with unspecified angina pectoris: Secondary | ICD-10-CM

## 2014-04-08 NOTE — Assessment & Plan Note (Signed)
History of CAD status post coronary artery bypass grafting 09/05/08 the LIMA to the LAD, vein graft to a diagonal branch, a marginal branch by Dr. Cyndia Bent . His EF is in the 25% range. He has had an ICD placed by Dr. Lovena Le. He recently saw Dr. Percival Spanish  and was doing well.he was awakened from sleep at approximately 2 AM with some right upper quadrant pain. He does relate having had pizza, prior to kidney and chocolate cake last night. The symptoms have been more like reflux then ischemic pain and he is currently asymptomatic. His EKG is paced. He'll followup with Dr. Percival Spanish in 6 months.his last Myoview performed/6/13 was nonischemic.

## 2014-04-08 NOTE — Patient Instructions (Signed)
Follow up in 6 months with Dr. Percival Spanish.

## 2014-04-08 NOTE — Telephone Encounter (Signed)
Received call from patient he stated he was woke up around 2:00 am this morning with severe rt sided chest pain.Stated pain over rt nipple.Stated he took NTG x 1.Stated pain lasted appox 1 1/2 hours.Stated he has a slight dull ache in rt chest at present.Spoke to DOD Dr.Berry he advised he will see patient.Appointment scheduled at 10:45 am today.Advised if pain gets worse to go to ER.

## 2014-04-08 NOTE — Progress Notes (Signed)
04/08/2014 [name]   1925/09/08  [id number]  Primary Physician Elsie Stain, MD Primary Cardiologist: Lorretta Harp MD Renae Gloss   HPI:  [name] is a delightful 80 year old moderately overweight married Caucasian male patient of Dr. Rosezella Florida and Damita Dunnings. He has a history of CAD status post coronary artery bypass grafting three-vessel/10. He has ischemic cardiac myopathy with an EF of 25% status post ICD implantation by Dr. Crissie Sickles. The problems include hypertension and hyperlipidemia. He is seen today as an add-on because of right upper quadrant pain that awakened her from sleep at 2 AM. He does relate having had pizza, right kidney and chocolate cake last night for dinner. He currently denies chest pain or shortness of breath.   Current Outpatient Prescriptions  Medication Sig Dispense Refill  . aspirin 325 MG EC tablet Take 325 mg by mouth 2 (two) times daily.      . carvedilol (COREG) 12.5 MG tablet TAKE 1 TABLET BY MOUTH TWICE A DAY  180 tablet  3  . cholecalciferol (VITAMIN D) 1000 UNITS tablet Take 2,000 Units by mouth daily.      [name] glucose blood test strip 1 each by Other route 2 (two) times daily as needed for other.  200 each  3  . insulin glargine (LANTUS) 100 UNIT/ML injection Inject 28 Units into the skin at bedtime.       [name] LANTUS SOLOSTAR 100 UNIT/ML Solostar Pen INJECT 20 UNITS SUB-QUTANEOUSLY DAILY  15 pen  6  . lisinopril (PRINIVIL,ZESTRIL) 2.5 MG tablet TAKE 1 TABLET BY MOUTH DAILY  30 tablet  2  . nitroGLYCERIN (NITROSTAT) 0.4 MG SL tablet Place 1 tablet (0.4 mg total) under the tongue every 5 (five) minutes as needed.  25 tablet  6  . Nutritional Supplements (GLUCERNA SHAKE PO) Take 4 oz by mouth daily after breakfast.        . pravastatin (PRAVACHOL) 40 MG tablet TAKE 1 TABLET BY MOUTH AT BEDTIME  30 tablet  9  . ranitidine (ZANTAC) 75 MG tablet Take 75 mg by mouth as needed.        . testosterone cypionate (DEPOTESTOTERONE  CYPIONATE) 200 MG/ML injection INJECT 1.25ML INTO THE MUSCLE EVERY 28 DAYS  10 mL  1   No current facility-administered medications for this visit.    Allergies  Allergen Reactions  . Celecoxib     REACTION: myalgia: abdomenal pain  . Nsaids     REACTION: renal disease  . Rosuvastatin     REACTION: myalgia: musle pain    History   Social History  . Marital Status: Married    Spouse Name: N/A    Number of Children: 2  . Years of Education: N/A   Occupational History  . Retired from [employer]    Social History Main Topics  . Smoking status: Former Smoker    Quit date: 06/17/1938  . Smokeless tobacco: Never Used  . Alcohol Use: Yes     Comment: Rare  . Drug Use: No  . Sexual Activity: Not on file   Other Topics Concern  . Not on file   Social History Narrative    The patient is retired.  He has been married since age        of 7.  They have 2 children.  They have grandchildren and great        grandchildren.       Review of Systems: General: negative  for chills, fever, night sweats or weight changes.  Cardiovascular: negative for chest pain, dyspnea on exertion, edema, orthopnea, palpitations, paroxysmal nocturnal dyspnea or shortness of breath Dermatological: negative for rash Respiratory: negative for cough or wheezing Urologic: negative for hematuria Abdominal: negative for nausea, vomiting, diarrhea, bright red blood per rectum, melena, or hematemesis Neurologic: negative for visual changes, syncope, or dizziness All other systems reviewed and are otherwise negative except as noted above.    Blood pressure 150/80, pulse 69, height 6\' 1"  (1.854 m), weight 226 lb (102.513 kg).  General appearance: alert and no distress Neck: no adenopathy, no carotid bruit, no JVD, supple, symmetrical, trachea midline and thyroid not enlarged, symmetric, no tenderness/mass/nodules Lungs: clear to auscultation bilaterally Heart:  regular rate and rhythm, S1, S2 normal, no murmur, click, rub or gallop Extremities: extremities normal, atraumatic, no cyanosis or edema  EKG ventricular paced rhythm at 71  ASSESSMENT AND PLAN:   CAD (coronary artery disease) History of CAD status post coronary artery bypass grafting 09/05/08 the LIMA to the LAD, vein graft to a diagonal branch, a marginal branch by Dr. Cyndia Bent . His EF is in the 25% range. He has had an ICD placed by Dr. Lovena Le. He recently saw Dr. Percival Spanish  and was doing well.he was awakened from sleep at approximately 2 AM with some right upper quadrant pain. He does relate having had pizza, prior to kidney and chocolate cake last night. The symptoms have been more like reflux then ischemic pain and he is currently asymptomatic. His EKG is paced. He'll followup with Dr. Percival Spanish in 6 months.his last Myoview performed/6/13 was nonischemic.      Lorretta Harp MD FACP,FACC,FAHA, [facility] 04/08/2014 11:25 AM

## 2014-04-08 NOTE — Telephone Encounter (Signed)
New message      Pt had chest pain this am---took a nitro and it stopped

## 2014-04-15 ENCOUNTER — Other Ambulatory Visit: Payer: Self-pay | Admitting: Family Medicine

## 2014-04-28 ENCOUNTER — Ambulatory Visit (INDEPENDENT_AMBULATORY_CARE_PROVIDER_SITE_OTHER): Payer: Medicare Other | Admitting: *Deleted

## 2014-04-28 DIAGNOSIS — E291 Testicular hypofunction: Secondary | ICD-10-CM

## 2014-04-28 DIAGNOSIS — E349 Endocrine disorder, unspecified: Secondary | ICD-10-CM

## 2014-04-28 MED ORDER — TESTOSTERONE CYPIONATE 200 MG/ML IM SOLN
250.0000 mg | Freq: Once | INTRAMUSCULAR | Status: AC
  Administered 2014-04-28: 250 mg via INTRAMUSCULAR

## 2014-05-13 ENCOUNTER — Ambulatory Visit (INDEPENDENT_AMBULATORY_CARE_PROVIDER_SITE_OTHER): Payer: Medicare Other | Admitting: Physician Assistant

## 2014-05-13 ENCOUNTER — Ambulatory Visit: Payer: Medicare Other | Admitting: Family Medicine

## 2014-05-13 ENCOUNTER — Encounter: Payer: Self-pay | Admitting: Physician Assistant

## 2014-05-13 VITALS — BP 130/70 | HR 72 | Temp 97.9°F | Wt 223.0 lb

## 2014-05-13 DIAGNOSIS — L03211 Cellulitis of face: Secondary | ICD-10-CM

## 2014-05-13 DIAGNOSIS — L0201 Cutaneous abscess of face: Secondary | ICD-10-CM

## 2014-05-13 DIAGNOSIS — B356 Tinea cruris: Secondary | ICD-10-CM

## 2014-05-13 MED ORDER — NYSTATIN 100000 UNIT/GM EX CREA: 1.0000 "application " | TOPICAL_CREAM | Freq: Two times a day (BID) | CUTANEOUS | Status: DC

## 2014-05-13 MED ORDER — DOXYCYCLINE HYCLATE 100 MG PO TABS: 100.0000 mg | ORAL_TABLET | Freq: Two times a day (BID) | ORAL | Status: DC

## 2014-05-13 MED ORDER — NYSTATIN 100000 UNIT/GM EX POWD: CUTANEOUS | Status: DC

## 2014-05-13 NOTE — Progress Notes (Signed)
Patient presents to clinic today c/o erythematous, red and swollen region of L face that has been present over the past week. Denies trauma or injury. Denies fever, chills, malaise. Is tender to the touch.  Patient endorses he has been "picking" at it a lot.  Denies history of abscess or MRSA.  Patient also complains of itchy and burning in R inguinal region.  Does endorse some occasional urinary dribbling.  Denies trauma or injury.  Past Medical History  Diagnosis Date  . HTN (hypertension) (06/17/1981)  . Diverticula of colon (06/18/1983)  . Renal insufficiency   . Hyperlipidemia 06/17/1993)  . Diabetes mellitus  (06/17/1997)  . PUD (peptic ulcer disease) 1960's  . Pancreatitis 01/24 - 07/13/08    ABD CT  fatty liver early pancreatitis  07/12/2008  Pelvic CT Nml 07/12/2008  . Cardiomyopathy     EF 25% Ant Ak[employer] Global Hypo 3/2-08/18/08  . Thyroid nodule     per Dr. Brantley Stage  . Renal disease     per Dr. Posey Pronto  . Hypogonadism male     Current Outpatient Prescriptions on File Prior to Visit  Medication Sig Dispense Refill  . aspirin 325 MG EC tablet Take 325 mg by mouth 2 (two) times daily.    . carvedilol (COREG) 12.5 MG tablet TAKE 1 TABLET BY MOUTH TWICE A DAY 180 tablet 3  . cholecalciferol (VITAMIN D) 1000 UNITS tablet Take 2,000 Units by mouth daily.    [name] glucose blood test strip 1 each by Other route 2 (two) times daily as needed for other. 200 each 3  . insulin glargine (LANTUS) 100 UNIT/ML injection Inject 28 Units into the skin at bedtime.     [name] LANTUS SOLOSTAR 100 UNIT/ML Solostar Pen INJECT 20 UNITS SUB-QUTANEOUSLY DAILY 15 pen 6  . lisinopril (PRINIVIL,ZESTRIL) 2.5 MG tablet TAKE 1 TABLET BY MOUTH DAILY 30 tablet 2  . nitroGLYCERIN (NITROSTAT) 0.4 MG SL tablet Place 1 tablet (0.4 mg total) under the tongue every 5 (five) minutes as needed. 25 tablet 6  . Nutritional Supplements (GLUCERNA SHAKE PO) Take 4 oz by mouth daily after breakfast.      . pravastatin (PRAVACHOL)  40 MG tablet TAKE 1 TABLET BY MOUTH AT BEDTIME 30 tablet 8  . ranitidine (ZANTAC) 75 MG tablet Take 75 mg by mouth as needed.      . testosterone cypionate (DEPOTESTOTERONE CYPIONATE) 200 MG/ML injection INJECT 1.25ML INTO THE MUSCLE EVERY 28 DAYS 10 mL 1   No current facility-administered medications on file prior to visit.    Allergies  Allergen Reactions  . Celecoxib     REACTION: myalgia: abdomenal pain  . Nsaids     REACTION: renal disease  . Rosuvastatin     REACTION: myalgia: musle pain    Family History  Problem Relation Age of Onset  . Angina Father   . Hypertension Father   . Heart disease Father     MI, angina  . Breast cancer Mother   . Cancer Mother     Breast  . Heart disease Mother     Valve disease  . Cancer Brother     Brain tumor  . Colon cancer Neg Hx   . Prostate cancer Neg Hx     History   Social History  . Marital Status: Married    Spouse Name: N/A    Number of Children: 2  . Years of Education: N/A   Occupational History  . 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 Smoking status: Former Smoker    Quit date: 06/17/1938  . Smokeless tobacco: Never Used  . Alcohol Use: Yes     Comment: Rare  . Drug Use: No  . Sexual Activity: None   Other Topics Concern  . None   Social History Narrative    The patient is retired.  He has been married since age        of 41.  They have 2 children.  They have grandchildren and great        grandchildren.      Review of Systems - See HPI.  All other ROS are negative.  BP 130/70 mmHg  Pulse 72  Temp(Src) 97.9 F (36.6 C) (Oral)  Wt 223 lb (101.152 kg)  SpO2 96%  Physical Exam  Constitutional: He is oriented to person, place, and time and well-developed, well-nourished, and in no distress.  HENT:  Head: Normocephalic and atraumatic.  Eyes: Conjunctivae are normal. Pupils are equal, round, and reactive to light.  Neck: Neck supple.    Cardiovascular: Normal rate, regular rhythm, normal heart sounds and intact distal pulses.   Pulmonary/Chest: Effort normal and breath sounds normal. No respiratory distress. He has no wheezes. He has no rales. He exhibits no tenderness.  Neurological: He is alert and oriented to person, place, and time.  Skin:     Nursing note and vitals reviewed.   Recent Results (from the past 2160 hour(s))  Implantable device - remote     Status: None   Collection Time: 02/17/14  5:23 AM  Result Value Ref Range   Date Time Interrogation Session [id number]    Pulse Generator Manufacturer Medtronic    Pulse Gen Model [id number] Consulta CRT-D    Pulse Gen Serial Number [id number]    RV Sense Sensitivity 0.3 mV   RA Pace Amplitude 2 V   RV Pace PulseWidth 0.4 ms   RV Pace Amplitude 2.5 V   LV Pace PulseWidth 1 ms   LV Pace Amplitude 1 V   LV Pace Capture Mode Fixed Pacing    Zone Setting Type Category VF    Zone Detect Interval 300 ms   Zone Setting Type Category VT    Zone Detect Interval Blank    Zone Setting Type Category VENTRICULAR_TACHYCARDIA_1    Zone Detect Interval 350 ms   Zone Setting Type Category VENTRICULAR_TACHYCARDIA_2    Zone Detect Interval 400 ms   Zone Setting Type Category ATRIAL_FIBRILLATION    Zone Setting Type Category ATAF    Zone Detect Interval 350 ms   RA Impedance 532 ohm   RA Amplitude 2 mV   RA Amplitude 2 mV   RA Pacing Amplitude 1.375 V   RA Pacing PulseWidth 0.4 ms   RV IMPEDANCE 380 ohm   RV Amplitude 9.875 mV   RV Amplitude 9.875 mV   RV Pacing Amplitude 1.125 V   RV Pacing PulseWidth 0.4 ms   HighPow Impedance 48 ohm   HighPow Impedance 69 ohm   LV Impedance 798 ohm   LV Impedance 342 ohm   LV Impedance 627 ohm   LV Pacing Amplitude 0.5 V   LV PACING PULSEWIDTH 1 ms   Battery Status OK    Battery Voltage 2.96 V   Brady RA Perc Paced 0.09 %   Brady RV Perc Paced 99.97 %   Brady AP VP Percent 0.09 %   Brady AS VP Percent 99.88 %  Brady  AP VS Percent 0 %   Brady AS VS Percent 0.02 %   Eval Rhythm ASBP    Miscellaneous Comment      Remote CRT-D device check. Thresholds and sensing consistent with previous device measurements. Lead impedance trends stable over time. No mode switch episodes recorded. No ventricular arrhythmia episodes recorded. Patient bi-ventricularly pacing 99.9%  of the time. Device programmed with appropriate safety margins. Heart failure diagnostics reviewed and trends are stable for patient. Batt voltage 2.96V (ERI 2.63V). Plan to follow up via [facility] on 12-7 and with [facility] in 08-2014.    Assessment/Plan: Jock itch Rx nystatin cream to apply BID x 2 weeks.  Keep area clean and dry.  Rx Nystatin powder to apply after acute infection is resolved, at the start of any redness as this will help keep area dry and prevent infection.  Return precautions discussed with patient.  Cellulitis and abscess of face Rx doxycycline.  Hygiene measures discussed. Follow-up with PCP in 1 week.  If symptoms worsen while on antibiotic, please return to office or proceed to ER.

## 2014-05-13 NOTE — Assessment & Plan Note (Signed)
Rx nystatin cream to apply BID x 2 weeks.  Keep area clean and dry.  Rx Nystatin powder to apply after acute infection is resolved, at the start of any redness as this will help keep area dry and prevent infection.  Return precautions discussed with patient.

## 2014-05-13 NOTE — Progress Notes (Signed)
Pre visit review using our clinic review tool, if applicable. No additional management support is needed unless otherwise documented below in the visit note. 

## 2014-05-13 NOTE — Patient Instructions (Signed)
Please take antibiotic as directed until all tablets are gone.  Keep face clean and dry.  If you develop fever, chills, worsening of the area on your face, please return to clinic ASAP.  Otherwise follow-up in 1 week.  For the jock itch, please use cream as directed for 1-2 weeks.  Keep the groin clean and dry.  Once healed, you can use the powder given to help keep the area dry and prevent recurrence of infection.  Cellulitis Cellulitis is an infection of the skin and the tissue beneath it. The infected area is usually red and tender. Cellulitis occurs most often in the arms and lower legs.  CAUSES  Cellulitis is caused by bacteria that enter the skin through cracks or cuts in the skin. The most common types of bacteria that cause cellulitis are staphylococci and streptococci. SIGNS AND SYMPTOMS   Redness and warmth.  Swelling.  Tenderness or pain.  Fever. DIAGNOSIS  Your health care provider can usually determine what is wrong based on a physical exam. Blood tests may also be done. TREATMENT  Treatment usually involves taking an antibiotic medicine. HOME CARE INSTRUCTIONS   Take your antibiotic medicine as directed by your health care provider. Finish the antibiotic even if you start to feel better.  Keep the infected arm or leg elevated to reduce swelling.  Apply a warm cloth to the affected area up to 4 times per day to relieve pain.  Take medicines only as directed by your health care provider.  Keep all follow-up visits as directed by your health care provider. SEEK MEDICAL CARE IF:   You notice red streaks coming from the infected area.  Your red area gets larger or turns dark in color.  Your bone or joint underneath the infected area becomes painful after the skin has healed.  Your infection returns in the same area or another area.  You notice a swollen bump in the infected area.  You develop new symptoms.  You have a fever. SEEK IMMEDIATE MEDICAL CARE IF:    You feel very sleepy.  You develop vomiting or diarrhea.  You have a general ill feeling (malaise) with muscle aches and pains. MAKE SURE YOU:   Understand these instructions.  Will watch your condition.  Will get help right away if you are not doing well or get worse. Document Released: 03/13/2005 Document Revised: 10/18/2013 Document Reviewed: 08/19/2011 [facility] Patient Information 2015 [place], [place]. This information is not intended to replace advice given to you by your health care provider. Make sure you discuss any questions you have with your health care provider.

## 2014-05-13 NOTE — Assessment & Plan Note (Signed)
Rx doxycycline.  Hygiene measures discussed. Follow-up with PCP in 1 week.  If symptoms worsen while on antibiotic, please return to office or proceed to ER.

## 2014-05-17 ENCOUNTER — Encounter: Payer: Self-pay | Admitting: Family Medicine

## 2014-05-17 LAB — GLUCOSE (CC13)
Creatinine, Ser: 2.86
EGFR: 19 mg/dL
Glucose: 212

## 2014-05-23 ENCOUNTER — Encounter: Payer: Self-pay | Admitting: Internal Medicine

## 2014-05-23 ENCOUNTER — Ambulatory Visit (INDEPENDENT_AMBULATORY_CARE_PROVIDER_SITE_OTHER): Payer: Medicare Other | Admitting: *Deleted

## 2014-05-23 ENCOUNTER — Telehealth: Payer: Self-pay | Admitting: Cardiology

## 2014-05-23 DIAGNOSIS — I5022 Chronic systolic (congestive) heart failure: Secondary | ICD-10-CM

## 2014-05-23 DIAGNOSIS — I255 Ischemic cardiomyopathy: Secondary | ICD-10-CM

## 2014-05-23 NOTE — Progress Notes (Signed)
Remote ICD transmission.   

## 2014-05-23 NOTE — Telephone Encounter (Signed)
Spoke with pt and reminded pt of remote transmission that is due today. Pt verbalized understanding.   

## 2014-05-27 LAB — MDC_IDC_ENUM_SESS_TYPE_REMOTE
Battery Voltage: 2.91 V
Brady Statistic AP VP Percent: 0.3 %
Brady Statistic AS VP Percent: 99.63 %
Brady Statistic RA Percent Paced: 0.3 %
Brady Statistic RV Percent Paced: 99.93 %
Date Time Interrogation Session: 20151207204334
HighPow Impedance: 51 Ohm
HighPow Impedance: 73 Ohm
Lead Channel Impedance Value: 380 Ohm
Lead Channel Impedance Value: 627 Ohm
Lead Channel Pacing Threshold Amplitude: 1.625 V
Lead Channel Pacing Threshold Pulse Width: 0.4 ms
Lead Channel Pacing Threshold Pulse Width: 1 ms
Lead Channel Sensing Intrinsic Amplitude: 2.25 mV
Lead Channel Sensing Intrinsic Amplitude: 26.375 mV
Lead Channel Setting Pacing Amplitude: 1 V
Lead Channel Setting Pacing Amplitude: 2 V
Lead Channel Setting Pacing Amplitude: 3 V
Lead Channel Setting Pacing Pulse Width: 1 ms
Lead Channel Setting Sensing Sensitivity: 0.3 mV
MDC IDC MSMT LEADCHNL LV IMPEDANCE VALUE: 380 Ohm
MDC IDC MSMT LEADCHNL LV IMPEDANCE VALUE: 855 Ohm
MDC IDC MSMT LEADCHNL LV PACING THRESHOLD AMPLITUDE: 0.5 V
MDC IDC MSMT LEADCHNL RA IMPEDANCE VALUE: 589 Ohm
MDC IDC MSMT LEADCHNL RA PACING THRESHOLD PULSEWIDTH: 0.4 ms
MDC IDC MSMT LEADCHNL RV PACING THRESHOLD AMPLITUDE: 1.25 V
MDC IDC SET LEADCHNL RV PACING PULSEWIDTH: 0.4 ms
MDC IDC SET ZONE DETECTION INTERVAL: 350 ms
MDC IDC STAT BRADY AP VS PERCENT: 0 %
MDC IDC STAT BRADY AS VS PERCENT: 0.07 %
Zone Setting Detection Interval: 300 ms
Zone Setting Detection Interval: 350 ms
Zone Setting Detection Interval: 400 ms

## 2014-05-31 ENCOUNTER — Ambulatory Visit (INDEPENDENT_AMBULATORY_CARE_PROVIDER_SITE_OTHER): Payer: Medicare Other | Admitting: *Deleted

## 2014-05-31 DIAGNOSIS — E349 Endocrine disorder, unspecified: Secondary | ICD-10-CM

## 2014-05-31 DIAGNOSIS — E291 Testicular hypofunction: Secondary | ICD-10-CM

## 2014-05-31 MED ORDER — TESTOSTERONE CYPIONATE 200 MG/ML IM SOLN
250.0000 mg | INTRAMUSCULAR | Status: DC
  Administered 2014-05-31: 250 mg via INTRAMUSCULAR

## 2014-06-03 ENCOUNTER — Encounter: Payer: Self-pay | Admitting: Cardiology

## 2014-06-14 ENCOUNTER — Other Ambulatory Visit: Payer: Self-pay | Admitting: Family Medicine

## 2014-06-14 ENCOUNTER — Ambulatory Visit: Payer: Medicare Other | Admitting: Family Medicine

## 2014-06-15 ENCOUNTER — Encounter: Payer: Self-pay | Admitting: Family Medicine

## 2014-06-15 ENCOUNTER — Ambulatory Visit (INDEPENDENT_AMBULATORY_CARE_PROVIDER_SITE_OTHER): Payer: Medicare Other | Admitting: Family Medicine

## 2014-06-15 VITALS — BP 118/68 | HR 70 | Temp 97.8°F | Wt 220.5 lb

## 2014-06-15 DIAGNOSIS — N183 Chronic kidney disease, stage 3 unspecified: Secondary | ICD-10-CM

## 2014-06-15 DIAGNOSIS — E1129 Type 2 diabetes mellitus with other diabetic kidney complication: Secondary | ICD-10-CM

## 2014-06-15 DIAGNOSIS — E1165 Type 2 diabetes mellitus with hyperglycemia: Secondary | ICD-10-CM

## 2014-06-15 DIAGNOSIS — IMO0002 Reserved for concepts with insufficient information to code with codable children: Secondary | ICD-10-CM

## 2014-06-15 NOTE — Progress Notes (Signed)
Pre visit review using our clinic review tool, if applicable. No additional management support is needed unless otherwise documented below in the visit note.  More ankle swelling recently, R>L usually, some better today.  He has noted some more sensation changes recently.  No skin breakdown but some distal R foot pain, at the 4th toe, at night.  No redness or discharge.  He had been on 20mg  lasix episodically prev per renal, with h/o CKD noted, not on lasix recently.  On baseline meds at home, no changes other than cutting out ASA due to some stomach upset/abd pain, and he is improved some in the meantime.    Dm2.  Sugar has been as low as 90, up to 170s in AM. Due for DM2 labs.  No recent check >200s on home checks.  Compliant with meds.  I have stressed DM 2 diet to patient prev.  Exercise is limited.   Meds, vitals, and allergies reviewed.   ROS: See HPI.  Otherwise, noncontributory.  GEN: nad, alert and oriented HEENT: mucous membranes moist NECK: supple w/o LA CV: rrr.   PULM: ctab, no inc wob ABD: soft, +bs EXT: 1+ BLE (R>L) edema SKIN: no acute rash  Diabetic foot exam: Normal inspection No skin breakdown but irritation between the toes noted.   No frank calluses but some skin thickening noted 1+ DP pulses Dec sensation to light touch and monofilament on the distal toes B

## 2014-06-15 NOTE — Patient Instructions (Signed)
Don't change your meds for now.  Go to the lab on the way out.  We'll contact you with your lab report. Use some OTC antifungal cream on your feet and especially between your toes.  Take care.

## 2014-06-16 ENCOUNTER — Telehealth: Payer: Self-pay

## 2014-06-16 LAB — BASIC METABOLIC PANEL
BUN: 33 mg/dL — AB (ref 6–23)
CALCIUM: 8.5 mg/dL (ref 8.4–10.5)
CO2: 23 mEq/L (ref 19–32)
Chloride: 105 mEq/L (ref 96–112)
Creatinine, Ser: 2.9 mg/dL — ABNORMAL HIGH (ref 0.4–1.5)
GFR: 21.84 mL/min — ABNORMAL LOW (ref >60.00)
GLUCOSE: 221 mg/dL — AB (ref 70–99)
POTASSIUM: 5.6 meq/L — AB (ref 3.5–5.1)
Sodium: 135 mEq/L (ref 135–145)

## 2014-06-16 LAB — HEMOGLOBIN A1C: Hgb A1c MFr Bld: 10.1 % — ABNORMAL HIGH (ref 4.6–6.5)

## 2014-06-16 LAB — LDL CHOLESTEROL, DIRECT: Direct LDL: 74.1 mg/dL

## 2014-06-16 MED ORDER — FUROSEMIDE 20 MG PO TABS: 20.0000 mg | ORAL_TABLET | Freq: Every day | ORAL | Status: DC | PRN

## 2014-06-16 NOTE — Telephone Encounter (Signed)
PLEASE NOTE: All timestamps contained within this report are represented as [place] Standard Time. CONFIDENTIALTY NOTICE: This fax transmission is intended only for the addressee. It contains information that is legally privileged, confidential or otherwise protected from use or disclosure. If you are not the intended recipient, you are strictly prohibited from reviewing, disclosing, copying using or disseminating any of this information or taking any action in reliance on or regarding this information. If you have received this fax in error, please notify us immediately by telephone so that we can arrange for its return to [place]. Phone: [phone], Toll-Free: [phone], Fax: [phone] Page: 1 of 2 Call Id: [id number] [name] - Client [place] Patient Name: [name] Gender: Male DOB: [birth year] Age: 80 Y 88 M 6 D Return Phone Number: [phone] (Primary), [phone] (Secondary) Address: [address] City/State/Zip: [place] [address] Client [name] - Client Client Site [place] [facility] [place] Physician Renford Dills Contact Type Call Call Type Triage / Clinical Caller Name [name] Relationship To Patient Spouse Return Phone Number [phone] (Primary) Chief Complaint Feet swelling Initial Comment Caller states husband's feet are swelling, is diabetic, needs something to get rid of the fluid. PreDisposition Call Doctor Nurse Assessment Nurse: Marcelline Deist, RN, Kermit Balo Date/Time Eilene Ghazi Time): 06/16/2014 12:06:48 PM Confirm and document reason for call. If symptomatic, describe symptoms. ---Caller states husband's feet are swelling, is diabetic, needs something to get rid of the fluid. Had a diuretic a few weeks ago through his kidney Dr. Lequita Halt yesterday by Dr. Damita Dunnings, did not order anything. This is the second occurence, somewhat tender around  toe. Both are swollen, right is worse. Has the patient traveled out of the country within the last 30 days? ---Not Applicable Does the patient require triage? ---Yes Related visit to physician within the last 2 weeks? ---Yes Does the PT have any chronic conditions? (i.e. diabetes, asthma, etc.) ---Yes List chronic conditions. ---diabetes, kidney partially removed Guidelines Guideline Title Affirmed Question Affirmed Notes Nurse Date/Time Eilene Ghazi Time) Leg Swelling and Edema [1] MILD swelling of both ankles (i.e., pedal edema) AND [2] new onset or worsening Marcelline Deist, RN, Kermit Balo 06/16/2014 12:11:23 PM Disp. Time Eilene Ghazi Time) Disposition Final User 06/16/2014 11:48:51 AM Attempt made - line busy [place], [place], [place] 06/16/2014 12:23:24 PM Attempt made - line busy Marcelline Deist, RN, [place] 06/16/2014 12:55:42 PM Call Completed Marcelline Deist, RN, [place] 06/16/2014 12:19:36 PM See Physician within 24 Hours Yes Marcelline Deist, RN, Kermit Balo Disposition Overriden: See PCP When Office is Open (within 3 days) PLEASE NOTE: All timestamps contained within this report are represented as [place] Standard Time. CONFIDENTIALTY NOTICE: This fax transmission is intended only for the addressee. It contains information that is legally privileged, confidential or otherwise protected from use or disclosure. If you are not the intended recipient, you are strictly prohibited from reviewing, disclosing, copying using or disseminating any of this information or taking any action in reliance on or regarding this information. If you have received this fax in error, please notify us immediately by telephone so that we can arrange for its return to [place]. Phone: [phone], Toll-Free: [phone], Fax: [phone] Page: 2 of 2 Call Id: [id number] [place] Reason: Patient's symptoms need a higher level of care Caller Understands: Yes Disagree/Comply: Disagree Disagree/Comply Reason: Disagree with instructions Care Advice Given Per  Guideline SEE PCP WITHIN 3 DAYS: You need to be examined within 2 or 3 days. Call your doctor during regular office hours  and make an appointment. (Note: if office will be open tomorrow, tell caller to call then, not in 3 days). CARE ADVICE given per Leg Swelling and Edema (Adult) guideline. * You become worse. LEG SWELLING AND/OR EDEMA: * Elevate your legs or try to lay down once or twice daily for 20 minutes. CALL BACK IF: * Swelling becomes worse * Swelling becomes red or painful to the touch * Calf pain occurs and becomes constant * Avoid socks with an elastic band at the top. Wear comfortable shoes. SEE PHYSICIAN WITHIN 24 HOURS: * IF OFFICE WILL BE OPEN: You need to be examined within the next 24 hours. Call your doctor when the office opens, and make an appointment. * You become worse. * Fever occurs CALL BACK IF: LEG SWELLING AND/OR EDEMA: * Elevate your legs or try to lay down once or twice daily for 20 minutes. CALL BACK IF: * Breathing difficulty or chest pain occurs * You become worse. After Care Instructions Given Call Event Type User Date / Time Description Comments User: Donald Siva, RN Date/Time Eilene Ghazi Time): 06/16/2014 12:20:54 PM Caller uses [facility] [phone]. NKDA. Not sure, but pt. thinks he may have taken Lasix previously. Requesting a diuretic. Nurse will page on-call. User: Donald Siva, RN Date/Time Eilene Ghazi Time): 06/16/2014 12:55:34 PM Nurse does not have access to on-call physician. When informing caller, she reports that her husband found rx that Dr. Posey Pronto had ordered for him, the diuretic. Referrals REFERRED TO PCP OFFICE

## 2014-06-16 NOTE — Telephone Encounter (Signed)
Patient notified and verbalized understanding. 

## 2014-06-16 NOTE — Assessment & Plan Note (Signed)
With less edema today.  I didn't rx lasix yet, I am awaiting BMET. He may be able to tolerate 20mg  qd prn.  D/w pt.

## 2014-06-16 NOTE — Telephone Encounter (Signed)
Reviewed last Cr. Lab Results  Component Value Date   CREATININE 2.9* 06/15/2014  h/o CKD stage 2, Cr this high for several years.  Ok to try lasix 20mg  qd prn, ordered #30. Would want him to only use sparingly. Will also route to PCP.

## 2014-06-16 NOTE — Assessment & Plan Note (Signed)
Now with likely neuropathy changes.  He has had high A1c prev, with diet being the main issue.  D/w pt again, continue current meds for now, await A1c.  >25 minutes spent in face to face time with patient, >50% spent in counselling or coordination of care.

## 2014-06-17 ENCOUNTER — Other Ambulatory Visit: Payer: Self-pay | Admitting: Family Medicine

## 2014-06-18 NOTE — Telephone Encounter (Signed)
Noted, thanks!

## 2014-06-28 ENCOUNTER — Other Ambulatory Visit: Payer: Self-pay | Admitting: Cardiology

## 2014-06-29 ENCOUNTER — Other Ambulatory Visit: Payer: Self-pay | Admitting: Family Medicine

## 2014-06-29 NOTE — Telephone Encounter (Signed)
Received refill request electronically from pharmacy. Last refill 11/03/13 #10 ml/ 1 refill, last office visit 06/15/14. Is it okay to refill medication?

## 2014-06-30 NOTE — Telephone Encounter (Signed)
Printed.  Thanks.  

## 2014-06-30 NOTE — Telephone Encounter (Signed)
Wife advised.  Rx left at front desk for pick up.  

## 2014-07-01 ENCOUNTER — Ambulatory Visit (INDEPENDENT_AMBULATORY_CARE_PROVIDER_SITE_OTHER): Payer: Medicare Other | Admitting: *Deleted

## 2014-07-01 ENCOUNTER — Other Ambulatory Visit: Payer: Self-pay | Admitting: Family Medicine

## 2014-07-01 DIAGNOSIS — E291 Testicular hypofunction: Secondary | ICD-10-CM | POA: Diagnosis not present

## 2014-07-01 DIAGNOSIS — N184 Chronic kidney disease, stage 4 (severe): Secondary | ICD-10-CM | POA: Diagnosis not present

## 2014-07-01 DIAGNOSIS — E349 Endocrine disorder, unspecified: Secondary | ICD-10-CM

## 2014-07-01 DIAGNOSIS — N189 Chronic kidney disease, unspecified: Secondary | ICD-10-CM | POA: Diagnosis not present

## 2014-07-01 DIAGNOSIS — N2581 Secondary hyperparathyroidism of renal origin: Secondary | ICD-10-CM | POA: Diagnosis not present

## 2014-07-01 MED ORDER — TESTOSTERONE CYPIONATE 200 MG/ML IM SOLN
250.0000 mg | INTRAMUSCULAR | Status: DC
  Administered 2014-07-01: 250 mg via INTRAMUSCULAR

## 2014-07-15 ENCOUNTER — Telehealth: Payer: Self-pay

## 2014-07-15 NOTE — Telephone Encounter (Signed)
Sydnee Cabal RN case mgr with [facility]; pt is in diabetes program with [employer];Peggy request pts last A1C and date,and lst BP with the date. Left v/m for Peggy to fax written request of med records needed along with signed permission from pt to release info.

## 2014-07-20 DIAGNOSIS — N184 Chronic kidney disease, stage 4 (severe): Secondary | ICD-10-CM | POA: Diagnosis not present

## 2014-07-20 DIAGNOSIS — N2581 Secondary hyperparathyroidism of renal origin: Secondary | ICD-10-CM | POA: Diagnosis not present

## 2014-07-20 DIAGNOSIS — D631 Anemia in chronic kidney disease: Secondary | ICD-10-CM | POA: Diagnosis not present

## 2014-07-20 DIAGNOSIS — I10 Essential (primary) hypertension: Secondary | ICD-10-CM | POA: Diagnosis not present

## 2014-07-21 ENCOUNTER — Encounter: Payer: Self-pay | Admitting: Internal Medicine

## 2014-07-22 DIAGNOSIS — L84 Corns and callosities: Secondary | ICD-10-CM | POA: Diagnosis not present

## 2014-07-22 DIAGNOSIS — L602 Onychogryphosis: Secondary | ICD-10-CM | POA: Diagnosis not present

## 2014-07-22 DIAGNOSIS — E1151 Type 2 diabetes mellitus with diabetic peripheral angiopathy without gangrene: Secondary | ICD-10-CM | POA: Diagnosis not present

## 2014-07-27 ENCOUNTER — Telehealth: Payer: Self-pay | Admitting: Cardiology

## 2014-07-27 NOTE — Telephone Encounter (Signed)
Dr. Rosezella [place] advice communicated to patient, he voiced understanding.

## 2014-07-27 NOTE — Telephone Encounter (Signed)
Pt wants to know if he can take Cialis 2.5 mg?

## 2014-07-27 NOTE — Telephone Encounter (Signed)
I reviewed the meds and don't see any contraindication.

## 2014-07-27 NOTE — Telephone Encounter (Signed)
Recently saw his nephrologist who prescribed Cialis for him.  He wanted to check w/ Dr. Percival Spanish to see if this was OK to use.  I asked if he was still using the [facility], he states last use >6 months ago.

## 2014-07-28 NOTE — Telephone Encounter (Signed)
Sydnee Cabal RN left same v/m as noted in 07/15/14 phone note; called Ms [name] back and left same v/m that she needs to send written request of med records needed along with signed permission from the pt to release requested records.

## 2014-08-01 ENCOUNTER — Other Ambulatory Visit: Payer: Self-pay | Admitting: Family Medicine

## 2014-08-01 NOTE — Telephone Encounter (Signed)
Electronic refill request. Last Filled:    10 mL 0 06/30/2014  Please advise.

## 2014-08-01 NOTE — Telephone Encounter (Signed)
It looks like this was done last month.  Please clarify, thanks.

## 2014-08-03 ENCOUNTER — Ambulatory Visit: Payer: Medicare Other

## 2014-08-05 ENCOUNTER — Other Ambulatory Visit: Payer: Self-pay | Admitting: Family Medicine

## 2014-08-05 ENCOUNTER — Telehealth: Payer: Self-pay | Admitting: Family Medicine

## 2014-08-05 MED ORDER — NITROGLYCERIN 0.4 MG SL SUBL: 0.4000 mg | SUBLINGUAL_TABLET | SUBLINGUAL | Status: DC | PRN

## 2014-08-05 NOTE — Telephone Encounter (Signed)
Pt left note requesting refill nitroglycerin to [facility]; pt is out of med; unable to reach pt by phone but spoke with Katharine Look and advised her I spoke with [facility] and rx is not ready yet but is working on now and pt can call before going but should be able to get rx picked up today. Katharine Look voiced understanding and will let pt know.

## 2014-08-05 NOTE — Telephone Encounter (Signed)
Patient refill request for Nitrostat.  States he only has 1-1/2 pills left.  Last Filled:    25 tablet 6 RF on 07/01/2011  Please advise.  Patient also asked for refill of Testosterone.  Patient has never picked up the last Rx from January.  Wife was advised at that time that the prescription was ready for pick up.

## 2014-08-05 NOTE — Telephone Encounter (Signed)
Pt came in today needing a refill testosteron cyp 2,000 mg/64ml   Pt is out of meds Nitrostat 0.4 mg   Pt has 1 1/2 pills left  [facility] [address]

## 2014-08-05 NOTE — Telephone Encounter (Signed)
NTG sent.  Let me know if the old rx for testosterone isn't still up front, ie hasn't been destroyed.  Thanks.

## 2014-08-05 NOTE — Telephone Encounter (Signed)
The Rx for Testosterone is still up front, I checked before I sent you the message and I left a VM on their home phone telling them it has been here since January and wife was advised.

## 2014-08-05 NOTE — Telephone Encounter (Signed)
Thanks

## 2014-08-08 ENCOUNTER — Inpatient Hospital Stay (HOSPITAL_COMMUNITY)
Admission: EM | Admit: 2014-08-08 | Discharge: 2014-08-10 | DRG: 378 | Disposition: A | Discharge status: Home / Self Care | Payer: Medicare Other | Attending: Internal Medicine | Admitting: Internal Medicine

## 2014-08-08 ENCOUNTER — Encounter (HOSPITAL_COMMUNITY): Payer: Self-pay | Admitting: *Deleted

## 2014-08-08 ENCOUNTER — Emergency Department (HOSPITAL_COMMUNITY): Payer: Medicare Other

## 2014-08-08 DIAGNOSIS — K449 Diaphragmatic hernia without obstruction or gangrene: Secondary | ICD-10-CM | POA: Diagnosis present

## 2014-08-08 DIAGNOSIS — Z7982 Long term (current) use of aspirin: Secondary | ICD-10-CM

## 2014-08-08 DIAGNOSIS — Z85528 Personal history of other malignant neoplasm of kidney: Secondary | ICD-10-CM

## 2014-08-08 DIAGNOSIS — E1129 Type 2 diabetes mellitus with other diabetic kidney complication: Secondary | ICD-10-CM | POA: Diagnosis not present

## 2014-08-08 DIAGNOSIS — N184 Chronic kidney disease, stage 4 (severe): Secondary | ICD-10-CM | POA: Diagnosis present

## 2014-08-08 DIAGNOSIS — E1121 Type 2 diabetes mellitus with diabetic nephropathy: Secondary | ICD-10-CM | POA: Diagnosis not present

## 2014-08-08 DIAGNOSIS — N189 Chronic kidney disease, unspecified: Secondary | ICD-10-CM

## 2014-08-08 DIAGNOSIS — K2970 Gastritis, unspecified, without bleeding: Secondary | ICD-10-CM | POA: Diagnosis present

## 2014-08-08 DIAGNOSIS — E785 Hyperlipidemia, unspecified: Secondary | ICD-10-CM | POA: Diagnosis not present

## 2014-08-08 DIAGNOSIS — I255 Ischemic cardiomyopathy: Secondary | ICD-10-CM | POA: Diagnosis not present

## 2014-08-08 DIAGNOSIS — K5730 Diverticulosis of large intestine without perforation or abscess without bleeding: Secondary | ICD-10-CM | POA: Diagnosis present

## 2014-08-08 DIAGNOSIS — E1165 Type 2 diabetes mellitus with hyperglycemia: Secondary | ICD-10-CM | POA: Diagnosis not present

## 2014-08-08 DIAGNOSIS — Z6829 Body mass index (BMI) 29.0-29.9, adult: Secondary | ICD-10-CM

## 2014-08-08 DIAGNOSIS — E1122 Type 2 diabetes mellitus with diabetic chronic kidney disease: Secondary | ICD-10-CM | POA: Diagnosis present

## 2014-08-08 DIAGNOSIS — K5731 Diverticulosis of large intestine without perforation or abscess with bleeding: Secondary | ICD-10-CM

## 2014-08-08 DIAGNOSIS — D62 Acute posthemorrhagic anemia: Secondary | ICD-10-CM

## 2014-08-08 DIAGNOSIS — E669 Obesity, unspecified: Secondary | ICD-10-CM | POA: Diagnosis not present

## 2014-08-08 DIAGNOSIS — I5022 Chronic systolic (congestive) heart failure: Secondary | ICD-10-CM | POA: Diagnosis not present

## 2014-08-08 DIAGNOSIS — R739 Hyperglycemia, unspecified: Secondary | ICD-10-CM

## 2014-08-08 DIAGNOSIS — C649 Malignant neoplasm of unspecified kidney, except renal pelvis: Secondary | ICD-10-CM | POA: Diagnosis present

## 2014-08-08 DIAGNOSIS — I10 Essential (primary) hypertension: Secondary | ICD-10-CM | POA: Diagnosis not present

## 2014-08-08 DIAGNOSIS — K921 Melena: Secondary | ICD-10-CM | POA: Diagnosis not present

## 2014-08-08 DIAGNOSIS — I2510 Atherosclerotic heart disease of native coronary artery without angina pectoris: Secondary | ICD-10-CM | POA: Diagnosis present

## 2014-08-08 DIAGNOSIS — Z95810 Presence of automatic (implantable) cardiac defibrillator: Secondary | ICD-10-CM | POA: Diagnosis not present

## 2014-08-08 DIAGNOSIS — E349 Endocrine disorder, unspecified: Secondary | ICD-10-CM | POA: Diagnosis present

## 2014-08-08 DIAGNOSIS — K625 Hemorrhage of anus and rectum: Secondary | ICD-10-CM | POA: Diagnosis not present

## 2014-08-08 DIAGNOSIS — I129 Hypertensive chronic kidney disease with stage 1 through stage 4 chronic kidney disease, or unspecified chronic kidney disease: Secondary | ICD-10-CM | POA: Diagnosis not present

## 2014-08-08 DIAGNOSIS — K2980 Duodenitis without bleeding: Secondary | ICD-10-CM | POA: Diagnosis present

## 2014-08-08 DIAGNOSIS — Z794 Long term (current) use of insulin: Secondary | ICD-10-CM | POA: Diagnosis not present

## 2014-08-08 DIAGNOSIS — IMO0002 Reserved for concepts with insufficient information to code with codable children: Secondary | ICD-10-CM | POA: Diagnosis present

## 2014-08-08 DIAGNOSIS — D649 Anemia, unspecified: Secondary | ICD-10-CM | POA: Diagnosis not present

## 2014-08-08 DIAGNOSIS — K640 First degree hemorrhoids: Secondary | ICD-10-CM | POA: Diagnosis not present

## 2014-08-08 DIAGNOSIS — Z951 Presence of aortocoronary bypass graft: Secondary | ICD-10-CM

## 2014-08-08 DIAGNOSIS — K922 Gastrointestinal hemorrhage, unspecified: Secondary | ICD-10-CM | POA: Diagnosis not present

## 2014-08-08 DIAGNOSIS — K5900 Constipation, unspecified: Secondary | ICD-10-CM | POA: Diagnosis not present

## 2014-08-08 DIAGNOSIS — R1032 Left lower quadrant pain: Secondary | ICD-10-CM

## 2014-08-08 DIAGNOSIS — Z87891 Personal history of nicotine dependence: Secondary | ICD-10-CM

## 2014-08-08 HISTORY — DX: Gastrointestinal hemorrhage, unspecified: K92.2

## 2014-08-08 LAB — CBC WITH DIFFERENTIAL/PLATELET
BASOS ABS: 0 10*3/uL (ref 0.0–0.1)
Basophils Relative: 0 % (ref 0–1)
EOS ABS: 0.3 10*3/uL (ref 0.0–0.7)
EOS PCT: 4 % (ref 0–5)
HCT: 42.2 % (ref 39.0–52.0)
Hemoglobin: 14.2 g/dL (ref 13.0–17.0)
Lymphocytes Relative: 29 % (ref 12–46)
Lymphs Abs: 1.9 10*3/uL (ref 0.7–4.0)
MCH: 28.9 pg (ref 26.0–34.0)
MCHC: 33.6 g/dL (ref 30.0–36.0)
MCV: 85.9 fL (ref 78.0–100.0)
MONO ABS: 0.8 10*3/uL (ref 0.1–1.0)
Monocytes Relative: 12 % (ref 3–12)
NEUTROS PCT: 55 % (ref 43–77)
Neutro Abs: 3.5 10*3/uL (ref 1.7–7.7)
PLATELETS: 176 10*3/uL (ref 150–400)
RBC: 4.91 MIL/uL (ref 4.22–5.81)
RDW: 14.1 % (ref 11.5–15.5)
WBC: 6.6 10*3/uL (ref 4.0–10.5)

## 2014-08-08 LAB — CBC
HEMATOCRIT: 39.9 % (ref 39.0–52.0)
HEMATOCRIT: 39.1 % (ref 39.0–52.0)
HEMOGLOBIN: 13.4 g/dL (ref 13.0–17.0)
Hemoglobin: 12.9 g/dL — ABNORMAL LOW (ref 13.0–17.0)
MCH: 28.9 pg (ref 26.0–34.0)
MCH: 29.1 pg (ref 26.0–34.0)
MCHC: 33.6 g/dL (ref 30.0–36.0)
MCHC: 33 g/dL (ref 30.0–36.0)
MCV: 86 fL (ref 78.0–100.0)
MCV: 88.1 fL (ref 78.0–100.0)
Platelets: 170 10*3/uL (ref 150–400)
Platelets: 143 10*3/uL — ABNORMAL LOW (ref 150–400)
RBC: 4.64 MIL/uL (ref 4.22–5.81)
RBC: 4.44 MIL/uL (ref 4.22–5.81)
RDW: 14.1 % (ref 11.5–15.5)
RDW: 14.2 % (ref 11.5–15.5)
WBC: 6.6 10*3/uL (ref 4.0–10.5)
WBC: 6.5 10*3/uL (ref 4.0–10.5)

## 2014-08-08 LAB — CBG MONITORING, ED
GLUCOSE-CAPILLARY: 168 mg/dL — AB (ref 70–99)
Glucose-Capillary: 238 mg/dL — ABNORMAL HIGH (ref 70–99)

## 2014-08-08 LAB — GLUCOSE, CAPILLARY
Glucose-Capillary: 177 mg/dL — ABNORMAL HIGH (ref 70–99)
Glucose-Capillary: 186 mg/dL — ABNORMAL HIGH (ref 70–99)

## 2014-08-08 LAB — POC OCCULT BLOOD, ED: FECAL OCCULT BLD: POSITIVE — AB

## 2014-08-08 LAB — BASIC METABOLIC PANEL
ANION GAP: 7 (ref 5–15)
BUN: 52 mg/dL — ABNORMAL HIGH (ref 6–23)
CO2: 20 mmol/L (ref 19–32)
Calcium: 8.6 mg/dL (ref 8.4–10.5)
Chloride: 107 mmol/L (ref 96–112)
Creatinine, Ser: 2.75 mg/dL — ABNORMAL HIGH (ref 0.50–1.35)
GFR, EST AFRICAN AMERICAN: 22 mL/min — AB (ref >90)
GFR, EST NON AFRICAN AMERICAN: 19 mL/min — AB (ref >90)
Glucose, Bld: 350 mg/dL — ABNORMAL HIGH (ref 70–99)
Potassium: 5 mmol/L (ref 3.5–5.1)
Sodium: 134 mmol/L — ABNORMAL LOW (ref 135–145)

## 2014-08-08 LAB — MRSA PCR SCREENING: MRSA by PCR: NEGATIVE

## 2014-08-08 LAB — TYPE AND SCREEN
ABO/RH(D): O NEG
Antibody Screen: NEGATIVE

## 2014-08-08 MED ORDER — NITROGLYCERIN 0.4 MG SL SUBL: 0.4000 mg | SUBLINGUAL_TABLET | SUBLINGUAL | Status: DC | PRN

## 2014-08-08 MED ORDER — MORPHINE SULFATE 2 MG/ML IJ SOLN: 1.0000 mg | INTRAMUSCULAR | Status: DC | PRN

## 2014-08-08 MED ORDER — SODIUM CHLORIDE 0.9 % IJ SOLN
3.0000 mL | Freq: Two times a day (BID) | INTRAMUSCULAR | Status: DC
  Administered 2014-08-08 – 2014-08-10 (×3): 3 mL via INTRAVENOUS

## 2014-08-08 MED ORDER — ONDANSETRON HCL 4 MG/2ML IJ SOLN
4.0000 mg | Freq: Four times a day (QID) | INTRAMUSCULAR | Status: DC | PRN
  Administered 2014-08-08: 4 mg via INTRAVENOUS
  Filled 2014-08-08: qty 2

## 2014-08-08 MED ORDER — SODIUM CHLORIDE 0.9 % IV SOLN
INTRAVENOUS | Status: DC
  Administered 2014-08-08: 08:00:00 via INTRAVENOUS

## 2014-08-08 MED ORDER — ONDANSETRON HCL 4 MG PO TABS: 4.0000 mg | ORAL_TABLET | Freq: Four times a day (QID) | ORAL | Status: DC | PRN

## 2014-08-08 MED ORDER — PANTOPRAZOLE SODIUM 40 MG IV SOLR
40.0000 mg | Freq: Two times a day (BID) | INTRAVENOUS | Status: AC
  Administered 2014-08-08 (×2): 40 mg via INTRAVENOUS
  Filled 2014-08-08 (×2): qty 40

## 2014-08-08 MED ORDER — INSULIN ASPART 100 UNIT/ML ~~LOC~~ SOLN
0.0000 [IU] | SUBCUTANEOUS | Status: DC
  Administered 2014-08-08 – 2014-08-09 (×3): 2 [IU] via SUBCUTANEOUS
  Administered 2014-08-09 – 2014-08-10 (×3): 1 [IU] via SUBCUTANEOUS

## 2014-08-08 MED ORDER — CARVEDILOL 12.5 MG PO TABS
12.5000 mg | ORAL_TABLET | Freq: Two times a day (BID) | ORAL | Status: DC
  Administered 2014-08-08 – 2014-08-10 (×4): 12.5 mg via ORAL
  Filled 2014-08-08 (×6): qty 1

## 2014-08-08 MED ORDER — ONDANSETRON HCL 4 MG/2ML IJ SOLN: 4.0000 mg | Freq: Four times a day (QID) | INTRAMUSCULAR | Status: DC | PRN

## 2014-08-08 MED ORDER — PANTOPRAZOLE SODIUM 40 MG IV SOLR
40.0000 mg | INTRAVENOUS | Status: DC
  Administered 2014-08-09: 40 mg via INTRAVENOUS
  Filled 2014-08-08: qty 40

## 2014-08-08 MED ORDER — SODIUM CHLORIDE 0.9 % IV SOLN: INTRAVENOUS | Status: DC

## 2014-08-08 MED ORDER — PANTOPRAZOLE SODIUM 40 MG IV SOLR: 40.0000 mg | INTRAVENOUS | Status: DC

## 2014-08-08 MED ORDER — IOHEXOL 300 MG/ML  SOLN
25.0000 mL | INTRAMUSCULAR | Status: AC
  Administered 2014-08-08 (×2): 25 mL via ORAL

## 2014-08-08 MED ORDER — INSULIN GLARGINE 100 UNIT/ML ~~LOC~~ SOLN
14.0000 [IU] | Freq: Every day | SUBCUTANEOUS | Status: DC
  Administered 2014-08-08 – 2014-08-10 (×2): 14 [IU] via SUBCUTANEOUS
  Filled 2014-08-08 (×3): qty 0.14

## 2014-08-08 NOTE — ED Notes (Addendum)
Pt has had two loose stools, bright red and blood clots present. Smells of GI bleed. Dr. Christy Gentles made aware. Pt c/o mild headache, however no c/o SHOB, chest pain, etc.

## 2014-08-08 NOTE — ED Notes (Signed)
Admitting MD at bedside.

## 2014-08-08 NOTE — Consult Note (Signed)
[facility] Consult: 9:35 AM 08/08/2014  LOS: 0 days    Referring Provider: Dr Hartford Poli.  Primary Care Physician:  Elsie Stain, MD Primary Gastroenterologist:  Dr. Carlean Purl     Reason for Consultation:  Painless hematochezia.    HPI: [name] is a 81 y.o. male.  PMH HTN, CKD stage 4, IDDM, pancreatitis in 2010, fatty liver, ischemic CM with EF 25%, s/p CABG 2010, s/p ICD, thyroid nodule. Partial left nephrectomy for renal cell ca in 2010.  On 2009 colonoscopy had HP polyp, diverticulosis; random biopsies failed to reveal source for diarrhea.  EGD 06/2008 with Schatski's ring and 2cm HH.   Patient awakened at 1:30 this morning needing to have a bowel movement. The stool was deep red blood of a large volume. He had 3 separate episodes of this at home and has had 3 further episodes in the emergency room. The last episode was about an hour and a half ago at 7:30 or 8 AM. He has not been dizzy or lightheaded. He describes intermittent discomfort in his left lower quadrant which started 5-7 days ago.  It is worse since the bleeding started, 4/10 at worst.  [name] is not relieved by passing the blood.  For the past few days patient has had constipation and skipped having bowel movements on at least one day. He may have strained to have a brown stool yesterday but did not see any blood at the time. His appetite is well preserved, no nausea. No weight loss, no dysphagia. No unusual bleeding noted in the urine, from the nose or excessive bleeding from the skin. No large hematomas. Patient takes a full dose aspirin every evening. Sometimes he takes 200 mg ibuprofen instead of the aspirin. This is to control pain in his knees and hands and other joints.  Takes Zantac 75 as needed for heartburn but is not taking this  regularly as he has little in the way of reflux associated symptoms.  In the emergency department patient's hemoglobin was 14.2 initially, 13.4 within 5-1/2 hours. Last recorded hemoglobin had been in 04/2013 when it was 14.8.  PT/INR are WNL. Patient has chronic kidney disease and on 06/15/14 his BUN/creatinine was 33/2.9. In the ED today if these are 52/2.7.    Past Medical History  Diagnosis Date  . HTN (hypertension) (06/17/1981)  . Diverticula of colon (06/18/1983)  . Renal insufficiency   . Hyperlipidemia 06/17/1993)  . Diabetes mellitus  (06/17/1997)  . PUD (peptic ulcer disease) 1960's  . Pancreatitis 01/24 - 07/13/08    ABD CT  fatty liver early pancreatitis  07/12/2008  Pelvic CT Nml 07/12/2008  . Cardiomyopathy     EF 25% Ant Akinesis Global Hypo 3/2-08/18/08  . Thyroid nodule     per Dr. Brantley Stage  . Renal disease     per Dr. Posey Pronto  . Hypogonadism male     Past Surgical History  Procedure Laterality Date  . Colonoscopy    . Cardiac catheterization       Severe 3-vessel coronary artery disease.  Ischemic  cardiomyopathy.   . Nephrectomy  01/09/09    Clear Cell Renal Cell Carcinoma 01/09/2009  . Cardiac defibrillator placement      ICD-Medtronic .  [id number]  . Laparotomy       small mid 80's (laparotomy), 09/12/2000 (no surgery), 10/20/2008 (no surgery)  . Lesion removal      RLE 11/14/2006 (Dr Hulen Skains)   . Ct of abdomen  06/1997    Fatty process unchanged - myelolipoma adrenal unchangd  . Ct of abdomen  02/99    Adrenal mass unchanged, no further follow up required  . Sbo  08/2000 no surgery   10/20/08 no surgery    Mid 80's laparotomy  . Esophagogastroduodenoscopy  07/12/2008    Schatzki Ring 2 cm. HH (Dr. Deatra Ina)  . Abdominal US  07/12/08    Fatty infiltrate liver complex cyst in lower pole left kidney  . Left renal ca  2010    s/p partial nephrectomy per Dr. Alinda Money  . Icd/ crt (lv lead disabled)      Medtronic [id number]  . Coronary artery bypass graft  09/05/08      Mediastinotomy, extracorporeal circulation,   coronary artery bypass graft surgery x4 using a left internal mammary  artery graft to the left anterior descending coronary artery, with a  saphenous vein graft to the first diagonal branch of the LAD, a   saphenous vein graft to the obtuse marginal branch of the left   circumflex coronary artery,  Dr. Cyndia Bent  . Cholecystectomy  1970's    Prior to Admission medications   Medication Sig Start Date End Date Taking? Authorizing Provider  aspirin 325 MG tablet Take 325 mg by mouth daily as needed for mild pain.   Yes Historical Provider, MD  B-D ULTRAFINE III SHORT PEN 31G X 8 MM MISC USE AS DIRECTED 06/14/14  Yes Tonia Ghent, MD  carvedilol (COREG) 12.5 MG tablet TAKE 1 TABLET BY MOUTH TWICE A DAY 06/29/14  Yes Minus Breeding, MD  cholecalciferol (VITAMIN D) 1000 UNITS tablet Take 2,000 Units by mouth daily.   Yes Historical Provider, MD  glucose blood test strip 1 each by Other route 2 (two) times daily as needed for other. Patient taking differently: 1 each by Other route daily.  03/31/14  Yes Tonia Ghent, MD  LANTUS SOLOSTAR 100 UNIT/ML Solostar Pen INJECT 20 UNITS SUB-QUTANEOUSLY DAILY Patient taking differently: INJECT 28 UNITS SUB-QUTANEOUSLY DAILY   Yes Tonia Ghent, MD  lisinopril (PRINIVIL,ZESTRIL) 2.5 MG tablet TAKE 1 TABLET BY MOUTH DAILY 06/20/14  Yes Tonia Ghent, MD  nitroGLYCERIN (NITROSTAT) 0.4 MG SL tablet Place 1 tablet (0.4 mg total) under the tongue every 5 (five) minutes as needed. 08/05/14  Yes Tonia Ghent, MD  Nutritional Supplements (GLUCERNA SHAKE PO) Take 4 oz by mouth daily after breakfast.     Yes Historical Provider, MD  pravastatin (PRAVACHOL) 40 MG tablet TAKE 1 TABLET BY MOUTH AT BEDTIME 04/15/14  Yes Tonia Ghent, MD  ranitidine (ZANTAC) 75 MG tablet Take 75 mg by mouth daily as needed for heartburn.    Yes Historical Provider, MD  testosterone cypionate (DEPOTESTOTERONE CYPIONATE) 200 MG/ML  injection INJECT 1.25ML INTO THE MUSCLE EVERY 28 DAYS 06/30/14  Yes Tonia Ghent, MD  furosemide (LASIX) 20 MG tablet Take 1 tablet (20 mg total) by mouth daily as needed. Patient not taking: Reported on 08/08/2014 06/16/14   Ria Bush, MD    Scheduled Meds:   Infusions: . sodium chloride 30  mL/hr at 08/08/14 0806   PRN Meds: morphine injection, ondansetron (ZOFRAN) IV   Allergies as of 08/08/2014 - Review Complete 08/08/2014  Allergen Reaction Noted  . Celecoxib  09/09/2006  . Nsaids  03/23/2010  . Rosuvastatin      Family History  Problem Relation Age of Onset  . Angina Father   . Hypertension Father   . Heart disease Father     MI, angina  . Breast cancer Mother   . Cancer Mother     Breast  . Heart disease Mother     Valve disease  . Cancer Brother     Brain tumor  . Colon cancer Neg Hx   . Prostate cancer Neg Hx     History   Social History  . Marital Status: Married    Spouse Name: N/A  . Number of Children: 2  . Years of Education: N/A   Occupational History  . Retired from [employer]    Social History Main Topics  . Smoking status: Former Smoker    Quit date: 06/17/1938  . Smokeless tobacco: Never Used  . Alcohol Use: 0.0 oz/week    0 Standard drinks or equivalent per week     Comment: Rare  . Drug Use: No  . Sexual Activity: Not on file   Other Topics Concern  . Not on file   Social History Narrative    The patient is retired.  He has been married since age        of 65.  They have 2 children.  They have grandchildren and great        grandchildren.      REVIEW OF SYSTEMS: Constitutional:  Patient is relatively sedentary as it is hard for him to walk due to knee pain.  No weakness or fatigue. ENT:  No nose bleeds Pulm:  No dyspnea, no cough, no pleuritic pain CV:  No palpitations, no LE edema. No chest pain  GU:  No hematuria, no frequency GI:  Per HPi Heme:  Per HPI   Transfusions:   none Neuro:  No headaches, no peripheral tingling or numbness Derm:  No itching, no rash or sores.  Endocrine:  No sweats or chills.  No polyuria or dysuria.  Says his blood sugars can run up into the 200s at home. Immunization:  Up to date flu,pnvx, shingles vaccines.  Travel:  None beyond local counties in last few months.    PHYSICAL EXAM: Vital signs in last 24 hours: Filed Vitals:   08/08/14 0913  BP: 134/65  Pulse: 62  Temp:   Resp: 20   Wt Readings from Last 3 Encounters:  08/08/14 220 lb (99.791 kg)  06/15/14 220 lb 8 oz (100.018 kg)  05/13/14 223 lb (101.152 kg)   General: Obese, chronically ill appearing, pale WM. He is comfortable and alert. Head:  No swelling, no facial asymmetry.  Eyes:  No scleral icterus, no conjunctival pallor. Ears:  Slightly HOH  Nose:  No congestion or discharge Mouth:  Moist, clear MM. Dentition in good repair. No dental hardware present Neck:  No mass, no TMG, no JVD. Lungs:  Clear bilaterally. No dyspnea, no cough. Heart: RRR. No MRG. S1/S2 audible. Abdomen:  Soft, obese, minimal tenderness in left lower quadrant without guarding or rebound. Active bowel sounds. No distention..   Rectal: Did not repeat. There was deep red blood on digital exam per emergency room provider.   Musc/Skeltl: No joint erythema or  contractures. Arthritic changes in the fingers and knees Extremities:  No pedal edema. Feet are warm with good capillary refill.  Neurologic:  Patient is oriented 3. He is a little bit drowsy but easily aroused and maintains arousal for questioning.  No tremor. Moves all 4 limbs. Skin:  No telangiectasia, no rash, no sores. Tattoos:  None Nodes:  Cervical or inguinal adenopathy.   Psych:  Pleasant, cooperative. Not agitated or depressed.  Intake/Output from previous day:   Intake/Output this shift:    LAB RESULTS:  Recent Labs  08/08/14 0201 08/08/14 0720  WBC 6.6 6.6  HGB 14.2 13.4  HCT 42.2 39.9  PLT 176 170    BMET Lab Results  Component Value Date   NA 134* 08/08/2014   NA 135 06/15/2014   NA 139 10/11/2013   K 5.0 08/08/2014   K 5.6* 06/15/2014   K 4.7 03/22/2011   CL 107 08/08/2014   CL 105 06/15/2014   CL 104 03/13/2011   CO2 20 08/08/2014   CO2 23 06/15/2014   CO2 25 03/13/2011   GLUCOSE 350* 08/08/2014   GLUCOSE 221* 06/15/2014   GLUCOSE 263* 03/13/2011   BUN 52* 08/08/2014   BUN 33* 06/15/2014   BUN 37* 10/11/2013   CREATININE 2.75* 08/08/2014   CREATININE 2.9* 06/15/2014   CREATININE 2.86 04/14/2014   CALCIUM 8.6 08/08/2014   CALCIUM 8.5 06/15/2014   CALCIUM 9.0 03/13/2011   LFT No results for input(s): PROT, ALBUMIN, AST, ALT, ALKPHOS, BILITOT, BILIDIR, IBILI in the last 72 hours. PT/INR Lab Results  Component Value Date   INR 1.03 04/30/2010   INR 1.0 ratio 04/03/2010   INR 1.2 10/21/2008   Hepatitis Panel No results for input(s): HEPBSAG, HCVAB, HEPAIGM, HEPBIGM in the last 72 hours. C-Diff No components found for: CDIFF Lipase     Component Value Date/Time   LIPASE 30.0 11/01/2010 1136    Drugs of Abuse  No results found for: LABOPIA, COCAINSCRNUR, LABBENZ, AMPHETMU, THCU, LABBARB   RADIOLOGY STUDIES: Ct Abdomen Pelvis Wo Contrast  08/08/2014   CLINICAL DATA:  81 year old male with left lower quadrant pain and hematochezia. History of left renal cancer post partial left nephrectomy.  EXAM: CT ABDOMEN AND PELVIS WITHOUT CONTRAST  TECHNIQUE: Multidetector CT imaging of the abdomen and pelvis was performed following the standard protocol without IV contrast.  COMPARISON:  CT 03/07/2014  FINDINGS: Atelectasis in the lingula. Pacemaker wires are partially included. The heart is at the upper limits of normal in size. No consolidation or pleural effusion.  Clips in the gallbladder fossa from cholecystectomy. No focal hepatic lesion. Fatty atrophy of the pancreas without peripancreatic inflammatory change. The spleen is normal.  Sub cm nodular due to the right  adrenal gland. Left adrenal gland is normal. Atrophic left kidney with postsurgical change in the perinephric space, unchanged from prior exam. Partially exophytic 2.2 cm lesion from the lower right kidney is unchanged. The additional adjacent subcentimeter foci are unchanged. There is no right hydronephrosis or stone.  Stomach is normally distended, small hiatal hernia. Duodenum diverticulum is again seen. There is no small bowel dilatation. Multiple colonic diverticula most significant in the distal colon without diverticulitis. Sigmoid colon is tortuous. No colonic wall thickening or inflammatory change. No free air, free fluid, or intra-abdominal fluid collection.  Normal caliber abdominal aorta with atherosclerosis. There is no retroperitoneal adenopathy.  Urinary bladder is physiologically distended. Prostate gland is normal in size. There is no pelvic free fluid. There is fat within both  inguinal canals. No pelvic adenopathy.  There is degenerative change throughout the lumbar spine without acute or suspicious osseous abnormality.  IMPRESSION: 1. Colonic diverticulosis without diverticulitis. 2. Stable appearance of the kidneys with postsurgical change in the left kidney and partially exophytic lesion from the lower right kidney that is not significantly changed, likely hemorrhagic or proteinaceous cyst.   Electronically Signed   By: Jeb Levering M.D.   On: 08/08/2014 05:49    ENDOSCOPIC STUDIES: 06/2008: EGD.  Dr Deatra Ina For: abdominal pain. To D3.  1) Schatzki's ring at the gastroesophageal junction 2) 2 cm hiatal hernia  12/2007  Colonoscopy.  Dr Carlean Purl. For diarrhea, bloating, abdominal pain.   1) 4 MM SIGMOID POLYP REMOVED 2) SIGMOID AND CECAL DIVERTICULOSIS 3) OTHERWISE NORMAL COLONOSCOPY WITH GOOD PREP. RANDOM BIOPSIES TAKEN TO LOOK FOR MICROSCOPIC COLITIS  Path: Sigmoid hyperplastic polyp, random bx: benign colonic mucosa  IMPRESSION:   *  GI bleed.  BUN/creatine ration with  acute elevation of BUN.  Raises question of UGIB.    Schatzki's ring and HH on 06/2008 EGD.  Not on daily PPI at home. Colonic diverticulosis on 2009 colonoscopy and hx seems more c/w LGIB.   *  Ischemic CM.  S/p CABG and ICD.   *  12/2008 partial left Nephrectomy for RCC.   *  CKD. Stage 4.   *  IDDM    PLAN:     *  Given his CKD, angiogram is not an option so do not see need to pursue RBC nuclear scan.  *  I ordered BID IV Protonix for now. EGD set for tomorrow 2/23 at 1400.     Azucena Freed  08/08/2014, 9:35 AM Pager: [phone]      Attending physician's note   I have taken a history, examined the patient and reviewed the chart. I agree with the Advanced Practitioner's note, impression and recommendations. Acute GI bleed. Diverticulosis noted on 2009 colonoscopy. Source of bleed not clear. Possible UGI or LGI. Start evaluation with EGD tomorrow. Monitor Hb/Hct and observe for rebleeding.   Ladene Artist, MD [facility]

## 2014-08-08 NOTE — ED Provider Notes (Signed)
CSN: [id number]     Arrival date & time 08/08/14  0141 History  This chart was scribed for Darrell Cable, MD by Chester Holstein, ED Scribe. This patient was seen in room A02C/A02C and the patient's care was started at 2:17 AM.    Chief Complaint  Patient presents with  . Rectal Bleeding     Patient is a 81 y.o. male presenting with hematochezia. The history is provided by the patient. No language interpreter was used.  Rectal Bleeding Quality:  Bright red Amount:  Copious Duration:  2 hours Progression:  Unchanged Chronicity:  New Context: constipation   Similar prior episodes: no   Relieved by:  None tried Worsened by:  Nothing tried Ineffective treatments:  None tried Associated symptoms: abdominal pain   Associated symptoms: no dizziness and no vomiting    HPI Comments: [name] is a 81 y.o. male with h/o of CABG, nephrectomy, cholecystectomy, left renal CA, HTN, diverticulitis, renal insufficiency, HLD, DM, PUD, pancreatitis, cardiomyopathy, thyroid nodule, hypogonadism who presents to the Emergency Department complaining of rectal bleeding with onset around 12:15 AM. Pt notes no bowel movement just bright red blood. Pt has had associated abdominal pain for 2 days. Pt denies h/o blood transfusion and use of blood thinners. Pt denies melena, nausea, vomiting, dizziness, dysuria, chest pain, and no weakness or numbness in legs different than baseline.   Past Medical History  Diagnosis Date  . HTN (hypertension) (06/17/1981)  . Diverticula of colon (06/18/1983)  . Renal insufficiency   . Hyperlipidemia 06/17/1993)  . Diabetes mellitus  (06/17/1997)  . PUD (peptic ulcer disease) 1960's  . Pancreatitis 01/24 - 07/13/08    ABD CT  fatty liver early pancreatitis  07/12/2008  Pelvic CT Nml 07/12/2008  . Cardiomyopathy     EF 25% Ant Akinesis Global Hypo 3/2-08/18/08  . Thyroid nodule     per Dr. Brantley Stage  . Renal disease     per Dr. Posey Pronto  . Hypogonadism male    Past  Surgical History  Procedure Laterality Date  . Colonoscopy    . Cardiac catheterization       Severe 3-vessel coronary artery disease.  Ischemic   cardiomyopathy.   . Nephrectomy  01/09/09    Clear Cell Renal Cell Carcinoma 01/09/2009  . Cardiac defibrillator placement      ICD-Medtronic .  [id number]  . Laparotomy       small mid 80's (laparotomy), 09/12/2000 (no surgery), 10/20/2008 (no surgery)  . Lesion removal      RLE 11/14/2006 (Dr Hulen Skains)   . Ct of abdomen  06/1997    Fatty process unchanged - myelolipoma adrenal unchangd  . Ct of abdomen  02/99    Adrenal mass unchanged, no further follow up required  . Sbo  08/2000 no surgery   10/20/08 no surgery    Mid 80's laparotomy  . Esophagogastroduodenoscopy  07/12/2008    Schatzki Ring 2 cm. HH (Dr. Deatra Ina)  . Abdominal US  07/12/08    Fatty infiltrate liver complex cyst in lower pole left kidney  . Left renal ca  2010    s/p partial nephrectomy per Dr. Alinda Money  . Icd/ crt (lv lead disabled)      Medtronic [id number]  . Coronary artery bypass graft  09/05/08     Mediastinotomy, extracorporeal circulation,   coronary artery bypass graft surgery x4 using a left internal mammary  artery graft to the left anterior descending coronary artery, with a  saphenous vein graft  to the first diagonal branch of the LAD, a   saphenous vein graft to the obtuse marginal branch of the left   circumflex coronary artery,  Dr. Cyndia Bent  . Cholecystectomy  1970's   Family History  Problem Relation Age of Onset  . Angina Father   . Hypertension Father   . Heart disease Father     MI, angina  . Breast cancer Mother   . Cancer Mother     Breast  . Heart disease Mother     Valve disease  . Cancer Brother     Brain tumor  . Colon cancer Neg Hx   . Prostate cancer Neg Hx    History  Substance Use Topics  . Smoking status: Former Smoker    Quit date: 06/17/1938  . Smokeless tobacco: Never Used  . Alcohol Use: 0.0 oz/week    0 Standard drinks or  equivalent per week     Comment: Rare    Review of Systems  Cardiovascular: Negative for chest pain.  Gastrointestinal: Positive for abdominal pain, constipation, hematochezia and anal bleeding. Negative for nausea and vomiting.  Genitourinary: Negative for dysuria.  Neurological: Negative for dizziness, weakness and numbness.  All other systems reviewed and are negative.     Allergies  Celecoxib; Nsaids; and Rosuvastatin  Home Medications   Prior to Admission medications   Medication Sig Start Date End Date Taking? Authorizing Provider  B-D ULTRAFINE III SHORT PEN 31G X 8 MM MISC USE AS DIRECTED 06/14/14   Tonia Ghent, MD  carvedilol (COREG) 12.5 MG tablet TAKE 1 TABLET BY MOUTH TWICE A DAY 06/29/14   Minus Breeding, MD  cholecalciferol (VITAMIN D) 1000 UNITS tablet Take 2,000 Units by mouth daily.    Historical Provider, MD  furosemide (LASIX) 20 MG tablet Take 1 tablet (20 mg total) by mouth daily as needed. 06/16/14   Ria Bush, MD  glucose blood test strip 1 each by Other route 2 (two) times daily as needed for other. Patient taking differently: 1 each by Other route daily.  03/31/14   Tonia Ghent, MD  LANTUS SOLOSTAR 100 UNIT/ML Solostar Pen INJECT 20 UNITS SUB-QUTANEOUSLY DAILY Patient taking differently: INJECT 28 UNITS SUB-QUTANEOUSLY DAILY    Tonia Ghent, MD  lisinopril (PRINIVIL,ZESTRIL) 2.5 MG tablet TAKE 1 TABLET BY MOUTH DAILY 06/20/14   Tonia Ghent, MD  nitroGLYCERIN (NITROSTAT) 0.4 MG SL tablet Place 1 tablet (0.4 mg total) under the tongue every 5 (five) minutes as needed. 08/05/14   Tonia Ghent, MD  Nutritional Supplements (GLUCERNA SHAKE PO) Take 4 oz by mouth daily after breakfast.      Historical Provider, MD  pravastatin (PRAVACHOL) 40 MG tablet TAKE 1 TABLET BY MOUTH AT BEDTIME 04/15/14   Tonia Ghent, MD  ranitidine (ZANTAC) 75 MG tablet Take 75 mg by mouth as needed.      Historical Provider, MD  testosterone cypionate  (DEPOTESTOTERONE CYPIONATE) 200 MG/ML injection INJECT 1.25ML INTO THE MUSCLE EVERY 28 DAYS 06/30/14   Tonia Ghent, MD   BP 131/73 mmHg  Pulse 62  Temp(Src) 97.4 F (36.3 C) (Oral)  Resp 18  Ht 6\' 1"  (1.854 m)  Wt 220 lb (99.791 kg)  BMI 29.03 kg/m2  SpO2 97% Physical Exam  Nursing note and vitals reviewed.  CONSTITUTIONAL: Well developed/well nourished HEAD: Normocephalic/atraumatic EYES: EOMI/PERRL ENMT: Mucous membranes moist NECK: supple no meningeal signs SPINE/BACK:entire spine nontender CV: S1/S2 noted, no murmurs/rubs/gallops noted LUNGS: Lungs are clear to  auscultation bilaterally, no apparent distress ABDOMEN: soft, mild tenderness to LLQ, no rebound or guarding, bowel sounds noted throughout abdomen GU:no cva tenderness, no inguinal hernia noted RECTAL: bright red blood per rectum no melena , no hemorrhoids NEURO: Pt is awake/alert/appropriate, moves all extremitiesx4.  No facial droop.   EXTREMITIES: pulses normal/equal, full ROM SKIN: warm, color normal PSYCH: no abnormalities of mood noted, alert and oriented to situation  Examination chaperoned  ED Course  Procedures  DIAGNOSTIC STUDIES: Oxygen Saturation is 97% on room air, normal by my interpretation.    COORDINATION OF CARE: 2:25 AM Discussed treatment plan with patient at beside, the patient agrees with the plan and has no further questions at this time.  3:01 AM Pt with continued LLQ pain with BRBPR, CT imaging ordered, without IV contrast due to CRF 6:22 AM CT shows diverticulosis, no evidence of infection Pt had another BM with blood noted here in the ED Will admit monitoring D/w dr stark with [facility] - he is aware of patient Will admit to hospitalist Pt is NOT on anticoagulants 6:31 AM D/w dr gardner Admit to [facility], tele floor BP 117/69 mmHg  Pulse 64  Temp(Src) 97.4 F (36.3 C) (Oral)  Resp 18  Ht 6\' 1"  (1.854 m)  Wt 220 lb (99.791 kg)  BMI 29.03 kg/m2  SpO2 96%  Labs  Review Labs Reviewed  BASIC METABOLIC PANEL - Abnormal; Notable for the following:    Sodium 134 (*)    Glucose, Bld 350 (*)    BUN 52 (*)    Creatinine, Ser 2.75 (*)    GFR calc non Af Amer 19 (*)    GFR calc Af Amer 22 (*)    All other components within normal limits  POC OCCULT BLOOD, ED - Abnormal; Notable for the following:    Fecal Occult Bld POSITIVE (*)    All other components within normal limits  CBC WITH DIFFERENTIAL/PLATELET  TYPE AND SCREEN    Imaging Review Ct Abdomen Pelvis Wo Contrast  08/08/2014   CLINICAL DATA:  81 year old male with left lower quadrant pain and hematochezia. History of left renal cancer post partial left nephrectomy.  EXAM: CT ABDOMEN AND PELVIS WITHOUT CONTRAST  TECHNIQUE: Multidetector CT imaging of the abdomen and pelvis was performed following the standard protocol without IV contrast.  COMPARISON:  CT 03/07/2014  FINDINGS: Atelectasis in the lingula. Pacemaker wires are partially included. The heart is at the upper limits of normal in size. No consolidation or pleural effusion.  Clips in the gallbladder fossa from cholecystectomy. No focal hepatic lesion. Fatty atrophy of the pancreas without peripancreatic inflammatory change. The spleen is normal.  Sub cm nodular due to the right adrenal gland. Left adrenal gland is normal. Atrophic left kidney with postsurgical change in the perinephric space, unchanged from prior exam. Partially exophytic 2.2 cm lesion from the lower right kidney is unchanged. The additional adjacent subcentimeter foci are unchanged. There is no right hydronephrosis or stone.  Stomach is normally distended, small hiatal hernia. Duodenum diverticulum is again seen. There is no small bowel dilatation. Multiple colonic diverticula most significant in the distal colon without diverticulitis. Sigmoid colon is tortuous. No colonic wall thickening or inflammatory change. No free air, free fluid, or intra-abdominal fluid collection.  Normal  caliber abdominal aorta with atherosclerosis. There is no retroperitoneal adenopathy.  Urinary bladder is physiologically distended. Prostate gland is normal in size. There is no pelvic free fluid. There is fat within both inguinal canals. No pelvic  adenopathy.  There is degenerative change throughout the lumbar spine without acute or suspicious osseous abnormality.  IMPRESSION: 1. Colonic diverticulosis without diverticulitis. 2. Stable appearance of the kidneys with postsurgical change in the left kidney and partially exophytic lesion from the lower right kidney that is not significantly changed, likely hemorrhagic or proteinaceous cyst.   Electronically Signed   By: Jeb Levering M.D.   On: 08/08/2014 05:49    MDM   Final diagnoses:  LLQ pain  Diverticulosis of large intestine with hemorrhage  Rectal bleeding   Nursing notes including past medical history and social history reviewed and considered in documentation Labs/vital reviewed myself and considered during evaluation   I personally performed the services described in this documentation, which was scribed in my presence. The recorded information has been reviewed and is accurate.     Darrell Cable, MD 08/08/14 [phone]

## 2014-08-08 NOTE — ED Notes (Signed)
Bright red rectal bleeding since 0030am.  Sl lower abd pain.  Previous divertulitis

## 2014-08-08 NOTE — ED Notes (Signed)
[id number]  

## 2014-08-08 NOTE — ED Notes (Addendum)
MD at bedside discussing changing pt bed assignment to stepdown instead of tele. MD to place order.

## 2014-08-08 NOTE — H&P (Signed)
[facility] History and Physical                                                                                    [name], is a 81 y.o. male  MRN: [id number]   DOB - 1925-07-11  Admit Date - 08/08/2014  Outpatient Primary MD for the patient is Darrell Stain, MD  With History of -  Past Medical History  Diagnosis Date  . HTN (hypertension) (06/17/1981)  . Diverticula of colon (06/18/1983)  . Renal insufficiency   . Hyperlipidemia 06/17/1993)  . Diabetes mellitus  (06/17/1997)  . PUD (peptic ulcer disease) 1960's  . Pancreatitis 01/24 - 07/13/08    ABD CT  fatty liver early pancreatitis  07/12/2008  Pelvic CT Nml 07/12/2008  . Cardiomyopathy     EF 25% Ant Akinesis Global Hypo 3/2-08/18/08  . Thyroid nodule     per Dr. Brantley Houston  . Renal disease     per Dr. Posey Houston  . Hypogonadism male       Past Surgical History  Procedure Laterality Date  . Colonoscopy    . Cardiac catheterization       Severe 3-vessel coronary artery disease.  Ischemic   cardiomyopathy.   . Nephrectomy  01/09/09    Clear Cell Renal Cell Carcinoma 01/09/2009  . Cardiac defibrillator placement      ICD-Medtronic .  [id number]  . Laparotomy       small mid 80's (laparotomy), 09/12/2000 (no surgery), 10/20/2008 (no surgery)  . Lesion removal      RLE 11/14/2006 (Dr [name])   . Ct of abdomen  06/1997    Fatty process unchanged - myelolipoma adrenal unchangd  . Ct of abdomen  02/99    Adrenal mass unchanged, no further follow up required  . Sbo  08/2000 no surgery   10/20/08 no surgery    Mid 80's laparotomy  . Esophagogastroduodenoscopy  07/12/2008    Schatzki Ring 2 cm. HH (Dr. Deatra Houston)  . Abdominal US  07/12/08    Fatty infiltrate liver complex cyst in lower pole left kidney  . Left renal ca  2010    s/p partial nephrectomy per Dr. Alinda Houston  . Icd/ crt (lv lead disabled)      Medtronic [id number]  . Coronary artery bypass graft  09/05/08     Mediastinotomy, extracorporeal circulation,    coronary artery bypass graft surgery x4 using a left internal mammary  artery graft to the left anterior descending coronary artery, with a  saphenous vein graft to the first diagonal branch of the LAD, a   saphenous vein graft to the obtuse marginal branch of the left   circumflex coronary artery,  Dr. Cyndia Houston  . Cholecystectomy  1970's    in for   Chief Complaint  Patient presents with  . Rectal Bleeding     HPI [name]  is a 82 y.o. male, with a history of hypertension, chronic kidney disease Houston IV, dyslipidemia, diabetes mellitus recently poorly controlled, peptic ulcer disease, prior pancreatitis in the setting of fatty liver disease, ischemic cardiomyopathy with an EF of 25% status post ICD, known diverticulosis, and a thyroid nodule being  followed as an outpatient. He also has hypogonadism and is on chronic testosterone replacement therapy.  He presented to the ER today after being awakened around midnight to go the bathroom and discovered he was having bright red blood per rectum. He reports that prior to the onset of the symptoms for about 2-3 days he was having some nonspecific left lower quadrant pain that wasn't very severe. His last normal bowel movement was at 10:30 the night before and was brown in appearance. Since the development of the rectal bleeding he has had 3 stools at home and 2 more bloody stools since arrival to the ER. He is now having suprapubic pain.  In the ER he was hemodynamically stable. Patient states his blood pressure normally runs somewhat low. His hemoglobin was stable at 14.2. His BUN was elevated at 52 and his creatinine was stable at 2.75. GI has been consulted by the EDP. Patient was on aspirin at home.  Review of Systems   In addition to the HPI above,  No Fever-chills, myalgias or other constitutional symptoms No Headache, changes with Vision or hearing, new weakness, tingling, numbness in any extremity, No problems swallowing food or  Liquids, indigestion/reflux No Chest pain, Cough or Shortness of Breath, palpitations, orthopnea or DOE No dysuria, hematuria or flank pain No new skin rashes, lesions, masses or bruises, No new joints pains-aches No recent weight gain or loss No polyuria, polydypsia or polyphagia,  *A full 10 point Review of Systems was done, except as stated above, all other Review of Systems were negative.  Social History History  Substance Use Topics  . Smoking status: Former Smoker    Quit date: 06/17/1938  . Smokeless tobacco: Never Used  . Alcohol Use: 0.0 oz/week    0 Standard drinks or equivalent per week     Comment: Rare    Family History Family History  Problem Relation Age of Onset  . Angina Father   . Hypertension Father   . Heart disease Father     MI, angina  . Breast cancer Mother   . Cancer Mother     Breast  . Heart disease Mother     Valve disease  . Cancer Brother     Brain tumor  . Colon cancer Neg Hx   . Prostate cancer Neg Hx     Prior to Admission medications   Medication Sig Start Date End Date Taking? Authorizing Provider  aspirin 325 MG tablet Take 325 mg by mouth daily as needed for mild pain.   Yes Historical Provider, MD  B-D ULTRAFINE III SHORT PEN 31G X 8 MM MISC USE AS DIRECTED 06/14/14  Yes Darrell Ghent, MD  carvedilol (COREG) 12.5 MG tablet TAKE 1 TABLET BY MOUTH TWICE A DAY 06/29/14  Yes Darrell Breeding, MD  cholecalciferol (VITAMIN D) 1000 UNITS tablet Take 2,000 Units by mouth daily.   Yes Historical Provider, MD  glucose blood test strip 1 each by Other route 2 (two) times daily as needed for other. Patient taking differently: 1 each by Other route daily.  03/31/14  Yes Darrell Ghent, MD  LANTUS SOLOSTAR 100 UNIT/ML Solostar Pen INJECT 20 UNITS SUB-QUTANEOUSLY DAILY Patient taking differently: INJECT 28 UNITS SUB-QUTANEOUSLY DAILY   Yes Darrell Ghent, MD  lisinopril (PRINIVIL,ZESTRIL) 2.5 MG tablet TAKE 1 TABLET BY MOUTH DAILY 06/20/14  Yes  Darrell Ghent, MD  nitroGLYCERIN (NITROSTAT) 0.4 MG SL tablet Place 1 tablet (0.4 mg total) under the tongue every 5 (five)  minutes as needed. 08/05/14  Yes Darrell Ghent, MD  Nutritional Supplements (GLUCERNA SHAKE PO) Take 4 oz by mouth daily after breakfast.     Yes Historical Provider, MD  pravastatin (PRAVACHOL) 40 MG tablet TAKE 1 TABLET BY MOUTH AT BEDTIME 04/15/14  Yes Darrell Ghent, MD  ranitidine (ZANTAC) 75 MG tablet Take 75 mg by mouth daily as needed for heartburn.    Yes Historical Provider, MD  testosterone cypionate (DEPOTESTOTERONE CYPIONATE) 200 MG/ML injection INJECT 1.25ML INTO THE MUSCLE EVERY 28 DAYS 06/30/14  Yes Darrell Ghent, MD  furosemide (LASIX) 20 MG tablet Take 1 tablet (20 mg total) by mouth daily as needed. Patient not taking: Reported on 08/08/2014 06/16/14   Ria Bush, MD    Allergies  Allergen Reactions  . Celecoxib     REACTION: myalgia: abdominal pain  . Nsaids     REACTION: renal disease  . Rosuvastatin     REACTION: myalgia: musle pain    Physical Exam  Vitals  Blood pressure 129/64, pulse 58, temperature 97.4 F (36.3 C), temperature source Oral, resp. rate 22, height 6\' 1"  (1.854 m), weight 220 lb (99.791 kg), SpO2 96 %.   General:  In no acute distress, appears healthy and well nourished  Psych:  Normal affect, Denies Suicidal or Homicidal ideations, Awake Alert, Oriented X 3. Speech and thought patterns are clear and appropriate, no apparent short term memory deficits  Neuro:   No focal neurological deficits, CN II through XII intact, Strength 5/5 all 4 extremities, Sensation intact all 4 extremities.  ENT:  Ears and Eyes appear Normal, Conjunctivae clear, PER. Moist oral mucosa without erythema or exudates.  Neck:  Supple, No lymphadenopathy appreciated  Respiratory:  Symmetrical chest wall movement, Good air movement bilaterally, CTAB. Room Air  Cardiac:  RRR, No Murmurs, chronic stable bilateral LE edema noted merely in  ankles, no JVD, No carotid bruits, peripheral pulses palpable at 2+  Abdomen:  Positive bowel sounds, Soft, mildly tender pre-pubic area, Non distended,  No masses appreciated, no obvious hepatosplenomegaly; per RN has had 2 additional bloody bowel movements since arrival to ER  Skin:  No Cyanosis, Normal Skin Turgor, No Skin Rash or Bruise.  Extremities: Symmetrical without obvious trauma or injury,  no effusions.  Data Review  CBC  Recent Labs Lab 08/08/14 0201 08/08/14 0720  WBC 6.6 6.6  HGB 14.2 13.4  HCT 42.2 39.9  PLT 176 170  MCV 85.9 86.0  MCH 28.9 28.9  MCHC 33.6 33.6  RDW 14.1 14.1  LYMPHSABS 1.9  --   MONOABS 0.8  --   EOSABS 0.3  --   BASOSABS 0.0  --     Chemistries   Recent Labs Lab 08/08/14 0201  NA 134*  K 5.0  CL 107  CO2 20  GLUCOSE 350*  BUN 52*  CREATININE 2.75*  CALCIUM 8.6    estimated creatinine clearance is 24 mL/min (by C-G formula based on Cr of 2.75).  No results for input(s): TSH, T4TOTAL, T3FREE, THYROIDAB in the last 72 hours.  Invalid input(s): FREET3  Coagulation profile No results for input(s): INR, PROTIME in the last 168 hours.  No results for input(s): DDIMER in the last 72 hours.  Cardiac Enzymes No results for input(s): CKMB, TROPONINI, MYOGLOBIN in the last 168 hours.  Invalid input(s): CK  Invalid input(s): POCBNP  Urinalysis    Component Value Date/Time   COLORURINE YELLOW 04/30/2010 [place] 04/30/2010 1740   LABSPEC  1.017 04/30/2010 1740   PHURINE 5.5 04/30/2010 1740   GLUCOSEU NEGATIVE 04/30/2010 1740   HGBUR NEGATIVE 04/30/2010 1740   HGBUR small 12/29/2009 1429   BILIRUBINUR Small 11/01/2010 1105   BILIRUBINUR NEGATIVE 04/30/2010 1740   KETONESUR NEGATIVE 04/30/2010 1740   PROTEINUR 300 11/01/2010 1105   PROTEINUR 30* 04/30/2010 1740   UROBILINOGEN 0.2 11/01/2010 1105   UROBILINOGEN 0.2 04/30/2010 1740   NITRITE Neagtive 11/01/2010 1105   NITRITE NEGATIVE 04/30/2010 1740    LEUKOCYTESUR Negative 11/01/2010 1105    Imaging results:   Ct Abdomen Pelvis Wo Contrast  08/08/2014   CLINICAL DATA:  81 year old male with left lower quadrant pain and hematochezia. History of left renal cancer post partial left nephrectomy.  EXAM: CT ABDOMEN AND PELVIS WITHOUT CONTRAST  TECHNIQUE: Multidetector CT imaging of the abdomen and pelvis was performed following the standard protocol without IV contrast.  COMPARISON:  CT 03/07/2014  FINDINGS: Atelectasis in the lingula. Pacemaker wires are partially included. The heart is at the upper limits of normal in size. No consolidation or pleural effusion.  Clips in the gallbladder fossa from cholecystectomy. No focal hepatic lesion. Fatty atrophy of the pancreas without peripancreatic inflammatory change. The spleen is normal.  Sub cm nodular due to the right adrenal gland. Left adrenal gland is normal. Atrophic left kidney with postsurgical change in the perinephric space, unchanged from prior exam. Partially exophytic 2.2 cm lesion from the lower right kidney is unchanged. The additional adjacent subcentimeter foci are unchanged. There is no right hydronephrosis or stone.  Stomach is normally distended, small hiatal hernia. Duodenum diverticulum is again seen. There is no small bowel dilatation. Multiple colonic diverticula most significant in the distal colon without diverticulitis. Sigmoid colon is tortuous. No colonic wall thickening or inflammatory change. No free air, free fluid, or intra-abdominal fluid collection.  Normal caliber abdominal aorta with atherosclerosis. There is no retroperitoneal adenopathy.  Urinary bladder is physiologically distended. Prostate gland is normal in size. There is no pelvic free fluid. There is fat within both inguinal canals. No pelvic adenopathy.  There is degenerative change throughout the lumbar spine without acute or suspicious osseous abnormality.  IMPRESSION: 1. Colonic diverticulosis without diverticulitis.  2. Stable appearance of the kidneys with postsurgical change in the left kidney and partially exophytic lesion from the lower right kidney that is not significantly changed, likely hemorrhagic or proteinaceous cyst.   Electronically Signed   By: Jeb Levering M.D.   On: 08/08/2014 05:49     Assessment & Plan  Principal Problem:   Lower GI bleed -Admit to stepdown unit since active bright red blood per rectum -Given patient's vascular history suspect ischemic colitis although did have initial left lower quadrant abdominal pain and could be diverticular bleed although diverticular bleeds typically tend to be painless -Waiting for formal GI consultation -Currently not anemic but we'll cycle CBC every 12 hours with threshold for transfusion for hemoglobin less than or equal to 8.0 -Hold aspirin products -Nothing by mouth for now with gentle IV fluid hydration -Does have history of peptic ulcer disease as well as Helicobacter positive status in the past; on Zantac at home but will provide IV Protonix here  Active Problems:   Chronic systolic congestive heart failure, NYHA class 3/ ICD -Currently stable and compensated -Gentle IV fluid hydration to avoid volume overload    CKD , Houston IV -Creatinine is stable and at baseline of range of 2.75 to 2.9 -Elevated BUN likely from GI bleeding  DM , type 2, uncontrolled, with renal complications -Last hemoglobin A1c was 10.1 in December 2015 -CBGs elevated greater than 200 and was actually 350 on BMET at presentation -CBG checks with sliding scale insulin every 4 hours while nothing by mouth -Since nothing by mouth give half of usual home dose of insulin -Anion gap was 7 but this in setting of chronic kidney disease    HTN  -Normally has softer blood pressure readings with systolics typically around 110 and 120 according to patient -Monitor closely in setting of ongoing GI bleeding -We'll continue low-dose carvedilol for now but hold  lisinopril    CAD  -Currently asymptomatic and denies shortness of breath or chest pain currently or prior to admission    Malignant neoplasm of kidney-s/p partial resection; complex cyst remaining tissue    Testosterone deficiency -Holding home testosterone injection    Dyslipidemia -Nothing by mouth so holding home medications    Obese    DVT Prophylaxis: SCDs  Family Communication:  Wife and other family members at bedside   Code Status:  Full  Condition:  Guarded  Time spent in minutes : 60   ELLIS,ALLISON L. ANP on 08/08/2014 at 9:07 AM  Between 7am to 7pm - Pager - [phone]  After 7pm go to [email or web address] - password [email or web address]  And look for the night coverage person covering me after hours  [facility]

## 2014-08-09 ENCOUNTER — Encounter (HOSPITAL_COMMUNITY): Payer: Self-pay | Admitting: *Deleted

## 2014-08-09 ENCOUNTER — Encounter (HOSPITAL_COMMUNITY)
Admission: EM | Disposition: A | Discharge status: Home / Self Care | Payer: Self-pay | Source: Home / Self Care | Attending: Internal Medicine

## 2014-08-09 DIAGNOSIS — I10 Essential (primary) hypertension: Secondary | ICD-10-CM

## 2014-08-09 DIAGNOSIS — D62 Acute posthemorrhagic anemia: Secondary | ICD-10-CM

## 2014-08-09 HISTORY — PX: ESOPHAGOGASTRODUODENOSCOPY: SHX5428

## 2014-08-09 LAB — GLUCOSE, CAPILLARY
GLUCOSE-CAPILLARY: 105 mg/dL — AB (ref 70–99)
GLUCOSE-CAPILLARY: 100 mg/dL — AB (ref 70–99)
GLUCOSE-CAPILLARY: 133 mg/dL — AB (ref 70–99)
GLUCOSE-CAPILLARY: 167 mg/dL — AB (ref 70–99)
Glucose-Capillary: 99 mg/dL (ref 70–99)
Glucose-Capillary: 102 mg/dL — ABNORMAL HIGH (ref 70–99)

## 2014-08-09 LAB — CBC
HCT: 38.2 % — ABNORMAL LOW (ref 39.0–52.0)
HEMATOCRIT: 38.3 % — AB (ref 39.0–52.0)
Hemoglobin: 12.6 g/dL — ABNORMAL LOW (ref 13.0–17.0)
Hemoglobin: 12.7 g/dL — ABNORMAL LOW (ref 13.0–17.0)
MCH: 28.4 pg (ref 26.0–34.0)
MCH: 28.7 pg (ref 26.0–34.0)
MCHC: 33 g/dL (ref 30.0–36.0)
MCHC: 33.2 g/dL (ref 30.0–36.0)
MCV: 86.2 fL (ref 78.0–100.0)
MCV: 86.5 fL (ref 78.0–100.0)
PLATELETS: 170 10*3/uL (ref 150–400)
Platelets: 175 10*3/uL (ref 150–400)
RBC: 4.43 MIL/uL (ref 4.22–5.81)
RBC: 4.43 MIL/uL (ref 4.22–5.81)
RDW: 14.1 % (ref 11.5–15.5)
RDW: 14.3 % (ref 11.5–15.5)
WBC: 7 10*3/uL (ref 4.0–10.5)
WBC: 7 10*3/uL (ref 4.0–10.5)

## 2014-08-09 LAB — COMPREHENSIVE METABOLIC PANEL
ALBUMIN: 3.3 g/dL — AB (ref 3.5–5.2)
ALT: 20 U/L (ref 0–53)
ANION GAP: 6 (ref 5–15)
AST: 23 U/L (ref 0–37)
Alkaline Phosphatase: 72 U/L (ref 39–117)
BUN: 50 mg/dL — ABNORMAL HIGH (ref 6–23)
CALCIUM: 8.4 mg/dL (ref 8.4–10.5)
CO2: 22 mmol/L (ref 19–32)
Chloride: 110 mmol/L (ref 96–112)
Creatinine, Ser: 2.65 mg/dL — ABNORMAL HIGH (ref 0.50–1.35)
GFR calc Af Amer: 24 mL/min — ABNORMAL LOW (ref >90)
GFR calc non Af Amer: 20 mL/min — ABNORMAL LOW (ref >90)
Glucose, Bld: 111 mg/dL — ABNORMAL HIGH (ref 70–99)
POTASSIUM: 4.7 mmol/L (ref 3.5–5.1)
Sodium: 138 mmol/L (ref 135–145)
TOTAL PROTEIN: 5.7 g/dL — AB (ref 6.0–8.3)
Total Bilirubin: 0.7 mg/dL (ref 0.3–1.2)

## 2014-08-09 LAB — PROTIME-INR
INR: 1.14 (ref 0.00–1.49)
Prothrombin Time: 14.7 seconds (ref 11.6–15.2)

## 2014-08-09 LAB — APTT: aPTT: 29 seconds (ref 24–37)

## 2014-08-09 SURGERY — EGD (ESOPHAGOGASTRODUODENOSCOPY)
Anesthesia: Moderate Sedation

## 2014-08-09 MED ORDER — PEG-KCL-NACL-NASULF-NA ASC-C 100 G PO SOLR
0.5000 | Freq: Once | ORAL | Status: AC
  Administered 2014-08-10: 100 g via ORAL
  Filled 2014-08-09: qty 1

## 2014-08-09 MED ORDER — DIPHENHYDRAMINE HCL 50 MG/ML IJ SOLN
INTRAMUSCULAR | Status: AC
  Filled 2014-08-09: qty 1

## 2014-08-09 MED ORDER — METOCLOPRAMIDE HCL 5 MG/ML IJ SOLN
10.0000 mg | Freq: Once | INTRAMUSCULAR | Status: AC
  Administered 2014-08-09: 10 mg via INTRAVENOUS
  Filled 2014-08-09: qty 2

## 2014-08-09 MED ORDER — FENTANYL CITRATE 0.05 MG/ML IJ SOLN
INTRAMUSCULAR | Status: DC | PRN
  Administered 2014-08-09: 25 ug via INTRAVENOUS

## 2014-08-09 MED ORDER — SODIUM CHLORIDE 0.9 % IV SOLN
INTRAVENOUS | Status: DC
  Administered 2014-08-09: 500 mL via INTRAVENOUS

## 2014-08-09 MED ORDER — MIDAZOLAM HCL 5 MG/ML IJ SOLN
INTRAMUSCULAR | Status: AC
  Filled 2014-08-09: qty 2

## 2014-08-09 MED ORDER — PEG-KCL-NACL-NASULF-NA ASC-C 100 G PO SOLR
0.5000 | Freq: Once | ORAL | Status: AC
  Administered 2014-08-09: 100 g via ORAL
  Filled 2014-08-09: qty 1

## 2014-08-09 MED ORDER — SODIUM CHLORIDE 0.9 % IV SOLN
INTRAVENOUS | Status: DC
  Administered 2014-08-09: 18:00:00 via INTRAVENOUS

## 2014-08-09 MED ORDER — PANTOPRAZOLE SODIUM 40 MG PO TBEC
40.0000 mg | DELAYED_RELEASE_TABLET | Freq: Every day | ORAL | Status: DC
  Administered 2014-08-10: 40 mg via ORAL
  Filled 2014-08-09: qty 1

## 2014-08-09 MED ORDER — FENTANYL CITRATE 0.05 MG/ML IJ SOLN
INTRAMUSCULAR | Status: AC
  Filled 2014-08-09: qty 2

## 2014-08-09 MED ORDER — PEG-KCL-NACL-NASULF-NA ASC-C 100 G PO SOLR: 1.0000 | Freq: Once | ORAL | Status: DC

## 2014-08-09 MED ORDER — BUTAMBEN-TETRACAINE-BENZOCAINE 2-2-14 % EX AERO
INHALATION_SPRAY | CUTANEOUS | Status: DC | PRN
  Administered 2014-08-09: 2 via TOPICAL

## 2014-08-09 MED ORDER — MIDAZOLAM HCL 10 MG/2ML IJ SOLN
INTRAMUSCULAR | Status: DC | PRN
  Administered 2014-08-09: 2 mg via INTRAVENOUS

## 2014-08-09 MED ORDER — METOCLOPRAMIDE HCL 5 MG/ML IJ SOLN
10.0000 mg | Freq: Once | INTRAMUSCULAR | Status: AC
  Administered 2014-08-10: 10 mg via INTRAVENOUS
  Filled 2014-08-09: qty 2

## 2014-08-09 NOTE — Op Note (Addendum)
[facility] [place], [id number]   ENDOSCOPY PROCEDURE REPORT  PATIENT: [name], [name]  MR#: [id number] BIRTHDATE: February 01, 1926 , 88  yrs. old GENDER: male ENDOSCOPIST: Ladene Artist, MD, [facility] REFERRED BY:  [facility] PROCEDURE DATE:  08/09/2014 PROCEDURE:  EGD, diagnostic ASA CLASS:     Class III INDICATIONS:  hematochezia.  LLQ pain MEDICATIONS: Fentanyl 25 mcg IV and Versed 2 mg IV TOPICAL ANESTHETIC: Cetacaine Spray DESCRIPTION OF PROCEDURE: After the risks benefits and alternatives of the procedure were thoroughly explained, informed consent was obtained.  The Pentax Gastroscope [id number] endoscope was introduced through the mouth and advanced to the second portion of the duodenum , Without limitations.  The instrument was slowly withdrawn as the mucosa was fully examined.    ESOPHAGUS: There was a short benign appearing stricture at the gastroesophageal junction.  The stricture was easily traversable. The esophagus was otherwise normal. STOMACH: Mild nonerosive gastritis was found in the prepyloric region of the stomach.   The stomach otherwise appeared normal. DUODENUM: There was a benign appearing stricture in the 2nd part of the duodenum.  The stricture was easily traversable.  Mild duodenal inflammation, nonerosive, was found in the duodenal bulb. Retroflexed views revealed a small hiatal hernia.  The scope was then withdrawn from the patient and the procedure completed.  COMPLICATIONS: There were no immediate complications.  ENDOSCOPIC IMPRESSION: 1.   Short stricture at the gastroesophageal junction 2.   Gastritis, nonerosive, in the prepyloric region of the stomach 3.   Small hiatal hernia 4.   Stricture in the 2nd part of the duodenum 5.   Duodenitis, nonerosive, in the duodenal bulb  RECOMMENDATIONS: 1.  Anti-reflux regimen 2.  Continue PPI 3.  Proceed with a Colonoscopy tomorrow  eSigned:  Ladene Artist, MD, [facility] 08/09/2014 2:10 PM Revised: 08/09/2014 2:10 PM

## 2014-08-09 NOTE — Progress Notes (Signed)
[facility] PROGRESS NOTE  [name] VFI:[id number] DOB: Aug 18, 1925 DOA: 08/08/2014 PCP: Elsie Stain, MD  Assessment/Plan: 1. Suspected lower GI bleed. -Patient with a history of diverticulosis presenting with bright red blood per rectum, can diverticular bleed a possibility -GI consulted, recommending Protonix 40 mg IV twice a day along with further workup with EGD that is planned for today at 2:00. -Patient having last episode of bright red blood per rectum overnight. He remains hemodynamically stable with downward trend in hemoglobin 14 to 12.6. -Continue supportive care, monitor H&H.  2. Chronic systolic congestive heart failure -Has history of ischemic cardiomyopathy, as thoracic echocardiogram performed on 11/09/2009 showed an ejection fraction of 2525% with diffuse hypokinesis -Patient appears clinically compensated -We'll continue carvedilol 12.5 mg by mouth twice a day  3.  Insulin-dependent diabetes mellitus. -Blood sugars are well controlled -Will continue Lantus 14 units subcutaneous daily along with sliding scale coverage -Early nothing by mouth for procedure  4.  Coronary artery disease -Patient with an established history of coronary artery disease and ischemic cardiomyopathy, antiplatelet therapy was discontinued due to presence of GI bleed. -Denies chest pain or shortness of breath.  5.  Hypertension. -There was concern for precipitating hypotension for which his lisinopril was discontinued -Remains on carvedilol 12.5 g by mouth twice a day, last blood pressure 112/64  Code Status: Full code Family Communication: Spoke with patient's family members present at bedside Disposition Plan: LAD for EGD today, follow H&H   Consultants:  GI  Procedures:  EGD   HPI/Subjective: Patient is a pleasant 81 year old gentleman with multiple comorbidities including ischemic cardiomyopathy having an ejection fraction of 25%, status post ICD placement,  history of diverticulosis who presented to the emergency room on 08/08/2014 with complaints of bright red blood per rectum. He reported having multiple episodes of bloody stools prior to hospitalization. Initial labs revealed a hemoglobin of 14.2, trending down overnight to 12.6. He denies chest pain, shortness of breath, dizziness or lightheadedness. GI was consulted. Plan for endoscopy today.  Objective: Filed Vitals:   08/09/14 0830  BP: 112/64  Pulse: 67  Temp: 97.4 F (36.3 C)  Resp: 16    Intake/Output Summary (Last 24 hours) at 08/09/14 0849 Last data filed at 08/09/14 0300  Gross per 24 hour  Intake    687 ml  Output    300 ml  Net    387 ml   Filed Weights   08/08/14 0150 08/09/14 0500  Weight: 99.791 kg (220 lb) 100 kg (220 lb 7.4 oz)    Exam:   General:  Patient is in no acute distress, he is awake and alert, oriented 3   Cardiovascular: Reglan rate and rhythm normal S1-S2   Respiratory: normal respiratory effort lungs are clear   Abdomen: He has left lower quadrant abdominal pain to palpation, otherwise abdomen is soft, positive bowel sounds, nondistended  Musculoskeletal:  no edema  Data Reviewed: Basic Metabolic Panel:  Recent Labs Lab 08/08/14 0201 08/09/14 0324  NA 134* 138  K 5.0 4.7  CL 107 110  CO2 20 22  GLUCOSE 350* 111*  BUN 52* 50*  CREATININE 2.75* 2.65*  CALCIUM 8.6 8.4   Liver Function Tests:  Recent Labs Lab 08/09/14 0324  AST 23  ALT 20  ALKPHOS 72  BILITOT 0.7  PROT 5.7*  ALBUMIN 3.3*   No results for input(s): LIPASE, AMYLASE in the last 168 hours. No results for input(s): AMMONIA in the last 168 hours. CBC:  Recent Labs Lab  08/08/14 0201 08/08/14 0720 08/08/14 1850 08/09/14 0324  WBC 6.6 6.6 6.5 7.0  NEUTROABS 3.5  --   --   --   HGB 14.2 13.4 12.9* 12.6*  HCT 42.2 39.9 39.1 38.2*  MCV 85.9 86.0 88.1 86.2  PLT 176 170 143* 175   Cardiac Enzymes: No results for input(s): CKTOTAL, CKMB, CKMBINDEX,  TROPONINI in the last 168 hours. BNP (last 3 results) No results for input(s): BNP in the last 8760 hours.  ProBNP (last 3 results) No results for input(s): PROBNP in the last 8760 hours.  CBG:  Recent Labs Lab 08/08/14 1713 08/08/14 2059 08/09/14 0108 08/09/14 0400 08/09/14 0834  GLUCAP 177* 186* 105* 99 102*    Recent Results (from the past 240 hour(s))  MRSA PCR Screening     Status: None   Collection Time: 08/08/14  5:07 PM  Result Value Ref Range Status   MRSA by PCR NEGATIVE NEGATIVE Final    Comment:        The GeneXpert MRSA Assay (FDA approved for NASAL specimens only), is one component of a comprehensive MRSA colonization surveillance program. It is not intended to diagnose MRSA infection nor to guide or monitor treatment for MRSA infections.      Studies: Ct Abdomen Pelvis Wo Contrast  08/08/2014   CLINICAL DATA:  81 year old male with left lower quadrant pain and hematochezia. History of left renal cancer post partial left nephrectomy.  EXAM: CT ABDOMEN AND PELVIS WITHOUT CONTRAST  TECHNIQUE: Multidetector CT imaging of the abdomen and pelvis was performed following the standard protocol without IV contrast.  COMPARISON:  CT 03/07/2014  FINDINGS: Atelectasis in the lingula. Pacemaker wires are partially included. The heart is at the upper limits of normal in size. No consolidation or pleural effusion.  Clips in the gallbladder fossa from cholecystectomy. No focal hepatic lesion. Fatty atrophy of the pancreas without peripancreatic inflammatory change. The spleen is normal.  Sub cm nodular due to the right adrenal gland. Left adrenal gland is normal. Atrophic left kidney with postsurgical change in the perinephric space, unchanged from prior exam. Partially exophytic 2.2 cm lesion from the lower right kidney is unchanged. The additional adjacent subcentimeter foci are unchanged. There is no right hydronephrosis or stone.  Stomach is normally distended, small hiatal  hernia. Duodenum diverticulum is again seen. There is no small bowel dilatation. Multiple colonic diverticula most significant in the distal colon without diverticulitis. Sigmoid colon is tortuous. No colonic wall thickening or inflammatory change. No free air, free fluid, or intra-abdominal fluid collection.  Normal caliber abdominal aorta with atherosclerosis. There is no retroperitoneal adenopathy.  Urinary bladder is physiologically distended. Prostate gland is normal in size. There is no pelvic free fluid. There is fat within both inguinal canals. No pelvic adenopathy.  There is degenerative change throughout the lumbar spine without acute or suspicious osseous abnormality.  IMPRESSION: 1. Colonic diverticulosis without diverticulitis. 2. Stable appearance of the kidneys with postsurgical change in the left kidney and partially exophytic lesion from the lower right kidney that is not significantly changed, likely hemorrhagic or proteinaceous cyst.   Electronically Signed   By: Jeb Levering M.D.   On: 08/08/2014 05:49    Scheduled Meds: . carvedilol  12.5 mg Oral BID WC  . insulin aspart  0-9 Units Subcutaneous 6 times per day  . insulin glargine  14 Units Subcutaneous Daily  . pantoprazole (PROTONIX) IV  40 mg Intravenous Q24H  . sodium chloride  3 mL Intravenous Q12H  Continuous Infusions: . sodium chloride    . sodium chloride 30 mL/hr at 08/08/14 0806  . sodium chloride      Principal Problem:   Lower GI bleed Active Problems:   Malignant neoplasm of kidney-s/p partial resection; complex cyst remaining tissue   DM (diabetes mellitus), type 2, uncontrolled, with renal complications   Testosterone deficiency   Dyslipidemia   Obese   HTN (hypertension)   Chronic systolic congestive heart failure, NYHA class 3   CKD (chronic kidney disease), stage IV   CAD (coronary artery disease)   ICD (implantable cardioverter-defibrillator) in place    Time spent: 35 min    Kelvin Cellar  [facility] Pager [phone]. If 7PM-7AM, please contact night-coverage at [email or web address], password [facility] 08/09/2014, 8:49 AM  LOS: 1 day

## 2014-08-09 NOTE — Progress Notes (Signed)
Pt made aware that EGD is planned for 1400, consent was signed by patient and new Iv started. Pt accidentally pulled out iv.

## 2014-08-09 NOTE — Progress Notes (Signed)
Daily Rounding Note  08/09/2014, 8:33 AM  LOS: 1 day   SUBJECTIVE:       Darker stool last night, none since.  No dizziness with standing.    OBJECTIVE:         Vital signs in last 24 hours:    Temp:  [97.4 F (36.3 C)-97.6 F (36.4 C)] 97.4 F (36.3 C) (02/23 0830) Pulse Rate:  [55-70] 67 (02/23 0830) Resp:  [12-26] 16 (02/23 0830) BP: (102-144)/(55-87) 112/64 mmHg (02/23 0830) SpO2:  [95 %-100 %] 96 % (02/23 0830) Weight:  [220 lb 7.4 oz (100 kg)] 220 lb 7.4 oz (100 kg) (02/23 0500)   Filed Weights   08/08/14 0150 08/09/14 0500  Weight: 220 lb (99.791 kg) 220 lb 7.4 oz (100 kg)   General: pleasant, somewhat pale   Heart: RRR Chest: clear bil Abdomen: soft, obese, active BS. Slightly tender in left mid and lower abdomen.   Extremities: no CCE Neuro/Psych:  Oriented x 3.  No tremor, no gross deficits.   Intake/Output from previous day: 02/22 0701 - 02/23 0700 In: 687 [P.O.:120; I.V.:567] Out: 300 [Urine:300]  Intake/Output this shift:    Lab Results:  Recent Labs  08/08/14 0720 08/08/14 1850 08/09/14 0324  WBC 6.6 6.5 7.0  HGB 13.4 12.9* 12.6*  HCT 39.9 39.1 38.2*  PLT 170 143* 175   BMET  Recent Labs  08/08/14 0201 08/09/14 0324  NA 134* 138  K 5.0 4.7  CL 107 110  CO2 20 22  GLUCOSE 350* 111*  BUN 52* 50*  CREATININE 2.75* 2.65*  CALCIUM 8.6 8.4   LFT  Recent Labs  08/09/14 0324  PROT 5.7*  ALBUMIN 3.3*  AST 23  ALT 20  ALKPHOS 72  BILITOT 0.7   PT/INR  Recent Labs  08/09/14 0324  LABPROT 14.7  INR 1.14    Studies/Results: Ct Abdomen Pelvis Wo Contrast  08/08/2014   CLINICAL DATA:  81 year old male with left lower quadrant pain and hematochezia. History of left renal cancer post partial left nephrectomy.  EXAM: CT ABDOMEN AND PELVIS WITHOUT CONTRAST  TECHNIQUE: Multidetector CT imaging of the abdomen and pelvis was performed following the standard protocol without  IV contrast.  COMPARISON:  CT 03/07/2014  FINDINGS: Atelectasis in the lingula. Pacemaker wires are partially included. The heart is at the upper limits of normal in size. No consolidation or pleural effusion.  Clips in the gallbladder fossa from cholecystectomy. No focal hepatic lesion. Fatty atrophy of the pancreas without peripancreatic inflammatory change. The spleen is normal.  Sub cm nodular due to the right adrenal gland. Left adrenal gland is normal. Atrophic left kidney with postsurgical change in the perinephric space, unchanged from prior exam. Partially exophytic 2.2 cm lesion from the lower right kidney is unchanged. The additional adjacent subcentimeter foci are unchanged. There is no right hydronephrosis or stone.  Stomach is normally distended, small hiatal hernia. Duodenum diverticulum is again seen. There is no small bowel dilatation. Multiple colonic diverticula most significant in the distal colon without diverticulitis. Sigmoid colon is tortuous. No colonic wall thickening or inflammatory change. No free air, free fluid, or intra-abdominal fluid collection.  Normal caliber abdominal aorta with atherosclerosis. There is no retroperitoneal adenopathy.  Urinary bladder is physiologically distended. Prostate gland is normal in size. There is no pelvic free fluid. There is fat within both inguinal canals. No pelvic adenopathy.  There is degenerative change throughout the lumbar spine without acute  or suspicious osseous abnormality.  IMPRESSION: 1. Colonic diverticulosis without diverticulitis. 2. Stable appearance of the kidneys with postsurgical change in the left kidney and partially exophytic lesion from the lower right kidney that is not significantly changed, likely hemorrhagic or proteinaceous cyst.   Electronically Signed   By: Jeb Levering M.D.   On: 08/08/2014 05:49   Scheduled Meds: . carvedilol  12.5 mg Oral BID WC  . insulin aspart  0-9 Units Subcutaneous 6 times per day  .  insulin glargine  14 Units Subcutaneous Daily  . pantoprazole (PROTONIX) IV  40 mg Intravenous Q24H  . sodium chloride  3 mL Intravenous Q12H   Continuous Infusions: . sodium chloride    . sodium chloride 30 mL/hr at 08/08/14 0806  . sodium chloride     PRN Meds:.morphine injection, morphine injection, nitroGLYCERIN, ondansetron **OR** ondansetron (ZOFRAN) IV   ASSESMENT:    * GI bleed. BUN/creatine ration with acute elevation of BUN. Raises question of UGIB.  Schatzki's ring and HH on 06/2008 EGD. Not on daily PPI at home.  Now on Protonix 40 mg IV q day.  Colonic diverticulosis on 2009 colonoscopy and Ct of 2/22 and current hx seems more c/w LGIB.  Hgb drop of ~1.5 grams overall.  No need of transfusion.   * Ischemic CM. S/p CABG and ICD.   * 12/2008 partial left Nephrectomy for RCC.   * CKD. Stage 4.   * IDDM.     PLAN   *  EGD today at 2 PM.     Azucena Freed  08/09/2014, 8:33 AM Pager: [phone]    Attending physician's note   I have taken an interval history, reviewed the chart and examined the patient. I agree with the Advanced Practitioner's note, impression and recommendations.   Pricilla Riffle. Fuller Plan, MD [facility]

## 2014-08-10 ENCOUNTER — Inpatient Hospital Stay (HOSPITAL_COMMUNITY): Payer: Medicare Other | Admitting: Anesthesiology

## 2014-08-10 ENCOUNTER — Encounter (HOSPITAL_COMMUNITY)
Admission: EM | Disposition: A | Discharge status: Home / Self Care | Payer: Self-pay | Source: Home / Self Care | Attending: Internal Medicine

## 2014-08-10 ENCOUNTER — Encounter (HOSPITAL_COMMUNITY): Payer: Self-pay | Admitting: Gastroenterology

## 2014-08-10 HISTORY — PX: COLONOSCOPY WITH PROPOFOL: SHX5780

## 2014-08-10 LAB — BASIC METABOLIC PANEL
ANION GAP: 12 (ref 5–15)
BUN: 46 mg/dL — ABNORMAL HIGH (ref 6–23)
CO2: 15 mmol/L — ABNORMAL LOW (ref 19–32)
CREATININE: 2.62 mg/dL — AB (ref 0.50–1.35)
Calcium: 8.3 mg/dL — ABNORMAL LOW (ref 8.4–10.5)
Chloride: 110 mmol/L (ref 96–112)
GFR calc non Af Amer: 21 mL/min — ABNORMAL LOW (ref >90)
GFR, EST AFRICAN AMERICAN: 24 mL/min — AB (ref >90)
Glucose, Bld: 115 mg/dL — ABNORMAL HIGH (ref 70–99)
POTASSIUM: 4.6 mmol/L (ref 3.5–5.1)
SODIUM: 137 mmol/L (ref 135–145)

## 2014-08-10 LAB — CBC
HEMATOCRIT: 35.8 % — AB (ref 39.0–52.0)
Hemoglobin: 11.8 g/dL — ABNORMAL LOW (ref 13.0–17.0)
MCH: 28.2 pg (ref 26.0–34.0)
MCHC: 33 g/dL (ref 30.0–36.0)
MCV: 85.6 fL (ref 78.0–100.0)
Platelets: 159 10*3/uL (ref 150–400)
RBC: 4.18 MIL/uL — ABNORMAL LOW (ref 4.22–5.81)
RDW: 14.1 % (ref 11.5–15.5)
WBC: 6.2 10*3/uL (ref 4.0–10.5)

## 2014-08-10 LAB — GLUCOSE, CAPILLARY
GLUCOSE-CAPILLARY: 104 mg/dL — AB (ref 70–99)
GLUCOSE-CAPILLARY: 163 mg/dL — AB (ref 70–99)
Glucose-Capillary: 108 mg/dL — ABNORMAL HIGH (ref 70–99)
Glucose-Capillary: 132 mg/dL — ABNORMAL HIGH (ref 70–99)
Glucose-Capillary: 137 mg/dL — ABNORMAL HIGH (ref 70–99)

## 2014-08-10 SURGERY — COLONOSCOPY
Anesthesia: Moderate Sedation

## 2014-08-10 SURGERY — COLONOSCOPY WITH PROPOFOL
Anesthesia: Monitor Anesthesia Care

## 2014-08-10 MED ORDER — LACTATED RINGERS IV SOLN
INTRAVENOUS | Status: DC
  Administered 2014-08-10: 12:00:00 via INTRAVENOUS

## 2014-08-10 MED ORDER — ASPIRIN 325 MG PO TABS: 325.0000 mg | ORAL_TABLET | Freq: Every day | ORAL | Status: DC | PRN

## 2014-08-10 MED ORDER — DEXTROSE 5 % IV SOLN
INTRAVENOUS | Status: DC
Start: 2014-08-10 — End: 2014-08-10

## 2014-08-10 MED ORDER — LACTATED RINGERS IV SOLN
INTRAVENOUS | Status: DC | PRN
  Administered 2014-08-10: 11:00:00 via INTRAVENOUS

## 2014-08-10 MED ORDER — LIDOCAINE HCL (CARDIAC) 20 MG/ML IV SOLN
INTRAVENOUS | Status: DC | PRN
  Administered 2014-08-10: 60 mg via INTRAVENOUS

## 2014-08-10 MED ORDER — PROPOFOL INFUSION 10 MG/ML OPTIME
INTRAVENOUS | Status: DC | PRN
  Administered 2014-08-10: 100 ug/kg/min via INTRAVENOUS

## 2014-08-10 MED ORDER — PANTOPRAZOLE SODIUM 40 MG PO TBEC: 40.0000 mg | DELAYED_RELEASE_TABLET | Freq: Every day | ORAL | Status: DC

## 2014-08-10 MED ORDER — PHENYLEPHRINE HCL 10 MG/ML IJ SOLN
INTRAMUSCULAR | Status: DC | PRN
  Administered 2014-08-10: 80 ug via INTRAVENOUS

## 2014-08-10 NOTE — Discharge Summary (Signed)
[name], is a 81 y.o. male  DOB 1926-03-08  MRN [id number].  Admission date:  08/08/2014  Admitting Physician  Verlee Monte, MD  Discharge Date:  08/10/2014   Primary MD  Elsie Stain, MD  Recommendations for primary care physician for things to follow:   Follow CBC, BMP. Knees to follow with GI closely   Admission Diagnosis  Rectal bleeding [K62.5] LLQ pain [R10.32] Hyperglycemia [R73.9] Chronic renal failure, unspecified stage [N18.9] Diverticulosis of large intestine with hemorrhage [K57.31]   Discharge Diagnosis  Rectal bleeding [K62.5] LLQ pain [R10.32] Hyperglycemia [R73.9] Chronic renal failure, unspecified stage [N18.9] Diverticulosis of large intestine with hemorrhage [K57.31]    Principal Problem:   Lower GI bleed Active Problems:   Malignant neoplasm of kidney-s/p partial resection; complex cyst remaining tissue   DM (diabetes mellitus), type 2, uncontrolled, with renal complications   Testosterone deficiency   Dyslipidemia   Obese   HTN (hypertension)   Chronic systolic congestive heart failure, NYHA class 3   CKD (chronic kidney disease), stage IV   CAD (coronary artery disease)   ICD (implantable cardioverter-defibrillator) in place   Anemia due to blood loss, acute      Past Medical History  Diagnosis Date  . HTN (hypertension) (06/17/1981)  . Diverticula of colon (06/18/1983)  . Renal insufficiency   . Hyperlipidemia 06/17/1993)  . Diabetes mellitus  (06/17/1997)  . PUD (peptic ulcer disease) 1960's  . Pancreatitis 01/24 - 07/13/08    ABD CT  fatty liver early pancreatitis  07/12/2008  Pelvic CT Nml 07/12/2008  . Cardiomyopathy     EF 25% Ant Akinesis Global Hypo 3/2-08/18/08  . Thyroid nodule     per Dr. Brantley Stage  . Renal disease     per Dr. Posey Pronto  . Hypogonadism  male     Past Surgical History  Procedure Laterality Date  . Colonoscopy  12/2007    4 mm sigmoid HP polyp, diverticulosis. random biopsies negative for colitis.   . Cardiac catheterization       Severe 3-vessel coronary artery disease.  Ischemic   cardiomyopathy.   . Nephrectomy  01/09/09    Partial left nephrectomy. Clear Cell Renal Cell Carcinoma 01/09/2009  . Cardiac defibrillator placement      ICD-Medtronic .  [id number]  . Laparotomy       small mid 80's (laparotomy), 09/12/2000 (no surgery), 10/20/2008 (no surgery)  . Lesion removal      RLE 11/14/2006 (Dr Hulen Skains)   . Ct of abdomen  06/1997    Fatty process unchanged - myelolipoma adrenal unchangd  . Ct of abdomen  02/99    Adrenal mass unchanged, no further follow up required  . Sbo  08/2000 no surgery   10/20/08 no surgery    Mid 80's laparotomy  . Esophagogastroduodenoscopy  07/12/2008    Schatzki Ring 2 cm. HH (Dr. Deatra Ina)  . Abdominal US  07/12/08    Fatty infiltrate liver complex cyst in lower pole left kidney  . Icd/ crt (lv lead disabled)  Medtronic [id number]  . Coronary artery bypass graft  09/05/08     Mediastinotomy, extracorporeal circulation,   coronary artery bypass graft surgery x4 using a left internal mammary  artery graft to the left anterior descending coronary artery, with a  saphenous vein graft to the first diagonal branch of the LAD, a   saphenous vein graft to the obtuse marginal branch of the left   circumflex coronary artery,  Dr. Cyndia Bent  . Cholecystectomy  1970's  . Esophagogastroduodenoscopy N/A 08/09/2014    Procedure: ESOPHAGOGASTRODUODENOSCOPY (EGD);  Surgeon: Ladene Artist, MD;  Location: [facility] ENDOSCOPY;  Service: Endoscopy;  Laterality: N/A;       History of present illness and  Hospital Course:     Kindly see H&P for history of present illness and admission details, please review complete Labs, Consult reports and Test reports for all details in brief  HPI  from the history and physical  done on the day of admission  Patient is a pleasant 81 year old gentleman with multiple comorbidities including ischemic cardiomyopathy having an ejection fraction of 25%, status post ICD placement, history of diverticulosis who presented to the emergency room on 08/08/2014 with complaints of bright red blood per rectum. He reported having multiple episodes of bloody stools prior to hospitalization. Initial labs revealed a hemoglobin of 14.2, trending down overnight to 12.6. He denies chest pain, shortness of breath, dizziness or lightheadedness. GI was consulted.    Hospital Course    1. Suspected lower GI bleed. -Patient with a history of diverticulosis presenting with bright red blood per rectum, GI on board, EGD showing mild gastritis,  No further bleeding. Hemoglobin trend stable when accounted for hemodilution, no need for transfusion yet. Underwent colonoscopy today which showed internal hemorrhoids and diverticulosis but no active bleeding. Will be switched to PPI from Zantac. Will follow with PCP and GI, aspirin to be held for 2 weeks. Bleeding most likely lower however upper GI bleed due to mild gastritis cannot be fully ruled out.   2. Chronic systolic congestive heart failure -Has history of ischemic cardiomyopathy, as thoracic echocardiogram performed on 11/09/2009 showed an ejection fraction of 25% with diffuse hypokinesis, clinically compensated this admission continue Coreg and home dose ACE/ARB unchanged.     3. Insulin-dependent diabetes mellitus. -Blood sugars are well controlled, resume his home regimen unchanged   4. Coronary artery disease -Patient with an established history of coronary artery disease and ischemic cardiomyopathy, antiplatelet therapy was discontinued due to presence of GI bleed. Have asked him to continue to hold his aspirin for 2 weeks, resume once okay by GI physician Dr. Carlean Purl on follow-up visit. -Denies chest pain or shortness of breath.   5.  Hypertension. -Resume home regimen.         Discharge Condition: Stable   Follow UP  Follow-up Information    Follow up with Elsie Stain, MD. Schedule an appointment as soon as possible for a visit in 1 week.   Specialty:  Family Medicine   Contact information:   [place] [address] [phone]       Follow up with Silvano Rusk, MD. Schedule an appointment as soon as possible for a visit in 1 week.   Specialty:  Gastroenterology   Contact information:   [address] [place] [address] [phone]         Discharge Instructions  and  Discharge Medications      Discharge Instructions    Discharge instructions  Complete by:  As directed   Follow with Primary MD Elsie Stain, MD in 7 days   Get CBC, CMP, 2 view Chest X ray checked  by Primary MD next visit.    Activity: As tolerated with Full fall precautions use walker/cane & assistance as needed   Disposition Home    Diet: Heart Healthy Low Carb.  For Heart failure patients - Check your Weight same time everyday, if you gain over 2 pounds, or you develop in leg swelling, experience more shortness of breath or chest pain, call your Primary MD immediately. Follow Cardiac Low Salt Diet and 1.5 lit/day fluid restriction.   On your next visit with your primary care physician please Get Medicines reviewed and adjusted.   Please request your Prim.MD to go over all Hospital Tests and Procedure/Radiological results at the follow up, please get all Hospital records sent to your Prim MD by signing hospital release before you go home.   If you experience worsening of your admission symptoms, develop shortness of breath, life threatening emergency, suicidal or homicidal thoughts you must seek medical attention immediately by calling 911 or calling your MD immediately  if symptoms less severe.  You Must read complete instructions/literature along with all the possible adverse  reactions/side effects for all the Medicines you take and that have been prescribed to you. Take any new Medicines after you have completely understood and accpet all the possible adverse reactions/side effects.   Do not drive, operating heavy machinery, perform activities at heights, swimming or participation in water activities or provide baby sitting services if your were admitted for syncope or siezures until you have seen by Primary MD or a Neurologist and advised to do so again.  Do not drive when taking Pain medications.    Do not take more than prescribed Pain, Sleep and Anxiety Medications  Special Instructions: If you have smoked or chewed Tobacco  in the last 2 yrs please stop smoking, stop any regular Alcohol  and or any Recreational drug use.  Wear Seat belts while driving.   Please note  You were cared for by a hospitalist during your hospital stay. If you have any questions about your discharge medications or the care you received while you were in the hospital after you are discharged, you can call the unit and asked to speak with the hospitalist on call if the hospitalist that took care of you is not available. Once you are discharged, your primary care physician will handle any further medical issues. Please note that NO REFILLS for any discharge medications will be authorized once you are discharged, as it is imperative that you return to your primary care physician (or establish a relationship with a primary care physician if you do not have one) for your aftercare needs so that they can reassess your need for medications and monitor your lab values.     Increase activity slowly    Complete by:  As directed             Medication List    STOP taking these medications        ranitidine 75 MG tablet  Commonly known as:  ZANTAC      TAKE these medications        aspirin 325 MG tablet  Take 1 tablet (325 mg total) by mouth daily as needed for mild pain.  Start  taking on:  08/24/2014     B-D ULTRAFINE III SHORT PEN 31G X 8 MM  Misc  Generic drug:  Insulin Pen Needle  USE AS DIRECTED     carvedilol 12.5 MG tablet  Commonly known as:  COREG  TAKE 1 TABLET BY MOUTH TWICE A DAY     cholecalciferol 1000 UNITS tablet  Commonly known as:  VITAMIN D  Take 2,000 Units by mouth daily.     furosemide 20 MG tablet  Commonly known as:  LASIX  Take 1 tablet (20 mg total) by mouth daily as needed.     GLUCERNA SHAKE PO  Take 4 oz by mouth daily after breakfast.     glucose blood test strip  1 each by Other route 2 (two) times daily as needed for other.     LANTUS SOLOSTAR 100 UNIT/ML Solostar Pen  Generic drug:  Insulin Glargine  INJECT 20 UNITS SUB-QUTANEOUSLY DAILY     lisinopril 2.5 MG tablet  Commonly known as:  PRINIVIL,ZESTRIL  TAKE 1 TABLET BY MOUTH DAILY     nitroGLYCERIN 0.4 MG SL tablet  Commonly known as:  NITROSTAT  Place 1 tablet (0.4 mg total) under the tongue every 5 (five) minutes as needed.     pantoprazole 40 MG tablet  Commonly known as:  PROTONIX  Take 1 tablet (40 mg total) by mouth daily.     pravastatin 40 MG tablet  Commonly known as:  PRAVACHOL  TAKE 1 TABLET BY MOUTH AT BEDTIME     testosterone cypionate 200 MG/ML injection  Commonly known as:  DEPOTESTOTERONE CYPIONATE  INJECT 1.25ML INTO THE MUSCLE EVERY 28 DAYS          Diet and Activity recommendation: See Discharge Instructions above   Consults obtained - GI   Major procedures and Radiology Reports - PLEASE review detailed and final reports for all details, in brief -    2. 08/09/14 EGD: gastritis, small HH, duodenitis. Strictures at [place] and D2. However none of these are sources for acute bleed. 3. 08/11/14 - colonoscopy -show some internal hemorrhoids and diverticulosis    Ct Abdomen Pelvis Wo Contrast  08/08/2014   CLINICAL DATA:  81 year old male with left lower quadrant pain and hematochezia. History of left renal cancer post partial  left nephrectomy.  EXAM: CT ABDOMEN AND PELVIS WITHOUT CONTRAST  TECHNIQUE: Multidetector CT imaging of the abdomen and pelvis was performed following the standard protocol without IV contrast.  COMPARISON:  CT 03/07/2014  FINDINGS: Atelectasis in the lingula. Pacemaker wires are partially included. The heart is at the upper limits of normal in size. No consolidation or pleural effusion.  Clips in the gallbladder fossa from cholecystectomy. No focal hepatic lesion. Fatty atrophy of the pancreas without peripancreatic inflammatory change. The spleen is normal.  Sub cm nodular due to the right adrenal gland. Left adrenal gland is normal. Atrophic left kidney with postsurgical change in the perinephric space, unchanged from prior exam. Partially exophytic 2.2 cm lesion from the lower right kidney is unchanged. The additional adjacent subcentimeter foci are unchanged. There is no right hydronephrosis or stone.  Stomach is normally distended, small hiatal hernia. Duodenum diverticulum is again seen. There is no small bowel dilatation. Multiple colonic diverticula most significant in the distal colon without diverticulitis. Sigmoid colon is tortuous. No colonic wall thickening or inflammatory change. No free air, free fluid, or intra-abdominal fluid collection.  Normal caliber abdominal aorta with atherosclerosis. There is no retroperitoneal adenopathy.  Urinary bladder is physiologically distended. Prostate gland is normal in size. There is no pelvic free fluid. There is fat  within both inguinal canals. No pelvic adenopathy.  There is degenerative change throughout the lumbar spine without acute or suspicious osseous abnormality.  IMPRESSION: 1. Colonic diverticulosis without diverticulitis. 2. Stable appearance of the kidneys with postsurgical change in the left kidney and partially exophytic lesion from the lower right kidney that is not significantly changed, likely hemorrhagic or proteinaceous cyst.   Electronically  Signed   By: Jeb Levering M.D.   On: 08/08/2014 05:49    Micro Results     Recent Results (from the past 240 hour(s))  MRSA PCR Screening     Status: None   Collection Time: 08/08/14  5:07 PM  Result Value Ref Range Status   MRSA by PCR NEGATIVE NEGATIVE Final    Comment:        The GeneXpert MRSA Assay (FDA approved for NASAL specimens only), is one component of a comprehensive MRSA colonization surveillance program. It is not intended to diagnose MRSA infection nor to guide or monitor treatment for MRSA infections.        Today   Subjective:   [name] today has no headache,no chest abdominal pain,no new weakness tingling or numbness, feels much better wants to go home today.   Objective:   Blood pressure 148/80, pulse 72, temperature 98.1 F (36.7 C), temperature source Oral, resp. rate 18, height 6\' 1"  (1.854 m), weight 99.3 kg (218 lb 14.7 oz), SpO2 99 %.   Intake/Output Summary (Last 24 hours) at 08/10/14 1500 Last data filed at 08/10/14 1228  Gross per 24 hour  Intake   5320 ml  Output    580 ml  Net   4740 ml    Exam Awake Alert, Oriented x 3, No new F.N deficits, Normal affect [place].AT,PERRAL Supple Neck,No JVD, No cervical lymphadenopathy appriciated.  Symmetrical Chest wall movement, Good air movement bilaterally, CTAB RRR,No Gallops,Rubs or new Murmurs, No Parasternal Heave +ve B.Sounds, Abd Soft, Non tender, No organomegaly appriciated, No rebound -guarding or rigidity. No Cyanosis, Clubbing or edema, No new Rash or bruise  Data Review   CBC w Diff: Lab Results  Component Value Date   WBC 6.2 08/10/2014   HGB 11.8* 08/10/2014   HCT 35.8* 08/10/2014   PLT 159 08/10/2014   LYMPHOPCT 29 08/08/2014   MONOPCT 12 08/08/2014   EOSPCT 4 08/08/2014   BASOPCT 0 08/08/2014    CMP: Lab Results  Component Value Date   NA 137 08/10/2014   NA 139 10/11/2013   K 4.6 08/10/2014   CL 110 08/10/2014   CO2 15* 08/10/2014   BUN 46*  08/10/2014   BUN 37* 10/11/2013   CREATININE 2.62* 08/10/2014   PROT 5.7* 08/09/2014   ALBUMIN 3.3* 08/09/2014   BILITOT 0.7 08/09/2014   ALKPHOS 72 08/09/2014   AST 23 08/09/2014   ALT 20 08/09/2014  .   Total Time in preparing paper work, data evaluation and todays exam - 35 minutes  Thurnell Lose M.D on 08/10/2014 at 3:00 PM  [facility] Office  [phone]

## 2014-08-10 NOTE — Op Note (Signed)
[facility] [place], [address]   COLONOSCOPY PROCEDURE REPORT  PATIENT: [name], [name]  MR#: [id number] BIRTHDATE: 1925-07-09 , 88  yrs. old GENDER: male ENDOSCOPIST: Ladene Artist, MD, [facility] REFERRED EF:[facility] PROCEDURE DATE:  08/10/2014 PROCEDURE:   Colonoscopy, diagnostic First Screening Colonoscopy - Avg.  risk and is 50 yrs.  old or older - No.  Prior Negative Screening - Now for repeat screening. N/A  History of Adenoma - Now for follow-up colonoscopy & has been > or = to 3 yrs.  N/A  Polyps Removed Today? No.  Polyps Removed Today? No.  Recommend repeat exam, <10 yrs? Polyps Removed Today? No.  Recommend repeat exam, <10 yrs? No. ASA CLASS:   Class III INDICATIONS:hematochezia. MEDICATIONS: Monitored anesthesia care and Per Anesthesia DESCRIPTION OF PROCEDURE:   After the risks benefits and alternatives of the procedure were thoroughly explained, informed consent was obtained.  The digital rectal exam revealed no abnormalities of the rectum.   The Pentax Ped Colon [id number] endoscope was introduced through the anus and advanced to the cecum, which was identified by both the appendix and ileocecal valve. No adverse events experienced.   The quality of the prep was good, using MoviPrep  The instrument was then slowly withdrawn as the colon was fully examined.    COLON FINDINGS: There was moderate diverticulosis noted in the sigmoid colon and descending colon with associated luminal narrowing and muscular hypertrophy. No blood in the colon. The examination was otherwise normal.  Retroflexed views revealed internal Grade I hemorrhoids. The time to cecum=3 minutes 00 seconds.  Withdrawal time=9 minutes 00 seconds.  The scope was withdrawn and the procedure completed. COMPLICATIONS: There were no immediate complications.  ENDOSCOPIC IMPRESSION: 1.   Moderate diverticulosis in the sigmoid colon and  descending colon 2.   [place] I internal hemorrhoids  RECOMMENDATIONS: 1.  Low fiber diet for 2 weeks and then high fiber diet with liberal fluid intake long term 2.  Hold Aspirin and all other NSAIDS for 2 weeks. 3.  GI follow up with Dr. Carlean Purl as needed 4.  Advance diet and OK for discharge later today from GI standpoint  eSigned:  Ladene Artist, MD, [facility] 08/10/2014 12:36 PM

## 2014-08-10 NOTE — Progress Notes (Signed)
          Daily Rounding Note  08/10/2014, 8:15 AM  LOS: 2 days   SUBJECTIVE:       Blood noted at start of bowel prep.  Now resolved.  Stools clear after completing stage 2 of split dose prep.  Not dizzy, no pain, no nausea.   OBJECTIVE:         Vital signs in last 24 hours:    Temp:  [97 F (36.1 C)-97.5 F (36.4 C)] 97.5 F (36.4 C) (02/24 0350) Pulse Rate:  [54-131] 62 (02/24 0350) Resp:  [9-22] 19 (02/24 0350) BP: (102-166)/(42-77) 124/77 mmHg (02/24 0350) SpO2:  [94 %-99 %] 99 % (02/24 0350) Weight:  [218 lb 14.7 oz (99.3 kg)] 218 lb 14.7 oz (99.3 kg) (02/24 0354) Last BM Date: 08/08/14 Filed Weights   08/08/14 0150 08/09/14 0500 08/10/14 0354  Weight: 220 lb (99.791 kg) 220 lb 7.4 oz (100 kg) 218 lb 14.7 oz (99.3 kg)   General: a bit drowsy.  comfortable   Heart: RRR Chest: clear bil.  No dyspnea or cough Abdomen: soft, NT, ND.  BS hypoactive  Extremities: no CCE Neuro/Psych:  Pleasant, oriented x 3.  No limb weakness or tremor.   Intake/Output from previous day: 02/23 0701 - 02/24 0700 In: [place] [P.O.:4780; I.V.:720] Out: 630 [Urine:630]  Intake/Output this shift:    Lab Results:  Recent Labs  08/09/14 0324 08/09/14 1550 08/10/14 0323  WBC 7.0 7.0 6.2  HGB 12.6* 12.7* 11.8*  HCT 38.2* 38.3* 35.8*  PLT 175 170 159   BMET  Recent Labs  08/08/14 0201 08/09/14 0324 08/10/14 0323  NA 134* 138 137  K 5.0 4.7 4.6  CL 107 110 110  CO2 20 22 15*  GLUCOSE 350* 111* 115*  BUN 52* 50* 46*  CREATININE 2.75* 2.65* 2.62*  CALCIUM 8.6 8.4 8.3*   LFT  Recent Labs  08/09/14 0324  PROT 5.7*  ALBUMIN 3.3*  AST 23  ALT 20  ALKPHOS 72  BILITOT 0.7   PT/INR  Recent Labs  08/09/14 0324  LABPROT 14.7  INR 1.14   Scheduled Meds: . carvedilol  12.5 mg Oral BID WC  . insulin aspart  0-9 Units Subcutaneous 6 times per day  . insulin glargine  14 Units Subcutaneous Daily  . pantoprazole  40 mg Oral  Q0600  . sodium chloride  3 mL Intravenous Q12H   Continuous Infusions: . sodium chloride 20 mL/hr at 08/09/14 1730   PRN Meds:.morphine injection, morphine injection, nitroGLYCERIN, ondansetron **OR** ondansetron (ZOFRAN) IV   ASSESMENT:   *  GIB with hematochezia.   08/09/14 EGD: gastritis, small HH, duodenitis.  Strictures at [place] and D2. However none of these are sources for acute bleed.    *  ABL anemia.  Not in need of transfusion.   *   CKD, stage 4.   PLAN   *  Needs to stay on daily PPI (none PTA) *  Colonoscopy at noon today.  *  CBC in AM.     [name]  08/10/2014, 8:15 AM Pager: [phone]     Attending physician's note   I have taken an interval history, reviewed the chart and examined the patient. I agree with the Advanced Practitioner's note, impression and recommendations. Hematochezia with LGI source suspected. Bleeding during prep vs clearing old blood. Hb has dropped to 11.8. Trend CBC. Colonoscopy today to further evaluate.  Pricilla Riffle. Fuller Plan, MD [facility]

## 2014-08-10 NOTE — Anesthesia Postprocedure Evaluation (Signed)
  Anesthesia Post-op Note  Patient: [name]  Procedure(s) Performed: Procedure(s): COLONOSCOPY WITH PROPOFOL (N/A)  Patient Location: Endoscopy Unit  Anesthesia Type:MAC  Level of Consciousness: awake, alert  and oriented  Airway and Oxygen Therapy: Patient Spontanous Breathing and Patient connected to nasal cannula oxygen  Post-op Pain: none  Post-op Assessment: Post-op Vital signs reviewed, Patient's Cardiovascular Status Stable, Respiratory Function Stable, Patent Airway, No signs of Nausea or vomiting and Pain level controlled  Post-op Vital Signs: Reviewed and stable  Last Vitals:  Filed Vitals:   08/10/14 1235  BP: 127/59  Pulse: 71  Temp:   Resp:     Complications: No apparent anesthesia complications

## 2014-08-10 NOTE — Progress Notes (Signed)
[facility] PROGRESS NOTE  Darrell MCCLENATHAN YCX:[id number] DOB: 1926/01/05 DOA: 08/08/2014 PCP: Elsie Stain, MD    Summary  Patient is a pleasant 81 year old gentleman with multiple comorbidities including ischemic cardiomyopathy having an ejection fraction of 25%, status post ICD placement, history of diverticulosis who presented to the emergency room on 08/08/2014 with complaints of bright red blood per rectum. He reported having multiple episodes of bloody stools prior to hospitalization. Initial labs revealed a hemoglobin of 14.2, trending down overnight to 12.6. He denies chest pain, shortness of breath, dizziness or lightheadedness. GI was consulted. Plan for endoscopy today.   Subjective  Patient in bed, no headache, no chest-abd pain, no further blood per rectum   Assessment/Plan:  1. Suspected lower GI bleed. -Patient with a history of diverticulosis presenting with bright red blood per rectum, GI on board, EGD showing mild gastritis, on IV PPI, anoscopy do today. No further bleeding. Hemoglobin trend stable when accounted for hemodilution, no need for transfusion yet. Continue monitoring H&H.    2. Chronic systolic congestive heart failure -Has history of ischemic cardiomyopathy, as thoracic echocardiogram performed on 11/09/2009 showed an ejection fraction of 25% with diffuse hypokinesis -Patient appears clinically compensated -We'll continue carvedilol 12.5 mg by mouth twice a day   3.  Insulin-dependent diabetes mellitus. -Blood sugars are well controlled -Will continue Lantus 14 units subcutaneous daily along with sliding scale coverage -Early nothing by mouth for procedure  CBG (last 3)   Recent Labs  08/10/14 0028 08/10/14 0349 08/10/14 0817  GLUCAP 132* 108* 104*      4.  Coronary artery disease -Patient with an established history of coronary artery disease and ischemic cardiomyopathy, antiplatelet therapy was discontinued due to presence of GI  bleed. -Denies chest pain or shortness of breath.   5.  Hypertension. -There was concern for precipitating hypotension for which his lisinopril was discontinued -Remains on carvedilol 12.5 g by mouth twice a day, last blood pressure 112/64    Code Status: Full code Family Communication: wife Disposition Plan: Home    Consultants:  GI  Procedures:   08/09/14 EGD: gastritis, small HH, duodenitis. Strictures at [place] and D2. However none of these are sources for acute bleed.  08/11/14 - colonoscopy -      Objective: Filed Vitals:   08/10/14 0822  BP: 143/60  Pulse: 59  Temp: 97.2 F (36.2 C)  Resp: 58    Intake/Output Summary (Last 24 hours) at 08/10/14 1116 Last data filed at 08/10/14 0700  Gross per 24 hour  Intake   5380 ml  Output    630 ml  Net   4750 ml   Filed Weights   08/08/14 0150 08/09/14 0500 08/10/14 0354  Weight: 99.791 kg (220 lb) 100 kg (220 lb 7.4 oz) 99.3 kg (218 lb 14.7 oz)    Exam:   General:  Patient is in no acute distress, he is awake and alert, oriented 3   Cardiovascular: Reglan rate and rhythm normal S1-S2   Respiratory: normal respiratory effort lungs are clear   Abdomen: He has left lower quadrant abdominal pain to palpation, otherwise abdomen is soft, positive bowel sounds, nondistended  Musculoskeletal:  no edema  Data Reviewed: Basic Metabolic Panel:  Recent Labs Lab 08/08/14 0201 08/09/14 0324 08/10/14 0323  NA 134* 138 137  K 5.0 4.7 4.6  CL 107 110 110  CO2 20 22 15*  GLUCOSE 350* 111* 115*  BUN 52* 50* 46*  CREATININE 2.75* 2.65* 2.62*  CALCIUM 8.6 8.4 8.3*   Liver Function Tests:  Recent Labs Lab 08/09/14 0324  AST 23  ALT 20  ALKPHOS 72  BILITOT 0.7  PROT 5.7*  ALBUMIN 3.3*   No results for input(s): LIPASE, AMYLASE in the last 168 hours. No results for input(s): AMMONIA in the last 168 hours. CBC:  Recent Labs Lab 08/08/14 0201 08/08/14 0720 08/08/14 1850 08/09/14 0324  08/09/14 1550 08/10/14 0323  WBC 6.6 6.6 6.5 7.0 7.0 6.2  NEUTROABS 3.5  --   --   --   --   --   HGB 14.2 13.4 12.9* 12.6* 12.7* 11.8*  HCT 42.2 39.9 39.1 38.2* 38.3* 35.8*  MCV 85.9 86.0 88.1 86.2 86.5 85.6  PLT 176 170 143* 175 170 159   Cardiac Enzymes: No results for input(s): CKTOTAL, CKMB, CKMBINDEX, TROPONINI in the last 168 hours. BNP (last 3 results) No results for input(s): BNP in the last 8760 hours.  ProBNP (last 3 results) No results for input(s): PROBNP in the last 8760 hours.  CBG:  Recent Labs Lab 08/09/14 1559 08/09/14 2125 08/10/14 0028 08/10/14 0349 08/10/14 0817  GLUCAP 133* 167* 132* 108* 104*    Recent Results (from the past 240 hour(s))  MRSA PCR Screening     Status: None   Collection Time: 08/08/14  5:07 PM  Result Value Ref Range Status   MRSA by PCR NEGATIVE NEGATIVE Final    Comment:        The GeneXpert MRSA Assay (FDA approved for NASAL specimens only), is one component of a comprehensive MRSA colonization surveillance program. It is not intended to diagnose MRSA infection nor to guide or monitor treatment for MRSA infections.      Studies: No results found.  Scheduled Meds: . carvedilol  12.5 mg Oral BID WC  . insulin aspart  0-9 Units Subcutaneous 6 times per day  . insulin glargine  14 Units Subcutaneous Daily  . pantoprazole  40 mg Oral Q0600  . sodium chloride  3 mL Intravenous Q12H   Continuous Infusions: . sodium chloride 20 mL/hr at 08/09/14 1730    Principal Problem:   Lower GI bleed Active Problems:   Malignant neoplasm of kidney-s/p partial resection; complex cyst remaining tissue   DM (diabetes mellitus), type 2, uncontrolled, with renal complications   Testosterone deficiency   Dyslipidemia   Obese   HTN (hypertension)   Chronic systolic congestive heart failure, NYHA class 3   CKD (chronic kidney disease), stage IV   CAD (coronary artery disease)   ICD (implantable cardioverter-defibrillator) in  place   Anemia due to blood loss, acute    Time spent: 35 min    [facility] Pager [phone]. If 7PM-7AM, please contact night-coverage at [email or web address], password [facility] 08/10/2014, 11:16 AM  LOS: 2 days

## 2014-08-10 NOTE — Transfer of Care (Signed)
Immediate Anesthesia Transfer of Care Note  Patient: [name]  Procedure(s) Performed: Procedure(s): COLONOSCOPY WITH PROPOFOL (N/A)  Patient Location: Endoscopy Unit  Anesthesia Type:MAC  Level of Consciousness: awake, alert  and oriented  Airway & Oxygen Therapy: Patient Spontanous Breathing and Patient connected to nasal cannula oxygen  Post-op Assessment: Report given to RN, Post -op Vital signs reviewed and stable and Patient moving all extremities X 4  Post vital signs: Reviewed and stable  Last Vitals:  Filed Vitals:   08/10/14 1235  BP: 127/59  Pulse: 71  Temp:   Resp:     Complications: No apparent anesthesia complications

## 2014-08-10 NOTE — Anesthesia Procedure Notes (Signed)
Procedure Name: MAC Date/Time: 08/10/2014 12:00 PM Performed by: Garrison Columbus T Pre-anesthesia Checklist: Timeout performed, Patient being monitored, Suction available, Emergency Drugs available and Patient identified Patient Re-evaluated:Patient Re-evaluated prior to inductionOxygen Delivery Method: Nasal cannula Placement Confirmation: positive ETCO2

## 2014-08-10 NOTE — Anesthesia Postprocedure Evaluation (Signed)
  Anesthesia Post-op Note  Patient: [name]  Procedure(s) Performed: Procedure(s): COLONOSCOPY WITH PROPOFOL (N/A)  Patient Location: PACU  Anesthesia Type:MAC  Level of Consciousness: awake and alert   Airway and Oxygen Therapy: Patient Spontanous Breathing and Patient connected to nasal cannula oxygen  Post-op Pain: mild  Post-op Assessment: Post-op Vital signs reviewed and Patient's Cardiovascular Status Stable  Post-op Vital Signs: Reviewed and stable  Last Vitals:  Filed Vitals:   08/10/14 1235  BP: 127/59  Pulse: 71  Temp:   Resp:     Complications: No apparent anesthesia complications

## 2014-08-10 NOTE — Discharge Instructions (Signed)
Follow with Primary MD Elsie Stain, MD in 7 days   Get CBC, CMP, 2 view Chest X ray checked  by Primary MD next visit.    Activity: As tolerated with Full fall precautions use walker/cane & assistance as needed   Disposition Home    Diet: Heart Healthy Low Carb.  For Heart failure patients - Check your Weight same time everyday, if you gain over 2 pounds, or you develop in leg swelling, experience more shortness of breath or chest pain, call your Primary MD immediately. Follow Cardiac Low Salt Diet and 1.5 lit/day fluid restriction.   On your next visit with your primary care physician please Get Medicines reviewed and adjusted.   Please request your Prim.MD to go over all Hospital Tests and Procedure/Radiological results at the follow up, please get all Hospital records sent to your Prim MD by signing hospital release before you go home.   If you experience worsening of your admission symptoms, develop shortness of breath, life threatening emergency, suicidal or homicidal thoughts you must seek medical attention immediately by calling 911 or calling your MD immediately  if symptoms less severe.  You Must read complete instructions/literature along with all the possible adverse reactions/side effects for all the Medicines you take and that have been prescribed to you. Take any new Medicines after you have completely understood and accpet all the possible adverse reactions/side effects.   Do not drive, operating heavy machinery, perform activities at heights, swimming or participation in water activities or provide baby sitting services if your were admitted for syncope or siezures until you have seen by Primary MD or a Neurologist and advised to do so again.  Do not drive when taking Pain medications.    Do not take more than prescribed Pain, Sleep and Anxiety Medications  Special Instructions: If you have smoked or chewed Tobacco  in the last 2 yrs please stop smoking, stop any  regular Alcohol  and or any Recreational drug use.  Wear Seat belts while driving.   Please note  You were cared for by a hospitalist during your hospital stay. If you have any questions about your discharge medications or the care you received while you were in the hospital after you are discharged, you can call the unit and asked to speak with the hospitalist on call if the hospitalist that took care of you is not available. Once you are discharged, your primary care physician will handle any further medical issues. Please note that NO REFILLS for any discharge medications will be authorized once you are discharged, as it is imperative that you return to your primary care physician (or establish a relationship with a primary care physician if you do not have one) for your aftercare needs so that they can reassess your need for medications and monitor your lab values.

## 2014-08-10 NOTE — Anesthesia Preprocedure Evaluation (Addendum)
Anesthesia Evaluation   Patient awake    Reviewed: Allergy & Precautions, NPO status , Patient's Chart, lab work & pertinent test results, reviewed documented beta blocker date and time   Airway Mallampati: II   Neck ROM: Full    Dental  (+) Dental Advisory Given, Teeth Intact   Pulmonary former smoker,  breath sounds clear to auscultation        Cardiovascular hypertension, Pt. on medications + CAD and +CHF + Cardiac Defibrillator (checked often) Rhythm:Regular  EF 25% both by ECHO and Myo view 2010   Neuro/Psych    GI/Hepatic GERD-  Medicated,RO GI BLEED   Endo/Other  diabetes, Insulin Dependent  Renal/GU Renal InsufficiencyRenal diseaseGFR 24     Musculoskeletal   Abdominal (+) + obese,   Peds  Hematology 11/35   Anesthesia Other Findings   Reproductive/Obstetrics                            Anesthesia Physical Anesthesia Plan  ASA: III  Anesthesia Plan: MAC   Post-op Pain Management:    Induction: Intravenous  Airway Management Planned: Nasal Cannula  Additional Equipment:   Intra-op Plan:   Post-operative Plan:   Informed Consent: I have reviewed the patients History and Physical, chart, labs and discussed the procedure including the risks, benefits and alternatives for the proposed anesthesia with the patient or authorized representative who has indicated his/her understanding and acceptance.     Plan Discussed with:   Anesthesia Plan Comments:         Anesthesia Quick Evaluation

## 2014-08-11 ENCOUNTER — Encounter (HOSPITAL_COMMUNITY): Payer: Self-pay | Admitting: Gastroenterology

## 2014-08-11 ENCOUNTER — Telehealth: Payer: Self-pay | Admitting: Internal Medicine

## 2014-08-11 NOTE — Telephone Encounter (Signed)
Unable to leave a message, voicemail has not been set up.  Patient needs follow up with his primary care MD, GI PRN

## 2014-08-11 NOTE — Care Management Note (Signed)
    Page 1 of 1   08/11/2014     8:51:39 AM CARE MANAGEMENT NOTE 08/11/2014  Patient:  [name], [name]   Account Number:  [id number]  Date Initiated:  08/11/2014  Documentation initiated by:  Jovoni Borkenhagen  Subjective/Objective Assessment:   dx GI Bleed; lives with spouse    PCP  Elsie Stain     Anticipated DC Date:  08/10/2014   Anticipated DC Plan:  HOME/SELF CARE  Status of service:  Completed, signed off Medicare Important Message given?  NA - LOS <3 / Initial given by admissions (If response is "NO", the following Medicare IM given date fields will be blank) Date Medicare IM given:   Medicare IM given by:   Date Additional Medicare IM given:   Additional Medicare IM given by:    Discharge Disposition:  HOME/SELF CARE  Per UR Regulation:  Reviewed for med. necessity/level of care/duration of stay

## 2014-08-15 NOTE — Telephone Encounter (Signed)
Unable to leave a message, voicemail is not set up. I will await a return call from the patient

## 2014-08-18 ENCOUNTER — Ambulatory Visit (INDEPENDENT_AMBULATORY_CARE_PROVIDER_SITE_OTHER): Payer: Medicare Other | Admitting: Family Medicine

## 2014-08-18 ENCOUNTER — Encounter: Payer: Self-pay | Admitting: Family Medicine

## 2014-08-18 VITALS — BP 130/66 | HR 66 | Temp 97.7°F | Wt 218.2 lb

## 2014-08-18 DIAGNOSIS — K625 Hemorrhage of anus and rectum: Secondary | ICD-10-CM

## 2014-08-18 DIAGNOSIS — K5731 Diverticulosis of large intestine without perforation or abscess with bleeding: Secondary | ICD-10-CM | POA: Diagnosis not present

## 2014-08-18 NOTE — Patient Instructions (Addendum)
Go to the lab on the way out.  We'll contact you with your lab report. Hold ibuprofen and aspirin for now.  Take tylenol if needed for pain.  Take care.  Glad to see you.

## 2014-08-18 NOTE — Progress Notes (Signed)
Pre visit review using our clinic review tool, if applicable. No additional management support is needed unless otherwise documented below in the visit note.  Admitted with GIB.  To recap: GD and colonoscopy done.  No transfusion needed.  Started on PPI.  ASA held.  Due for f/u labs today.  No BMs with blood now, none since coming home.  Not lightheaded but still weak and tired.    He had used OTC liniment with relief of his R shoulder pain.    PMH and SH reviewed  ROS: See HPI, otherwise noncontributory.  Meds, vitals, and allergies reviewed.   GEN: nad, alert and oriented HEENT: mucous membranes moist NECK: supple w/o LA CV: rrr.  PULM: ctab, no inc wob ABD: soft, +bs EXT: no edema SKIN: no acute rash

## 2014-08-19 ENCOUNTER — Encounter: Payer: Self-pay | Admitting: Family Medicine

## 2014-08-19 ENCOUNTER — Other Ambulatory Visit: Payer: Self-pay | Admitting: Family Medicine

## 2014-08-19 DIAGNOSIS — E875 Hyperkalemia: Secondary | ICD-10-CM

## 2014-08-19 LAB — CBC WITH DIFFERENTIAL/PLATELET
BASOS PCT: 0.9 % (ref 0.0–3.0)
Basophils Absolute: 0.1 10*3/uL (ref 0.0–0.1)
EOS PCT: 5.3 % — AB (ref 0.0–5.0)
Eosinophils Absolute: 0.4 10*3/uL (ref 0.0–0.7)
HEMATOCRIT: 38.3 % — AB (ref 39.0–52.0)
Hemoglobin: 13 g/dL (ref 13.0–17.0)
LYMPHS ABS: 1.4 10*3/uL (ref 0.7–4.0)
Lymphocytes Relative: 19.5 % (ref 12.0–46.0)
MCHC: 33.9 g/dL (ref 30.0–36.0)
MCV: 86.8 fl (ref 78.0–100.0)
MONOS PCT: 8 % (ref 3.0–12.0)
Monocytes Absolute: 0.6 10*3/uL (ref 0.1–1.0)
Neutro Abs: 4.8 10*3/uL (ref 1.4–7.7)
Neutrophils Relative %: 66.3 % (ref 43.0–77.0)
Platelets: 195 10*3/uL (ref 150.0–400.0)
RBC: 4.42 Mil/uL (ref 4.22–5.81)
RDW: 14.8 % (ref 11.5–15.5)
WBC: 7.3 10*3/uL (ref 4.0–10.5)

## 2014-08-19 LAB — BASIC METABOLIC PANEL
BUN: 48 mg/dL — AB (ref 6–23)
CALCIUM: 8.7 mg/dL (ref 8.4–10.5)
CO2: 23 mEq/L (ref 19–32)
CREATININE: 2.79 mg/dL — AB (ref 0.40–1.50)
Chloride: 104 mEq/L (ref 96–112)
GFR: 23.01 mL/min — ABNORMAL LOW (ref >60.00)
GLUCOSE: 342 mg/dL — AB (ref 70–99)
Potassium: 5.6 mEq/L — ABNORMAL HIGH (ref 3.5–5.1)
Sodium: 132 mEq/L — ABNORMAL LOW (ref 135–145)

## 2014-08-19 MED ORDER — SODIUM POLYSTYRENE SULFONATE 15 GM/60ML PO SUSP: 15.0000 g | Freq: Once | ORAL | Status: DC

## 2014-08-19 NOTE — Assessment & Plan Note (Signed)
Likely the source of the bleeding.  D/w pt about hospital course and anatomy.  Hold ASA and nsaids for now.  No more bleeding.  abd not ttp.  Doing well w/o sx now.  Recheck labs today and will notify pt.  Will consider restart ASA about 2 weeks out from event if CBC is unremarkable and no more bleeding.  D/w pt.   >25 minutes spent in face to face time with patient, >50% spent in counselling or coordination of care.

## 2014-08-22 ENCOUNTER — Other Ambulatory Visit (INDEPENDENT_AMBULATORY_CARE_PROVIDER_SITE_OTHER): Payer: Medicare Other

## 2014-08-22 DIAGNOSIS — E875 Hyperkalemia: Secondary | ICD-10-CM

## 2014-08-22 LAB — BASIC METABOLIC PANEL
BUN: 51 mg/dL — ABNORMAL HIGH (ref 6–23)
CO2: 25 meq/L (ref 19–32)
Calcium: 9.1 mg/dL (ref 8.4–10.5)
Chloride: 105 mEq/L (ref 96–112)
Creatinine, Ser: 3.07 mg/dL — ABNORMAL HIGH (ref 0.40–1.50)
GFR: 20.6 mL/min — AB (ref >60.00)
Glucose, Bld: 168 mg/dL — ABNORMAL HIGH (ref 70–99)
POTASSIUM: 4.6 meq/L (ref 3.5–5.1)
SODIUM: 136 meq/L (ref 135–145)

## 2014-08-23 ENCOUNTER — Ambulatory Visit: Payer: Medicare Other

## 2014-08-23 ENCOUNTER — Encounter: Payer: Self-pay | Admitting: Family Medicine

## 2014-08-23 ENCOUNTER — Ambulatory Visit (INDEPENDENT_AMBULATORY_CARE_PROVIDER_SITE_OTHER): Payer: Medicare Other | Admitting: Family Medicine

## 2014-08-23 ENCOUNTER — Encounter: Payer: Medicare Other | Admitting: Internal Medicine

## 2014-08-23 VITALS — BP 124/62 | HR 73 | Temp 97.6°F | Wt 217.5 lb

## 2014-08-23 DIAGNOSIS — R531 Weakness: Secondary | ICD-10-CM

## 2014-08-23 DIAGNOSIS — E291 Testicular hypofunction: Secondary | ICD-10-CM

## 2014-08-23 DIAGNOSIS — R7989 Other specified abnormal findings of blood chemistry: Secondary | ICD-10-CM

## 2014-08-23 MED ORDER — TESTOSTERONE CYPIONATE 200 MG/ML IM SOLN
200.0000 mg | Freq: Once | INTRAMUSCULAR | Status: AC
  Administered 2014-08-23: 200 mg via INTRAMUSCULAR

## 2014-08-23 NOTE — Progress Notes (Signed)
Pre visit review using our clinic review tool, if applicable. No additional management support is needed unless otherwise documented below in the visit note.  He has f/u with the renal clinic at the end of the month.  K is improved, Cr is slightly worse on last check.  D/w pt.    He still feels weak from the last hospitalization.  "I'm not coming back like I was expecting."  He has felt diffusely weak, no focal changes.  Last HGB was improved from inpatient levels.  He still has muscular pain near the R shoulder/scapula, improved with a massage and tylenol.  He has known B knee OA and his R hip has been hurting also.  Sleep disrupted from pain and that may be contributing.  He has been using OTC liniment on his joints for aches. Tylenol helps some with the pain, he is taking that before going to bed, but only 1 regular strength at a time.    No more blood per rectum.  Still off aspirin otherwise.    Due for T injection, last done in 06/2014.  He has been working more on his diet since the last lab draw that had elevated K.    Meds, vitals, and allergies reviewed.   ROS: See HPI.  Otherwise, noncontributory.  nad ncat Mmm Necks supple, no LA rrr ctab R lat dorsi and trap ttp but w/o bruising or rash.   No edema in BLE  abd soft, not ttp, normal BS

## 2014-08-23 NOTE — Patient Instructions (Signed)
Take 2 tylenol at night and see if that helps you sleep better.  I'll check with the renal clinic in the meantime.  Low sugar, low potassium diet.  Take care.

## 2014-08-24 DIAGNOSIS — R531 Weakness: Secondary | ICD-10-CM | POA: Insufficient documentation

## 2014-08-24 NOTE — Assessment & Plan Note (Signed)
Likely multifactorial.  Needed kayexalate due to dietary indiscretion and high K prev, now back to normal.  CKD likely contributes.   Sleep likely disrupted from MSK concerns- can use tylenol, no nsaids, continue massage and liniment.  Needs to stick to DM2 diet and meds, d/w pt.   He isn't bleeding now, but hasn't likely fully recovered from GI bleed.  Still off ASA.   I didn't change his meds with his Cr at 3- will notify renal for their input.  This still near his baseline.   Dosed today for low T.  All of the above likely contribute, d/w pt.  He agrees.  He'll update me as we go along.   >25 minutes spent in face to face time with patient, >50% spent in counselling or coordination of care.

## 2014-08-26 ENCOUNTER — Telehealth: Payer: Self-pay

## 2014-08-26 NOTE — Telephone Encounter (Signed)
Copy mailed to patient.

## 2014-08-26 NOTE — Telephone Encounter (Signed)
We talked about it at the [facility] (and about his renal function), please send him a copy.  Thanks.

## 2014-08-26 NOTE — Telephone Encounter (Signed)
Pt was seen on 08/23/14 but said he did not get 08/22/14 lab results at that appt. Pt request cb.

## 2014-08-28 ENCOUNTER — Telehealth: Payer: Self-pay | Admitting: Family Medicine

## 2014-08-28 NOTE — Telephone Encounter (Signed)
Notify pt.  If no more bleeding by the end of the week, then okay to restart aspirin at his prev dose.  You may want to wait until later in the week to call pt.  Thanks.

## 2014-08-30 NOTE — Telephone Encounter (Signed)
I will call patient on Thursday, March 17th.

## 2014-09-01 NOTE — Telephone Encounter (Signed)
Patient advised.

## 2014-09-01 NOTE — Telephone Encounter (Signed)
Thanks

## 2014-09-08 ENCOUNTER — Other Ambulatory Visit: Payer: Self-pay | Admitting: Internal Medicine

## 2014-09-12 ENCOUNTER — Other Ambulatory Visit: Payer: Self-pay | Admitting: Family Medicine

## 2014-09-12 DIAGNOSIS — E1165 Type 2 diabetes mellitus with hyperglycemia: Principal | ICD-10-CM

## 2014-09-12 DIAGNOSIS — IMO0002 Reserved for concepts with insufficient information to code with codable children: Secondary | ICD-10-CM

## 2014-09-12 DIAGNOSIS — E1129 Type 2 diabetes mellitus with other diabetic kidney complication: Secondary | ICD-10-CM

## 2014-09-15 ENCOUNTER — Other Ambulatory Visit (INDEPENDENT_AMBULATORY_CARE_PROVIDER_SITE_OTHER): Payer: Medicare Other

## 2014-09-15 DIAGNOSIS — E1165 Type 2 diabetes mellitus with hyperglycemia: Secondary | ICD-10-CM | POA: Diagnosis not present

## 2014-09-15 DIAGNOSIS — E1129 Type 2 diabetes mellitus with other diabetic kidney complication: Secondary | ICD-10-CM

## 2014-09-15 DIAGNOSIS — IMO0002 Reserved for concepts with insufficient information to code with codable children: Secondary | ICD-10-CM

## 2014-09-15 LAB — HEMOGLOBIN A1C: Hgb A1c MFr Bld: 9.8 % — ABNORMAL HIGH (ref 4.6–6.5)

## 2014-09-19 ENCOUNTER — Other Ambulatory Visit: Payer: Self-pay

## 2014-09-20 ENCOUNTER — Ambulatory Visit (INDEPENDENT_AMBULATORY_CARE_PROVIDER_SITE_OTHER): Payer: Medicare Other | Admitting: Internal Medicine

## 2014-09-20 ENCOUNTER — Encounter: Payer: Self-pay | Admitting: Internal Medicine

## 2014-09-20 VITALS — BP 134/70 | HR 68 | Ht 73.0 in | Wt 218.2 lb

## 2014-09-20 DIAGNOSIS — Z95810 Presence of automatic (implantable) cardiac defibrillator: Secondary | ICD-10-CM

## 2014-09-20 DIAGNOSIS — I10 Essential (primary) hypertension: Secondary | ICD-10-CM

## 2014-09-20 DIAGNOSIS — I5022 Chronic systolic (congestive) heart failure: Secondary | ICD-10-CM

## 2014-09-20 DIAGNOSIS — N5201 Erectile dysfunction due to arterial insufficiency: Secondary | ICD-10-CM | POA: Diagnosis not present

## 2014-09-20 DIAGNOSIS — I255 Ischemic cardiomyopathy: Secondary | ICD-10-CM | POA: Diagnosis not present

## 2014-09-20 LAB — MDC_IDC_ENUM_SESS_TYPE_INCLINIC
Battery Voltage: 2.84 V
Brady Statistic AP VP Percent: 0.62 %
Brady Statistic AP VS Percent: 0 %
Date Time Interrogation Session: 20160405165321
HIGH POWER IMPEDANCE MEASURED VALUE: 77 Ohm
HighPow Impedance: 52 Ohm
Lead Channel Impedance Value: 380 Ohm
Lead Channel Impedance Value: 380 Ohm
Lead Channel Impedance Value: 532 Ohm
Lead Channel Impedance Value: 627 Ohm
Lead Channel Pacing Threshold Amplitude: 1.25 V
Lead Channel Pacing Threshold Pulse Width: 0.4 ms
Lead Channel Pacing Threshold Pulse Width: 0.6 ms
Lead Channel Pacing Threshold Pulse Width: 1 ms
Lead Channel Sensing Intrinsic Amplitude: 3.625 mV
Lead Channel Setting Pacing Amplitude: 2.5 V
Lead Channel Setting Pacing Pulse Width: 0.4 ms
Lead Channel Setting Pacing Pulse Width: 1 ms
MDC IDC MSMT LEADCHNL LV IMPEDANCE VALUE: 836 Ohm
MDC IDC MSMT LEADCHNL LV PACING THRESHOLD AMPLITUDE: 1.25 V
MDC IDC MSMT LEADCHNL RA PACING THRESHOLD AMPLITUDE: 1 V
MDC IDC MSMT LEADCHNL RV SENSING INTR AMPL: 9.625 mV
MDC IDC SET LEADCHNL LV PACING AMPLITUDE: 2.25 V
MDC IDC SET LEADCHNL RA PACING AMPLITUDE: 2 V
MDC IDC SET LEADCHNL RV SENSING SENSITIVITY: 0.3 mV
MDC IDC SET ZONE DETECTION INTERVAL: 350 ms
MDC IDC STAT BRADY AS VP PERCENT: 99.29 %
MDC IDC STAT BRADY AS VS PERCENT: 0.09 %
MDC IDC STAT BRADY RA PERCENT PACED: 0.62 %
MDC IDC STAT BRADY RV PERCENT PACED: 99.91 %
Zone Setting Detection Interval: 300 ms
Zone Setting Detection Interval: 350 ms
Zone Setting Detection Interval: 400 ms

## 2014-09-20 NOTE — Assessment & Plan Note (Signed)
His symptoms are now class II. He will continue his current medical therapy. He is encouraged to remain active and maintain a low-sodium diet.

## 2014-09-20 NOTE — Assessment & Plan Note (Signed)
His Medtronic biventricular ICD is working normally, and he will continue his current settings. We'll plan to recheck in several months.

## 2014-09-20 NOTE — Assessment & Plan Note (Signed)
We discussed options for treatment. He has taken Viagra and Cialis in the past. He is encouraged to try these medications but he is also warned not to use any nitroglycerin or nitrate containing medications.

## 2014-09-20 NOTE — Assessment & Plan Note (Signed)
His blood pressure is well controlled. No change in medical therapy. 

## 2014-09-20 NOTE — Patient Instructions (Signed)
Remote monitoring is used to monitor your Pacemaker of ICD from home. This monitoring reduces the number of office visits required to check your device to one time per year. It allows [place] to keep an eye on the functioning of your device to ensure it is working properly. You are scheduled for a device check from home on 12/20/2014. You may send your transmission at any time that day. If you have a wireless device, the transmission will be sent automatically. After your physician reviews your transmission, you will receive a postcard with your next transmission date.  Your physician wants you to follow-up in: [place].  You will receive a reminder letter in the mail two months in advance. If you don't receive a letter, please call our office to schedule the follow-up appointment.  Your physician recommends that you continue on your current medications as directed. Please refer to the Current Medication list given to you today.

## 2014-09-20 NOTE — Progress Notes (Signed)
HPI [name] returns today for ICD followup. He is a very pleasant 81 year old man with chronic systolic heart failure, and ischemic cardiomyopathy, status post ICD insertion. He has chronic left bundle branch block and is status post biventricular device implantation. His heart failure symptoms are class II. He has begun taking testosterone injections but remains sedentary with bad knees. His Hgb A1C has been elevated. He has asked about taking viagra or cialis. In addition, the patient notes a history of gastrointestinal bleeding secondary to diverticulitis was successfully cauterized. He did not require transfusion. Allergies  Allergen Reactions  . Celecoxib     REACTION: myalgia: abdominal pain  . Nsaids     REACTION: renal disease  . Rosuvastatin     REACTION: myalgia: musle pain     Current Outpatient Prescriptions  Medication Sig Dispense Refill  . B-D ULTRAFINE III SHORT PEN 31G X 8 MM MISC USE AS DIRECTED 100 each 3  . carvedilol (COREG) 12.5 MG tablet TAKE 1 TABLET BY MOUTH TWICE A DAY 180 tablet 0  . cholecalciferol (VITAMIN D) 1000 UNITS tablet Take 2,000 Units by mouth daily.    [name] glucose blood test strip 1 each by Other route 2 (two) times daily as needed for other. (Patient taking differently: 1 each by Other route daily. ) 200 each 3  . Insulin Glargine (LANTUS) 100 UNIT/ML Solostar Pen Inject 28 Units into the skin daily.    [name] lisinopril (PRINIVIL,ZESTRIL) 2.5 MG tablet TAKE 1 TABLET BY MOUTH DAILY 30 tablet 5  . nitroGLYCERIN (NITROSTAT) 0.4 MG SL tablet Place 1 tablet (0.4 mg total) under the tongue every 5 (five) minutes as needed. (Patient taking differently: Place 0.4 mg under the tongue every 5 (five) minutes as needed for chest pain (MAX 3 TABLETS). ) 25 tablet 6  . Nutritional Supplements (GLUCERNA SHAKE PO) Take 4 oz by mouth daily after breakfast.      . pravastatin (PRAVACHOL) 40 MG tablet TAKE 1 TABLET BY MOUTH AT BEDTIME 30 tablet 8  . testosterone cypionate  (DEPOTESTOTERONE CYPIONATE) 200 MG/ML injection INJECT 1.25ML INTO THE MUSCLE EVERY 28 DAYS 10 mL 0   No current facility-administered medications for this visit.     Past Medical History  Diagnosis Date  . HTN (hypertension) (06/17/1981)  . Diverticula of colon (06/18/1983)  . Renal insufficiency   . Hyperlipidemia 06/17/1993)  . Diabetes mellitus  (06/17/1997)  . PUD (peptic ulcer disease) 1960's  . Pancreatitis 01/24 - 07/13/08    ABD CT  fatty liver early pancreatitis  07/12/2008  Pelvic CT Nml 07/12/2008  . Cardiomyopathy     EF 25% Ant Akinesis Global Hypo 3/2-08/18/08  . Thyroid nodule     per Dr. Brantley Stage  . Renal disease     per Dr. Posey Pronto  . Hypogonadism male   . GIB (gastrointestinal bleeding)     admission 07/2014, likely diverticular bleeding    ROS:   All systems reviewed and negative except as noted in the HPI.   Past Surgical History  Procedure Laterality Date  . Colonoscopy  12/2007    4 mm sigmoid HP polyp, diverticulosis. random biopsies negative for colitis.   . Cardiac catheterization       Severe 3-vessel coronary artery disease.  Ischemic   cardiomyopathy.   . Nephrectomy  01/09/09    Partial left nephrectomy. Clear Cell Renal Cell Carcinoma 01/09/2009  . Cardiac defibrillator placement      ICD-Medtronic .  [id number]  . Laparotomy  small mid 80's (laparotomy), 09/12/2000 (no surgery), 10/20/2008 (no surgery)  . Lesion removal      RLE 11/14/2006 (Dr Hulen Skains)   . Ct of abdomen  06/1997    Fatty process unchanged - myelolipoma adrenal unchangd  . Ct of abdomen  02/99    Adrenal mass unchanged, no further follow up required  . Sbo  08/2000 no surgery   10/20/08 no surgery    Mid 80's laparotomy  . Esophagogastroduodenoscopy  07/12/2008    Schatzki Ring 2 cm. HH (Dr. Deatra Ina)  . Abdominal US  07/12/08    Fatty infiltrate liver complex cyst in lower pole left kidney  . Icd/ crt (lv lead disabled)      Medtronic [id number]  . Coronary artery bypass  graft  09/05/08     Mediastinotomy, extracorporeal circulation,   coronary artery bypass graft surgery x4 using a left internal mammary  artery graft to the left anterior descending coronary artery, with a  saphenous vein graft to the first diagonal branch of the LAD, a   saphenous vein graft to the obtuse marginal branch of the left   circumflex coronary artery,  Dr. Cyndia Bent  . Cholecystectomy  1970's  . Esophagogastroduodenoscopy N/A 08/09/2014    Procedure: ESOPHAGOGASTRODUODENOSCOPY (EGD);  Surgeon: Ladene Artist, MD;  Location: [facility] ENDOSCOPY;  Service: Endoscopy;  Laterality: N/A;  . Colonoscopy with propofol N/A 08/10/2014    Procedure: COLONOSCOPY WITH PROPOFOL;  Surgeon: Ladene Artist, MD;  Location: [facility] ENDOSCOPY;  Service: Endoscopy;  Laterality: N/A;     Family History  Problem Relation Age of Onset  . Angina Father   . Hypertension Father   . Heart disease Father     MI, angina  . Breast cancer Mother   . Cancer Mother     Breast  . Heart disease Mother     Valve disease  . Cancer Brother     Brain tumor  . Colon cancer Neg Hx   . Prostate cancer Neg Hx      History   Social History  . Marital Status: Married    Spouse Name: N/A  . Number of Children: 2  . Years of Education: N/A   Occupational History  . Retired from [employer]    Social History Main Topics  . Smoking status: Former Smoker    Quit date: 06/17/1938  . Smokeless tobacco: Never Used  . Alcohol Use: 0.0 oz/week    0 Standard drinks or equivalent per week     Comment: Rare  . Drug Use: No  . Sexual Activity: Not on file   Other Topics Concern  . Not on file   Social History Narrative    The patient is retired.  He has been married since age        of 75.  They have 2 children.  They have grandchildren and great        grandchildren.       BP 134/70 mmHg  Pulse 68  Ht 6\' 1"  (1.854 m)  Wt 218 lb 3.2 oz (98.975 kg)  BMI 28.79 kg/m2  Physical  Exam:  Well appearing elderly man,NAD HEENT: Unremarkable Neck:  7 cm JVD, no thyromegally Lungs:  Clear with no wheezes, rales, or rhonchi. HEART:  Regular rate rhythm, no murmurs, no rubs, no clicks Abd:  soft, positive bowel sounds, no organomegally, no rebound, no guarding Ext:  2 plus pulses, no edema, no cyanosis, no clubbing Skin:  No rashes no nodules Neuro:  CN II through XII intact, motor grossly intact   DEVICE  Normal device function.  See PaceArt for details.   Assess/Plan:

## 2014-09-21 ENCOUNTER — Telehealth: Payer: Self-pay | Admitting: Internal Medicine

## 2014-09-21 ENCOUNTER — Ambulatory Visit (INDEPENDENT_AMBULATORY_CARE_PROVIDER_SITE_OTHER): Payer: Medicare Other | Admitting: *Deleted

## 2014-09-21 DIAGNOSIS — I255 Ischemic cardiomyopathy: Secondary | ICD-10-CM

## 2014-09-21 LAB — MDC_IDC_ENUM_SESS_TYPE_INCLINIC
Lead Channel Pacing Threshold Amplitude: 1.25 V
MDC IDC MSMT LEADCHNL RV PACING THRESHOLD PULSEWIDTH: 0.4 ms

## 2014-09-21 NOTE — Telephone Encounter (Signed)
Follow up      Pt saw [name] today.  The problem is back----everytime his heart beat, his device make his left side jump (something like that).  Please call when you get back from lunch

## 2014-09-21 NOTE — Telephone Encounter (Signed)
New Message  Pt called to speak w/ Cyril Mourning because the problem they were addressing has not improved. Pt requests to be called back today. Please call back and discuss.

## 2014-09-21 NOTE — Telephone Encounter (Signed)
Follow Up       Pt calling back today complaining that he hasn't heard back from anyone today. Pt states if he doesn't hear back from someone today he will be up here first thing in the morning for someone to adjust his device. Please call back and advise.

## 2014-09-21 NOTE — Progress Notes (Signed)
Pt checked on 09-20-14 with no changes made to device. Pt called in on 09-21-14 with "thumping". Pt checked and RV capture management changed output to 3.25 @ 0.40 ms. RV threshold testing ran manually with a threshold of 1.25 @ 0.40. RV capture management turned to monitor only and RV output set at 2.50 @ 0.40. Follow up as planned.

## 2014-09-21 NOTE — Telephone Encounter (Signed)
Pt's defib pulsed all night and he didn't get any sleep, this happened before and we adjusted it so it stopped, after coming in yesterday and having it adjusted again it started doing it again-wants to come in asap to get it adjusted again

## 2014-09-21 NOTE — Telephone Encounter (Signed)
New Message °

## 2014-09-22 ENCOUNTER — Ambulatory Visit: Payer: Self-pay | Admitting: Family Medicine

## 2014-09-22 ENCOUNTER — Encounter: Payer: Self-pay | Admitting: Internal Medicine

## 2014-09-22 ENCOUNTER — Ambulatory Visit (INDEPENDENT_AMBULATORY_CARE_PROVIDER_SITE_OTHER): Payer: Medicare Other | Admitting: *Deleted

## 2014-09-22 DIAGNOSIS — I5022 Chronic systolic (congestive) heart failure: Secondary | ICD-10-CM

## 2014-09-22 DIAGNOSIS — I255 Ischemic cardiomyopathy: Secondary | ICD-10-CM

## 2014-09-22 LAB — MDC_IDC_ENUM_SESS_TYPE_INCLINIC
Brady Statistic AP VS Percent: 0 %
Brady Statistic AS VP Percent: 99.06 %
Brady Statistic AS VS Percent: 0.23 %
Brady Statistic RA Percent Paced: 0.71 %
Brady Statistic RV Percent Paced: 99.76 %
Date Time Interrogation Session: 20160407135331
HighPow Impedance: 46 Ohm
HighPow Impedance: 69 Ohm
Lead Channel Impedance Value: 532 Ohm
Lead Channel Impedance Value: 646 Ohm
Lead Channel Impedance Value: 836 Ohm
Lead Channel Pacing Threshold Amplitude: 0.5 V
Lead Channel Pacing Threshold Amplitude: 1 V
Lead Channel Pacing Threshold Amplitude: 1.625 V
Lead Channel Pacing Threshold Pulse Width: 0.4 ms
Lead Channel Pacing Threshold Pulse Width: 1 ms
Lead Channel Sensing Intrinsic Amplitude: 2 mV
Lead Channel Sensing Intrinsic Amplitude: 26.375 mV
Lead Channel Sensing Intrinsic Amplitude: 3.75 mV
Lead Channel Sensing Intrinsic Amplitude: 7.75 mV
Lead Channel Setting Pacing Amplitude: 2.5 V
Lead Channel Setting Pacing Pulse Width: 0.4 ms
Lead Channel Setting Sensing Sensitivity: 0.3 mV
MDC IDC MSMT BATTERY VOLTAGE: 2.82 V
MDC IDC MSMT LEADCHNL LV IMPEDANCE VALUE: 380 Ohm
MDC IDC MSMT LEADCHNL RV IMPEDANCE VALUE: 418 Ohm
MDC IDC MSMT LEADCHNL RV PACING THRESHOLD PULSEWIDTH: 0.4 ms
MDC IDC SET LEADCHNL LV PACING AMPLITUDE: 2.25 V
MDC IDC SET LEADCHNL LV PACING PULSEWIDTH: 1 ms
MDC IDC SET LEADCHNL RA PACING AMPLITUDE: 2 V
MDC IDC SET ZONE DETECTION INTERVAL: 350 ms
MDC IDC SET ZONE DETECTION INTERVAL: 400 ms
MDC IDC STAT BRADY AP VP PERCENT: 0.71 %
Zone Setting Detection Interval: 300 ms
Zone Setting Detection Interval: 350 ms

## 2014-09-22 NOTE — Telephone Encounter (Signed)
Pt seen in office 09-22-14

## 2014-09-23 ENCOUNTER — Telehealth: Payer: Self-pay | Admitting: *Deleted

## 2014-09-23 NOTE — Progress Notes (Signed)
CRT-D device check in office due to "thumping" of L sided abdominal mass (N/C). Patient layed on R side and d.stimulation occurred immediately. LV threshold testing was performed at all three configurations with a 1.54ms PW. LV config reporgrammed from (LVtip-RVc) to (LVtip-LVring---836ohms) with a +0.5V safety margin. Patient layed on both sides and performed a few stretching exercises without recurrence of stim. RA/RV thresholds and sensing consistent with previous device measurements. Lead impedance trends stable over time. No mode switch episodes recorded. No ventricular arrhythmia episodes recorded. Patient bi-ventricularly pacing 99.3% of the time. Device programmed with appropriate safety margins. Heart failure diagnostics reviewed and trends are stable for patient. Batt voltage 2.82V (ERI 2.63V).  Patient will follow up as scheduled.

## 2014-09-23 NOTE — Telephone Encounter (Signed)
Called to check on pt post CRT programming changes. Patient states that he did not experience the "thumping" sensation last night. Will continue F/U as scheduled.

## 2014-09-27 ENCOUNTER — Encounter: Payer: Self-pay | Admitting: Family Medicine

## 2014-09-27 ENCOUNTER — Ambulatory Visit (INDEPENDENT_AMBULATORY_CARE_PROVIDER_SITE_OTHER): Payer: Medicare Other | Admitting: Family Medicine

## 2014-09-27 VITALS — BP 138/60 | HR 67 | Temp 97.6°F | Wt 219.5 lb

## 2014-09-27 DIAGNOSIS — Z23 Encounter for immunization: Secondary | ICD-10-CM | POA: Diagnosis not present

## 2014-09-27 DIAGNOSIS — E1165 Type 2 diabetes mellitus with hyperglycemia: Secondary | ICD-10-CM

## 2014-09-27 DIAGNOSIS — E1129 Type 2 diabetes mellitus with other diabetic kidney complication: Secondary | ICD-10-CM | POA: Diagnosis not present

## 2014-09-27 DIAGNOSIS — IMO0002 Reserved for concepts with insufficient information to code with codable children: Secondary | ICD-10-CM

## 2014-09-27 NOTE — Patient Instructions (Addendum)
Check on your eye appointment.  Make sure that is scheduled.  Recheck labs before a visit in about 3 months.  Increase your lantus by 1 unit per day if your AM sugar is >130.  If less then 90, then decrease by 1 unit.  Cut back on the sweet tea.   Take care. Glad to see you.

## 2014-09-27 NOTE — Progress Notes (Signed)
Pre visit review using our clinic review tool, if applicable. No additional management support is needed unless otherwise documented below in the visit note.  Diabetes:  Using medications without difficulties:yes Hypoglycemic episodes: no Hyperglycemic episodes: yes, see below, no sx Feet problems: no Blood Sugars averaging: 160+ in AMs and that is improved recently.  Up to 235 in the AM eye exam within last year: done in spring 2015, f/u pending.   His knee pain is limiting his exercise.   PNA vaccine d/w pt.  "I've been a little liberal with the sugar." A1c d/w pt.  Still uncontrolled.    He declined cialis use, in case he needed NTG.    Meds, vitals, and allergies reviewed.   ROS: See HPI.  Otherwise negative.    GEN: nad, alert and oriented HEENT: mucous membranes moist NECK: supple w/o LA CV: rrr. PULM: ctab, no inc wob ABD: soft, +bs EXT: trace BLE edema

## 2014-09-28 NOTE — Assessment & Plan Note (Signed)
Still uncontrolled, I tried to stress this to patient.  D/w pt about diet and insulin titration.  I don't expect this to get better w/o work on both, and I told him that.  See AVS, recheck in 3 months.  He agrees.

## 2014-10-03 ENCOUNTER — Encounter: Payer: Self-pay | Admitting: Internal Medicine

## 2014-10-03 ENCOUNTER — Ambulatory Visit (INDEPENDENT_AMBULATORY_CARE_PROVIDER_SITE_OTHER): Payer: Medicare Other | Admitting: Internal Medicine

## 2014-10-03 VITALS — BP 130/72 | HR 76 | Ht 73.0 in | Wt 217.1 lb

## 2014-10-03 DIAGNOSIS — R140 Abdominal distension (gaseous): Secondary | ICD-10-CM | POA: Diagnosis not present

## 2014-10-03 DIAGNOSIS — K5731 Diverticulosis of large intestine without perforation or abscess with bleeding: Secondary | ICD-10-CM

## 2014-10-03 DIAGNOSIS — R194 Change in bowel habit: Secondary | ICD-10-CM | POA: Diagnosis not present

## 2014-10-03 NOTE — Patient Instructions (Signed)
Follow up as needed If diarrhea persist please call so we can get a stool sample to rule out infection. Begin Benefiber 2 tablespoons daily. We have given you a low gas diet to follow. CC:  Elsie Stain MD

## 2014-10-03 NOTE — Progress Notes (Signed)
   Subjective:    Patient ID: [name], male    DOB: [birth year], 81 y.o.   MRN: [id number] Cc: f/u GI bleed HPI The patient had hematochezia and was admitted to the hospital in February. EGD showed esophageal and duodenal strictures and gastritis. Colonoscopy showed diverticulosis and hemorrhoids. No bleeding since. Now c/o increased belching and flatulence. Also describes no defecation x several days and then multiple stools, some loose and repeating cycles of this. Past few days has had loose stools w/o fever or significant abdominal pain.  Medications, allergies, past medical history, past surgical history, family history and social history are reviewed and updated in the EMR.  Review of Systems As above, somewhat weak but improving    Objective:   Physical Exam @BP  130/72 mmHg  Pulse 76  Ht 6\' 1"  (1.854 m)  Wt 217 lb 2 oz (98.487 kg)  BMI 28.65 kg/m2@  General:  NAD Eyes:   anicteric Lungs:  clear Heart:: S1S2 no rubs, murmurs or gallops Abdomen:  soft and nontender, BS+ + small reducible umbilical hernia Ext:   no edema, cyanosis or clubbing  Data Reviewed:  EGD/colonoscopy 07/2014 Labs in [facility] and PCP notes 2016    Assessment & Plan:   Altered bowel habits  Flatulence/gas pain/belching  Diverticulosis of colon with hemorrhage - resolved   Benefiber 2 tbsp dail; Gas prevention diet info given If bowel habit issues persist with diarrhea then would check C diff PCR See me prn  I appreciate the opportunity to care for him

## 2014-10-04 ENCOUNTER — Encounter: Payer: Self-pay | Admitting: Internal Medicine

## 2014-10-07 DIAGNOSIS — L84 Corns and callosities: Secondary | ICD-10-CM | POA: Diagnosis not present

## 2014-10-07 DIAGNOSIS — E1151 Type 2 diabetes mellitus with diabetic peripheral angiopathy without gangrene: Secondary | ICD-10-CM | POA: Diagnosis not present

## 2014-10-07 DIAGNOSIS — L602 Onychogryphosis: Secondary | ICD-10-CM | POA: Diagnosis not present

## 2014-10-10 ENCOUNTER — Emergency Department
Admit: 2014-10-10 | Disposition: A | Discharge status: Home / Self Care | Payer: Self-pay | Admitting: Emergency Medicine

## 2014-10-10 DIAGNOSIS — N189 Chronic kidney disease, unspecified: Secondary | ICD-10-CM | POA: Diagnosis not present

## 2014-10-10 DIAGNOSIS — R58 Hemorrhage, not elsewhere classified: Secondary | ICD-10-CM | POA: Diagnosis not present

## 2014-10-10 DIAGNOSIS — N2581 Secondary hyperparathyroidism of renal origin: Secondary | ICD-10-CM | POA: Diagnosis not present

## 2014-10-10 DIAGNOSIS — S0990XA Unspecified injury of head, initial encounter: Secondary | ICD-10-CM | POA: Diagnosis not present

## 2014-10-10 DIAGNOSIS — S0190XA Unspecified open wound of unspecified part of head, initial encounter: Secondary | ICD-10-CM | POA: Diagnosis not present

## 2014-10-10 DIAGNOSIS — R55 Syncope and collapse: Secondary | ICD-10-CM | POA: Diagnosis not present

## 2014-10-10 DIAGNOSIS — Z794 Long term (current) use of insulin: Secondary | ICD-10-CM | POA: Diagnosis not present

## 2014-10-10 DIAGNOSIS — E119 Type 2 diabetes mellitus without complications: Secondary | ICD-10-CM | POA: Diagnosis not present

## 2014-10-10 DIAGNOSIS — Z79899 Other long term (current) drug therapy: Secondary | ICD-10-CM | POA: Diagnosis not present

## 2014-10-10 DIAGNOSIS — R42 Dizziness and giddiness: Secondary | ICD-10-CM | POA: Diagnosis not present

## 2014-10-10 DIAGNOSIS — I517 Cardiomegaly: Secondary | ICD-10-CM | POA: Diagnosis not present

## 2014-10-10 DIAGNOSIS — N184 Chronic kidney disease, stage 4 (severe): Secondary | ICD-10-CM | POA: Diagnosis not present

## 2014-10-10 LAB — COMPREHENSIVE METABOLIC PANEL
ANION GAP: 8 (ref 7–16)
Albumin: 3.9 g/dL
Alkaline Phosphatase: 78 U/L
BUN: 49 mg/dL — ABNORMAL HIGH
Bilirubin,Total: 0.4 mg/dL
CALCIUM: 8.6 mg/dL — AB
CHLORIDE: 108 mmol/L
Co2: 20 mmol/L — ABNORMAL LOW
Creatinine: 2.63 mg/dL — ABNORMAL HIGH
GFR CALC AF AMER: 24 — AB
GFR CALC NON AF AMER: 21 — AB
Glucose: 284 mg/dL — ABNORMAL HIGH
POTASSIUM: 5.3 mmol/L — AB
SGOT(AST): 23 U/L
SGPT (ALT): 19 U/L
Sodium: 136 mmol/L
TOTAL PROTEIN: 6.8 g/dL

## 2014-10-10 LAB — CBC
HCT: 41.5 % (ref 40.0–52.0)
HGB: 13.5 g/dL (ref 13.0–18.0)
MCH: 28.5 pg (ref 26.0–34.0)
MCHC: 32.5 g/dL (ref 32.0–36.0)
MCV: 88 fL (ref 80–100)
PLATELETS: 151 10*3/uL (ref 150–440)
RBC: 4.74 10*6/uL (ref 4.40–5.90)
RDW: 14.5 % (ref 11.5–14.5)
WBC: 7.5 10*3/uL (ref 3.8–10.6)

## 2014-10-10 LAB — URINALYSIS, COMPLETE
Bacteria: NONE SEEN
Bilirubin,UR: NEGATIVE
Blood: NEGATIVE
Glucose,UR: >=500 mg/dL (ref 0–75)
KETONE: NEGATIVE
Nitrite: NEGATIVE
PH: 5 (ref 4.5–8.0)
SPECIFIC GRAVITY: 1.017 (ref 1.003–1.030)
Squamous Epithelial: NONE SEEN

## 2014-10-10 LAB — TROPONIN I: Troponin-I: <0.03 ng/mL

## 2014-10-11 ENCOUNTER — Encounter: Payer: Self-pay | Admitting: Cardiology

## 2014-10-13 ENCOUNTER — Ambulatory Visit (INDEPENDENT_AMBULATORY_CARE_PROVIDER_SITE_OTHER): Payer: Medicare Other | Admitting: Cardiology

## 2014-10-13 ENCOUNTER — Encounter: Payer: Self-pay | Admitting: Cardiology

## 2014-10-13 ENCOUNTER — Encounter: Payer: Self-pay | Admitting: Internal Medicine

## 2014-10-13 VITALS — BP 130/60 | HR 83 | Ht 73.0 in | Wt 216.3 lb

## 2014-10-13 DIAGNOSIS — R55 Syncope and collapse: Secondary | ICD-10-CM | POA: Diagnosis not present

## 2014-10-13 NOTE — Patient Instructions (Addendum)
Your physician recommends that you schedule a follow-up appointment in: 2 months  We are referring you to [facility] for syncope [id number], [place]

## 2014-10-13 NOTE — Progress Notes (Signed)
HPI Pt with known hx of CM who presents following a syncopal episode.  He was sitting [employer] yesterday when he had a frank syncopal episode. He said he been feeling well. He walks the length of the store. He sat down and he did not have any prodrome. He then had a frank syncopal episode injuring his eye. He presented to the emergency room at [facility] via EMS. His biventricular pacemaker was interrogated and was normal. I don't have all of the results but he said his head CT was normal. I do have labs. He has chronic renal insufficiency which was stable. There was no clear etiology identified. He otherwise has felt well. He's not had any otherwise presyncope or syncope. He doesn't notice any palpitations. He occasionally has diaphragmatic stimulation from his device depending on how he is lying. He gets around with a walker. He has chronic weakness but has not had any new cardiovascular complaints. He did have blood draw on the day that it happened. This was not however fasting.   Allergies  Allergen Reactions  . Celecoxib     REACTION: myalgia: abdominal pain  . Nsaids     REACTION: renal disease  . Rosuvastatin     REACTION: myalgia: musle pain    Current Outpatient Prescriptions  Medication Sig Dispense Refill  . B-D ULTRAFINE III SHORT PEN 31G X 8 MM MISC USE AS DIRECTED 100 each 3  . carvedilol (COREG) 12.5 MG tablet TAKE 1 TABLET BY MOUTH TWICE A DAY 180 tablet 0  . cholecalciferol (VITAMIN D) 1000 UNITS tablet Take 2,000 Units by mouth daily.    [name] glucose blood test strip 1 each by Other route 2 (two) times daily as needed for other. (Patient taking differently: 1 each by Other route daily. ) 200 each 3  . Insulin Glargine (LANTUS) 100 UNIT/ML Solostar Pen Inject 28 Units into the skin daily.    [name] lisinopril (PRINIVIL,ZESTRIL) 2.5 MG tablet TAKE 1 TABLET BY MOUTH DAILY 30 tablet 5  . nitroGLYCERIN (NITROSTAT) 0.4 MG SL tablet Place 1 tablet (0.4 mg total) under the tongue every 5  (five) minutes as needed. (Patient taking differently: Place 0.4 mg under the tongue every 5 (five) minutes as needed for chest pain (MAX 3 TABLETS). ) 25 tablet 6  . Nutritional Supplements (GLUCERNA SHAKE PO) Take 4 oz by mouth daily after breakfast.      . pravastatin (PRAVACHOL) 40 MG tablet TAKE 1 TABLET BY MOUTH AT BEDTIME 30 tablet 8  . testosterone cypionate (DEPOTESTOTERONE CYPIONATE) 200 MG/ML injection INJECT 1.25ML INTO THE MUSCLE EVERY 28 DAYS 10 mL 0   No current facility-administered medications for this visit.   PHYSICAL EXAM BP 130/60 mmHg  Pulse 83  Ht 6\' 1"  (1.854 m)  Wt 216 lb 4.8 oz (98.113 kg)  BMI 28.54 kg/m2 GENERAL:  Well appearing HEENT:  Bruising in his right eye, otherwise unremarkable NECK:  Flat JVD, no bruits. no thyromegaly LUNGS:  Clear to auscultation bilaterally BACK:  No CVA tenderness CHEST:  Well healed sternotomy scar, ICD pocket intact HEART:  RRR, nml S1/2 no S3, no S4, no clicks, no rubs, no murmurs ABD:  Flat, positive bowel sounds normal in frequency in pitch, no bruits, no rebound, no guarding, no midline pulsatile mass, no hepatomegaly, no splenomegaly, small umbilical hernia EXT:  1+ DPs, trace edema bilaterally, no cyanosis no clubbing NEURO:  Nonfocal.   EKG:  NSR. BiV paced with fusion beats.  10/13/2014  ASSESSMENT AND  PLAN  SYNCOPE -  I will send the pacer interrogated to Dr. Lovena Le.  For his review. This certainly sounds like it could have been a cardiogenic event but his device seems to be functionally normally.  He was orthostatic but is not having symptoms related to this. He has been told not to drive. I will refer him for neurology evaluation.  CAD (coronary artery disease) - The patient has no new sypmtoms.  No further cardiovascular testing is indicated.  We will continue with aggressive risk reduction and meds as listed.  CHRONIC SYSTOLIC HEART FAILURE -   He seems to be euvolemic. No change in therapy is indicated.  HTN -  The blood pressure is at target. No change in medications is indicated. We will continue with therapeutic lifestyle changes (TLC).  Dizziness-  Carotid ultrasound was recently normal. He could have possible vertebrobasilar insufficiency.

## 2014-10-13 NOTE — Telephone Encounter (Signed)
Pt's daughter [name] is calling to follow up on the My chart note that was placed. She states that he is still having some dizzy spells and would like to see if the pt could be seen today or tomorrow. Please follow up  Sandra's cell: [phone] Pt's home: [phone]  Thanks

## 2014-10-13 NOTE — Telephone Encounter (Signed)
Returned call to patient's daughter Katharine Look.She stated father passed out this past Monday 10/10/14 while at [facility].Stated EMS transported him to [place] [facility].Stated he was told he needed to see cardiologist.Stated he had a dizzy spell yesterday almost blacked out again.Stated she already called back and he has appointment this afternoon with Dr.Hochrein.

## 2014-10-14 ENCOUNTER — Ambulatory Visit: Payer: Medicare Other | Admitting: Family Medicine

## 2014-10-14 ENCOUNTER — Encounter: Payer: Self-pay | Admitting: Family Medicine

## 2014-10-14 ENCOUNTER — Ambulatory Visit (INDEPENDENT_AMBULATORY_CARE_PROVIDER_SITE_OTHER): Payer: Medicare Other | Admitting: Family Medicine

## 2014-10-14 VITALS — BP 156/76 | HR 64 | Temp 97.8°F | Wt 219.5 lb

## 2014-10-14 DIAGNOSIS — R55 Syncope and collapse: Secondary | ICD-10-CM

## 2014-10-14 DIAGNOSIS — I10 Essential (primary) hypertension: Secondary | ICD-10-CM | POA: Diagnosis not present

## 2014-10-14 LAB — BASIC METABOLIC PANEL
BUN: 56 mg/dL — ABNORMAL HIGH (ref 6–23)
CO2: 14 mEq/L — ABNORMAL LOW (ref 19–32)
CREATININE: 2.55 mg/dL — AB (ref 0.50–1.35)
Calcium: 8.8 mg/dL (ref 8.4–10.5)
Chloride: 105 mEq/L (ref 96–112)
Glucose, Bld: 224 mg/dL — ABNORMAL HIGH (ref 70–99)
Potassium: 5.4 mEq/L — ABNORMAL HIGH (ref 3.5–5.3)
SODIUM: 133 meq/L — AB (ref 135–145)

## 2014-10-14 MED ORDER — CARVEDILOL 12.5 MG PO TABS: 6.2500 mg | ORAL_TABLET | Freq: Two times a day (BID) | ORAL | Status: DC

## 2014-10-14 NOTE — Progress Notes (Signed)
Pre visit review using our clinic review tool, if applicable. No additional management support is needed unless otherwise documented below in the visit note.  Per patient, has been episodically lightheaded w/o vertigo over the last few weeks.  He had an episode at [facility], was lightheaded and then woke up on the ground after apparent syncope.  He hit R shoulder and R side of face on the ground.  To ER with eval noted, labs and EKG reviewed.  Has seen cards in the meantime with neuro eval pending.  No other syncope.  No CP, not SOB.  Cr elevated along with K and due for recheck labs.  He has noted R sided HA w/o focal neuro changes.  No photo/phonophobia.    Meds, vitals, and allergies reviewed.   ROS: See HPI.  Otherwise, noncontributory.  nad brusing noted on R orbit EOMI Mmm rrr ctab Abd soft Ext without edema

## 2014-10-14 NOTE — Assessment & Plan Note (Signed)
He has f/u with neuro pending.  Repeat bmet today pending.  D/w pt - he now describes lightheaded/orthostatic sx.  Will dec BB in meantime and we'll get update from patient about his sx when labs resulted.  He agrees.   Continue other meds as is.  Unclear if he had a concussion- he didn't have LOC from the fall, but had LOC leading to the fall.  We should treat him as if he did have a concussion, ie rest over the weekend.  D/w pt, he agrees.   >25 minutes spent in face to face time with patient, >50% spent in counselling or coordination of care.

## 2014-10-14 NOTE — Patient Instructions (Signed)
Cut your carvedilol in half.  Take a half a pill twice a day.  Go to the lab on the way out.  We'll contact you with your lab report. We'll go from there.  Take care.

## 2014-10-17 ENCOUNTER — Telehealth: Payer: Self-pay | Admitting: *Deleted

## 2014-10-17 ENCOUNTER — Telehealth: Payer: Self-pay | Admitting: Family Medicine

## 2014-10-17 NOTE — Telephone Encounter (Signed)
Patient advised of lab results.  Routing issues with EPIC.

## 2014-10-17 NOTE — Telephone Encounter (Signed)
Patient was here today with his wife.  Feels better, not lightheaded on lower dose of coreg.  Recheck BP 140/80 at OV.   He'll update me with BP and how he feels later on, in a few days, if needed. Continue as is for now.

## 2014-10-18 ENCOUNTER — Ambulatory Visit: Payer: Medicare Other | Admitting: Cardiology

## 2014-10-20 DIAGNOSIS — N184 Chronic kidney disease, stage 4 (severe): Secondary | ICD-10-CM | POA: Diagnosis not present

## 2014-10-20 DIAGNOSIS — D631 Anemia in chronic kidney disease: Secondary | ICD-10-CM | POA: Diagnosis not present

## 2014-10-20 DIAGNOSIS — N2581 Secondary hyperparathyroidism of renal origin: Secondary | ICD-10-CM | POA: Diagnosis not present

## 2014-10-20 DIAGNOSIS — I10 Essential (primary) hypertension: Secondary | ICD-10-CM | POA: Diagnosis not present

## 2014-10-21 ENCOUNTER — Encounter: Payer: Self-pay | Admitting: Family Medicine

## 2014-10-22 ENCOUNTER — Other Ambulatory Visit: Payer: Self-pay | Admitting: Cardiology

## 2014-10-24 NOTE — Telephone Encounter (Signed)
Dose was reduced be pcp. Please advise on refill. Thanks, MI

## 2014-10-26 ENCOUNTER — Other Ambulatory Visit: Payer: Self-pay | Admitting: *Deleted

## 2014-10-26 ENCOUNTER — Telehealth: Payer: Self-pay | Admitting: Cardiology

## 2014-10-26 MED ORDER — CARVEDILOL 12.5 MG PO TABS: 6.2500 mg | ORAL_TABLET | Freq: Two times a day (BID) | ORAL | Status: DC

## 2014-10-26 NOTE — Telephone Encounter (Signed)
Left message for pharmacy w/ dosage information, prescriber & NPI, pt name, DOB, phone #.

## 2014-10-26 NOTE — Telephone Encounter (Signed)
[facility] is calling to get Clarification on [name] . Please call   Thanks

## 2014-10-26 NOTE — Telephone Encounter (Signed)
Line busy. Checked med history - carvedilol dose should be 6.25mg  BID.

## 2014-10-26 NOTE — Telephone Encounter (Signed)
Pt. Stated he was taking 6.25 mg BID

## 2014-10-26 NOTE — Telephone Encounter (Signed)
I called the pt. And he stated he was taking Coreg 6.25 mg BID

## 2014-10-27 ENCOUNTER — Telehealth: Payer: Self-pay

## 2014-10-27 NOTE — Telephone Encounter (Signed)
Mickel Baas Cramm case mgr with [employer] left v/m requesting last BP and A1c results. I left v/m requesting latest BP and A1C requested in writing with written release from pt.

## 2014-11-03 ENCOUNTER — Ambulatory Visit (INDEPENDENT_AMBULATORY_CARE_PROVIDER_SITE_OTHER): Payer: Medicare Other | Admitting: Family Medicine

## 2014-11-03 ENCOUNTER — Encounter: Payer: Self-pay | Admitting: Family Medicine

## 2014-11-03 VITALS — BP 124/60 | HR 81 | Temp 97.6°F | Wt 217.8 lb

## 2014-11-03 DIAGNOSIS — J069 Acute upper respiratory infection, unspecified: Secondary | ICD-10-CM | POA: Diagnosis not present

## 2014-11-03 MED ORDER — BENZONATATE 200 MG PO CAPS: 200.0000 mg | ORAL_CAPSULE | Freq: Three times a day (TID) | ORAL | Status: DC | PRN

## 2014-11-03 MED ORDER — LORATADINE 10 MG PO TABS: 10.0000 mg | ORAL_TABLET | Freq: Every day | ORAL | Status: DC

## 2014-11-03 NOTE — Progress Notes (Signed)
Pre visit review using our clinic review tool, if applicable. No additional management support is needed unless otherwise documented below in the visit note.  Sick for about 5 days.  Cough and ST, more cough laying down.  Some rhinorrhea.  No fevers.  No sputum usually, occ clear sputum.  No vomiting, no diarrhea.  Cough comes in fits.  Throat is still sore.  No rash.  Sick contact at home.  Post nasal gtt noted.  Advised tylenol if needed, not nsaids.    Fell prev.  R shoulder still sore.  Bruising prev resolved.  More shoulder pain at night.  Heat helps.  No pain on ROM usually, but that can get better.  Has noted muscle spasms, better with massage.  Some nights are better or worse than others.  Using an OTC liniment.   He isn't lightheaded and has had no more events since dec in BP medicine.   Meds, vitals, and allergies reviewed.   ROS: See HPI.  Otherwise, noncontributory.  GEN: nad, alert and oriented HEENT: mucous membranes moist, tm w/o erythema, nasal exam w/o erythema, clear discharge noted,  OP with cobblestoning, sinuses not ttp NECK: supple w/o LA CV: rrr.   PULM: ctab, no inc wob EXT: no edema SKIN: no acute rash R upper trap slightly ttp but no pain on shoulder ROM

## 2014-11-03 NOTE — Patient Instructions (Signed)
Try taking claritin for the runny nose and tessalon for the cough.  Both should help.   Drink plenty of fluids, take tylenol as needed, and gargle with warm salt water for your throat.  This should gradually improve.  Take care.  Let us know if you have other concerns.   Don't take aleve.  Keep using the liniment and the massage for your shoulder.

## 2014-11-04 ENCOUNTER — Telehealth: Payer: Self-pay | Admitting: Family Medicine

## 2014-11-04 ENCOUNTER — Telehealth: Payer: Self-pay

## 2014-11-04 DIAGNOSIS — J069 Acute upper respiratory infection, unspecified: Secondary | ICD-10-CM | POA: Insufficient documentation

## 2014-11-04 NOTE — Telephone Encounter (Signed)
PLEASE NOTE: All timestamps contained within this report are represented as [place] Standard Time. CONFIDENTIALTY NOTICE: This fax transmission is intended only for the addressee. It contains information that is legally privileged, confidential or otherwise protected from use or disclosure. If you are not the intended recipient, you are strictly prohibited from reviewing, disclosing, copying using or disseminating any of this information or taking any action in reliance on or regarding this information. If you have received this fax in error, please notify us immediately by telephone so that we can arrange for its return to [place]. Phone: [phone], Toll-Free: [phone], Fax: [phone] Page: 1 of 2 Call Id: [id number] [place] Patient Name: [name] Gender: Male DOB: 14-Apr-1926 Age: 81 Y 10 M 25 D Return Phone Number: [phone] (Secondary) Address: [address] City/State/Zip: [place] [address] Client [facility] - Client Client Site [facility] - Day Physician Renford Dills Contact Type Call Call Type Triage / Clinical Caller Name Shelva Majestic Relationship To Patient Spouse Appointment Disposition EMR Patient Refused Appointment Info pasted into Epic Yes Return Phone Number [phone] (Secondary) Chief Complaint Dizziness Initial Comment Caller states husband's meds making him dizzy PreDisposition Call Doctor Nurse Assessment Nurse: Marcelline Deist, RN, Kermit Balo Date/Time Eilene Ghazi Time): 11/04/2014 12:53:12 PM Confirm and document reason for call. If symptomatic, describe symptoms. ---Caller states he thinks his meds. are making him dizzy - Benzonatate. He doesn't have that now, but would rather not try rx since the info. included said it could cause this. He passed out 3 weeks ago from medication. They cut back on his  heart medication. Has had a cold/flu since Sat. Has had frequent watery diarrhea today, about 7 episodes. Has a slight headache, cough & slt. chest discomfort from hard coughing. Seen yesterday. Has the patient traveled out of the country within the last 30 days? ---Not Applicable Does the patient require triage? ---Yes Related visit to physician within the last 2 weeks? ---Yes Does the PT have any chronic conditions? (i.e. diabetes, asthma, etc.) ---Yes List chronic conditions. ---heart issue, bypass hx, defibrillator, diabetes on insulin Guidelines Guideline Title Affirmed Question Affirmed Notes Nurse Date/Time (Eastern Time) Diarrhea [1] Age > 60 years AND [2] > 6 diarrhea stools in past 24 hours Marcelline Deist, Therapist, sports, Kermit Balo 11/04/2014 1:01:36 PM Disp. Time Eilene Ghazi Time) Disposition Final User 11/04/2014 1:07:54 PM See Physician within 4 Hours (or PCP triage) Yes Marcelline Deist, RN, Kermit Balo PLEASE NOTE: All timestamps contained within this report are represented as [place] Standard Time. CONFIDENTIALTY NOTICE: This fax transmission is intended only for the addressee. It contains information that is legally privileged, confidential or otherwise protected from use or disclosure. If you are not the intended recipient, you are strictly prohibited from reviewing, disclosing, copying using or disseminating any of this information or taking any action in reliance on or regarding this information. If you have received this fax in error, please notify us immediately by telephone so that we can arrange for its return to [place]. Phone: [phone], Toll-Free: [phone], Fax: [phone] Page: 2 of 2 Call Id: [id number] Caller Understands: Yes Disagree/Comply: Disagree Disagree/Comply Reason: Disagree with instructions Care Advice Given Per Guideline CARE ADVICE given per Diarrhea (Adult) guideline. CALL BACK IF: * You become worse. SEE PHYSICIAN WITHIN 4 HOURS (or PCP triage): * IF NO PCP TRIAGE: You need  to be seen. Go to _______________ (ED/[facility] or office if it will be open) within  the next 3 or 4 hours. Go sooner if you become worse. CLEAR FLUIDS: * Drink more fluids. * Sip water or a sports - rehydration drink (Gatorade or Powerade) After Care Instructions Given Call Event Type User Date / Time Description Comments User: Donald Siva, RN Date/Time (Eastern Time): 11/04/2014 1:13:01 PM Caller does not want to come in for an appt. today. He feels better than he has, would like this to run its course. Will make office aware that patient does not want to come in as recommended & also noting that patient does not want to take the cough rx ordered as he is concerned it may cause him to be dizzy since the info. attached listed that as a side effect. Referrals GO TO FACILITY UNDECIDED

## 2014-11-04 NOTE — Telephone Encounter (Signed)
Pts wife is aware 

## 2014-11-04 NOTE — Telephone Encounter (Signed)
This is isn't a miscommunication.  I realized he hasn't taken the meds.  I think he can tolerate both.   If he doesn't want to take them, he doesn't have to.  I'll defer to him.

## 2014-11-04 NOTE — Telephone Encounter (Signed)
PLEASE NOTE: All timestamps contained within this report are represented as [place] Standard Time. CONFIDENTIALTY NOTICE: This fax transmission is intended only for the addressee. It contains information that is legally privileged, confidential or otherwise protected from use or disclosure. If you are not the intended recipient, you are strictly prohibited from reviewing, disclosing, copying using or disseminating any of this information or taking any action in reliance on or regarding this information. If you have received this fax in error, please notify us immediately by telephone so that we can arrange for its return to [place]. Phone: [phone], Toll-Free: [phone], Fax: [phone] Page: 1 of 1 Call Id: [id number] [place] Patient Name: [name] DOB: Sep 28, 1925 Initial Comment Caller states husband's meds making him dizzy Nurse Assessment Nurse: Marcelline Deist, RN, Kermit Balo Date/Time (Eastern Time): 11/04/2014 12:53:12 PM Confirm and document reason for call. If symptomatic, describe symptoms. ---Caller states he thinks his meds. are making him dizzy - Benzonatate. He doesn't have that now, but would rather not try rx since the info. included said it could cause this. He passed out 3 weeks ago from medication. They cut back on his heart medication. Has had a cold/flu since Sat. Has had frequent watery diarrhea today, about 7 episodes. Has a slight headache, cough & slt. chest discomfort from hard coughing. Seen yesterday. Has the patient traveled out of the country within the last 30 days? ---Not Applicable Does the patient require triage? ---Yes Related visit to physician within the last 2 weeks? ---Yes Does the PT have any chronic conditions? (i.e. diabetes, asthma, etc.) ---Yes List chronic conditions. ---heart issue, bypass hx, defibrillator, diabetes on insulin Guidelines Guideline  Title Affirmed Question Affirmed Notes Diarrhea [1] Age > 60 years AND [2] > 6 diarrhea stools in past 24 hours Final Disposition User See Physician within 4 Hours (or PCP triage) Marcelline Deist, RN, Lynda Comments Caller does not want to come in for an appt. today. He feels better than he has, would like this to run its course. Will make office aware that patient does not want to come in as recommended & also noting that patient does not want to take the cough rx ordered as he is concerned it may cause him to be dizzy since the info. attached listed that as a side effect.

## 2014-11-04 NOTE — Assessment & Plan Note (Signed)
Likely viral, nontoxic, f/u prn.  See AVS.  Avoid nsaids.  The shoulder pain is likely msk strain and should resolve. Likely unrelated.

## 2014-11-04 NOTE — Telephone Encounter (Signed)
Mrs Barrell left v/m that pt was seen on 11/03/14 and has not started taking med given at that appt because possible side effects of med are h/a and dizziness; pt already has h/a and dizziness; this morning pt has diarrhea and cough. Mrs Toruno request cb with what to do.

## 2014-11-04 NOTE — Telephone Encounter (Signed)
Spoke to pt's wife and i think there was a miscommunication. [name] states that pt has not taken Tessalon or imodium--he has not started because he saw that 1 side effect was dizziness. Pt is feeling a little better and last episode of diarrhea was this morning

## 2014-11-04 NOTE — Telephone Encounter (Signed)
If some better and if he doesn't want to take tessalon, then continue off the med. Thanks.  Can still take imodium for diarrhea.   Thanks.

## 2014-11-04 NOTE — Telephone Encounter (Signed)
Please refer to other telephone note 

## 2014-11-04 NOTE — Telephone Encounter (Signed)
Imodium BID prn diarrhea.  I would still try tessalon for the cough.  It is usually tolerated well.  O/w can try sugar free cough drops.

## 2014-11-08 ENCOUNTER — Encounter: Payer: Self-pay | Admitting: Family Medicine

## 2014-11-08 ENCOUNTER — Ambulatory Visit (INDEPENDENT_AMBULATORY_CARE_PROVIDER_SITE_OTHER): Payer: Medicare Other | Admitting: Family Medicine

## 2014-11-08 VITALS — BP 100/60 | HR 72 | Temp 97.6°F | Wt 213.2 lb

## 2014-11-08 DIAGNOSIS — R05 Cough: Secondary | ICD-10-CM | POA: Diagnosis not present

## 2014-11-08 DIAGNOSIS — R059 Cough, unspecified: Secondary | ICD-10-CM

## 2014-11-08 MED ORDER — DOXYCYCLINE HYCLATE 100 MG PO TABS: 100.0000 mg | ORAL_TABLET | Freq: Two times a day (BID) | ORAL | Status: DC

## 2014-11-08 NOTE — Progress Notes (Signed)
Pre visit review using our clinic review tool, if applicable. No additional management support is needed unless otherwise documented below in the visit note.  Cough and aches worse at night. No fevers.  Some phlegm now, yellowish.  Not bloody. Not improved from prev.  Waking at night with cough.  Fatigued, from sleep disruption.  He had concerns about possible side effects from tessalon.  He hasn't taken it yet.  We talked about it likely being the safest cough medicine option.  Recently with some occ BLE edema and some occ diarrhea.    Meds, vitals, and allergies reviewed.   ROS: See HPI.  Otherwise, noncontributory.  GEN: nad, alert and oriented HEENT: mucous membranes moist, tm w/o erythema, nasal exam w/o erythema, clear discharge noted,  OP with cobblestoning NECK: supple w/o LA CV: rrr.   PULM: ctab, no inc wob, no focal dec in BS EXT: no edema (improved from prev per patient) SKIN: no acute rash

## 2014-11-08 NOTE — Patient Instructions (Signed)
Tessalon is likely the safest option for your cough.  Swallow it whole.   Start doxycycline in the meantime.  Try to get some rest.  Take care.

## 2014-11-09 DIAGNOSIS — R05 Cough: Secondary | ICD-10-CM | POA: Insufficient documentation

## 2014-11-09 DIAGNOSIS — R059 Cough, unspecified: Secondary | ICD-10-CM | POA: Insufficient documentation

## 2014-11-09 NOTE — Assessment & Plan Note (Signed)
Given his age and sx duration, along with comorbid conditions, would cover for atypical bacterial infection.  Start doxy.  D/w pt about tessalon, likely the safest cough suppressant he could use.  Not lightheaded now on lower dose of BB.  Still okay for outpatient f/u.  Update me as needed.

## 2014-11-16 DIAGNOSIS — N184 Chronic kidney disease, stage 4 (severe): Secondary | ICD-10-CM | POA: Diagnosis not present

## 2014-11-21 ENCOUNTER — Encounter: Payer: Self-pay | Admitting: Internal Medicine

## 2014-12-07 ENCOUNTER — Ambulatory Visit (INDEPENDENT_AMBULATORY_CARE_PROVIDER_SITE_OTHER): Payer: Medicare Other | Admitting: Neurology

## 2014-12-07 ENCOUNTER — Encounter: Payer: Self-pay | Admitting: Neurology

## 2014-12-07 VITALS — BP 116/74 | HR 56 | Resp 18 | Ht 73.0 in | Wt 214.0 lb

## 2014-12-07 DIAGNOSIS — R55 Syncope and collapse: Secondary | ICD-10-CM | POA: Insufficient documentation

## 2014-12-07 NOTE — Patient Instructions (Signed)
1. Continue control of cholesterol, BP 2. Recent cholesterol bloodwork will be requested for review from your PCP 3. Recommend starting daily aspirin 81mg , after clearing with Dr. Carlean Purl 4. As per [place] driving laws, after an episode of loss of consciousness, one should not drive until 6 months event-free 5. Call our office for any change in symptoms

## 2014-12-07 NOTE — Progress Notes (Signed)
NEUROLOGY CONSULTATION NOTE  [name] MRN: [id number] DOB: 07-02-1925  Referring provider: Dr. Minus Breeding Primary care provider: Dr. Elsie Stain  Reason for consult:  syncope  Dear Dr Percival Spanish:  Thank you for your kind referral of [name] for consultation of the above symptoms. Although his history is well known to you, please allow me to reiterate it for the purpose of our medical record. The patient was accompanied to the clinic by his wife who also provides collateral information. Records and images were personally reviewed where available.  HISTORY OF PRESENT ILLNESS: This is a very pleasant 81 year old right-handed man with a history of hypertension, hyperlipidemia, diabetes, ischemic cardiomyopathy s/p ICD insertion, chronic renal insufficiency, diverticulitis, presenting after an episode of loss of consciousness on 10/10/2014. He has a history of intermittent brief episodes of dizziness lasting 10-15 seconds described as a lightheaded sensation occurring around 1-2 times a month. On 10/10/14, he was at [facility] and recalls standing by the cash register then started having the same dizzy sensation but lasting longer ("about a minute"), with blurred vision. The next thing he knew, he was on the floor, no confusion, focal weakness, tongue bite or urinary incontinence. He was brought to [facility] where CMP showed a glucose of 284, potassium 5.3, creatining 2.63, BUN 49. He had a head CT without contrast, images unavailable for review, per report brain is atrophic with chronic microvascular ischemic change, remote right occipital infarct seen, no acute changes noted. His Coreg dose was cut in half, and he reports that since then, the dizzy spells have stopped. He hit his right shoulder and the right side of his face with the fall, and has headaches on the right side 1-2 times a week, no associated nausea, vomiting, photo/phonophobia.  He continues to have right shoulder pain around  the shoulder blade, no weakness. Applying a liniment and taking aspirin 325mg  prn seems to help. Shoulder pain is worse at night, sleeping on 2 pillows helps. He has right knee pain and numbness in both feet from diabetes. He denies any diplopia, dysarthria, dysphagia, back pain, bowel/bladder dysfunction. He denies any prior history of stroke. He and his wife deny any gaps in time, staring/unresponsive episodes, no  olfactory/gustatory hallucinations, deja vu, rising epigastric sensation, focal numbness/tingling/weakness, myoclonic jerks. He had a normal birth and early development.  There is no history of febrile convulsions, CNS infections such as meningitis/encephalitis, significant traumatic brain injury, neurosurgical procedures, or family history of seizures.  Carotid dopplers 03/2014: bilateral ICAs showed mild homogeneous plaque with no evidence of hemodynamically significant stenosis. Right vertebral artery demonstrated abnormal antegrade flow. Left vertebral artery demonstrated normal antegrade flow  Laboratory Data:  Lab Results  Component Value Date   WBC 7.5 10/10/2014   HGB 13.5 10/10/2014   HCT 41.5 10/10/2014   MCV 88 10/10/2014   PLT 151 10/10/2014     Chemistry      Component Value Date/Time   NA 133* 10/14/2014 1702   NA 136 10/10/2014 1145   NA 139 10/11/2013   K 5.4* 10/14/2014 1702   K 5.3* 10/10/2014 1145   CL 105 10/14/2014 1702   CL 108 10/10/2014 1145   CO2 14* 10/14/2014 1702   CO2 20* 10/10/2014 1145   BUN 56* 10/14/2014 1702   BUN 49* 10/10/2014 1145   BUN 37* 10/11/2013   CREATININE 2.55* 10/14/2014 1702   CREATININE 2.63* 10/10/2014 1145   CREATININE 3.07* 08/22/2014 1002      Component Value Date/Time  CALCIUM 8.8 10/14/2014 1702   CALCIUM 8.6* 10/10/2014 1145   ALKPHOS 78 10/10/2014 1145   ALKPHOS 72 08/09/2014 0324   AST 23 10/10/2014 1145   AST 23 08/09/2014 0324   ALT 19 10/10/2014 1145   ALT 20 08/09/2014 0324   BILITOT 0.7 08/09/2014  0324     Lab Results  Component Value Date   CHOL 158 11/30/2012   HDL 38.40* 11/30/2012   LDLCALC 66 10/02/2010   LDLDIRECT 74.1 06/15/2014   TRIG 247.0* 11/30/2012   CHOLHDL 4 11/30/2012    PAST MEDICAL HISTORY: Past Medical History  Diagnosis Date  . HTN (hypertension) (06/17/1981)  . Diverticula of colon (06/18/1983)  . Renal insufficiency   . Hyperlipidemia 06/17/1993)  . Diabetes mellitus  (06/17/1997)  . PUD (peptic ulcer disease) 1960's  . Pancreatitis 01/24 - 07/13/08    ABD CT  fatty liver early pancreatitis  07/12/2008  Pelvic CT Nml 07/12/2008  . Cardiomyopathy     EF 25% Ant Akinesis Global Hypo 3/2-08/18/08  . Thyroid nodule     per Dr. Brantley Stage  . Renal disease     per Dr. Posey Pronto  . Hypogonadism male   . GIB (gastrointestinal bleeding)     admission 07/2014, likely diverticular bleeding  . Arthritis   . Lower GI bleed 08/08/2014    PAST SURGICAL HISTORY: Past Surgical History  Procedure Laterality Date  . Colonoscopy  12/2007    4 mm sigmoid HP polyp, diverticulosis. random biopsies negative for colitis.   . Cardiac catheterization       Severe 3-vessel coronary artery disease.  Ischemic   cardiomyopathy.   . Nephrectomy  01/09/09    Partial left nephrectomy. Clear Cell Renal Cell Carcinoma 01/09/2009  . Cardiac defibrillator placement      ICD-Medtronic .  [id number]  . Laparotomy       small mid 80's (laparotomy), 09/12/2000 (no surgery), 10/20/2008 (no surgery)  . Lesion removal      RLE 11/14/2006 (Dr Hulen Skains)   . Ct of abdomen  06/1997    Fatty process unchanged - myelolipoma adrenal unchangd  . Ct of abdomen  02/99    Adrenal mass unchanged, no further follow up required  . Sbo  08/2000 no surgery   10/20/08 no surgery    Mid 80's laparotomy  . Esophagogastroduodenoscopy  07/12/2008    Schatzki Ring 2 cm. HH (Dr. Deatra Ina)  . Abdominal US  07/12/08    Fatty infiltrate liver complex cyst in lower pole left kidney  . Icd/ crt (lv lead disabled)       Medtronic [id number]  . Coronary artery bypass graft  09/05/08     Mediastinotomy, extracorporeal circulation,   coronary artery bypass graft surgery x4 using a left internal mammary  artery graft to the left anterior descending coronary artery, with a  saphenous vein graft to the first diagonal branch of the LAD, a   saphenous vein graft to the obtuse marginal branch of the left   circumflex coronary artery,  Dr. Cyndia Bent  . Cholecystectomy  1970's  . Esophagogastroduodenoscopy N/A 08/09/2014    Procedure: ESOPHAGOGASTRODUODENOSCOPY (EGD);  Surgeon: Ladene Artist, MD;  Location: [facility] ENDOSCOPY;  Service: Endoscopy;  Laterality: N/A;  . Colonoscopy with propofol N/A 08/10/2014    Procedure: COLONOSCOPY WITH PROPOFOL;  Surgeon: Ladene Artist, MD;  Location[facility] ENDOSCOPY;  Service: Endoscopy;  Laterality: N/A;    MEDICATIONS: Current Outpatient Prescriptions on File Prior to Visit  Medication Sig Dispense Refill  .  carvedilol (COREG) 6.25 MG tablet Take by mouth. Take 1/2 tablet twice a day    . Insulin Glargine (LANTUS) 100 UNIT/ML Solostar Pen Inject 28 Units into the skin daily.    [name] lisinopril (PRINIVIL,ZESTRIL) 2.5 MG tablet TAKE 1 TABLET BY MOUTH DAILY 30 tablet 5  . Nutritional Supplements (GLUCERNA SHAKE PO) Take 4 oz by mouth daily after breakfast.      . pravastatin (PRAVACHOL) 40 MG tablet TAKE 1 TABLET BY MOUTH AT BEDTIME 30 tablet 8  . testosterone cypionate (DEPOTESTOTERONE CYPIONATE) 200 MG/ML injection INJECT 1.25ML INTO THE MUSCLE EVERY 28 DAYS 10 mL 0  . nitroGLYCERIN (NITROSTAT) 0.4 MG SL tablet Place 1 tablet (0.4 mg total) under the tongue every 5 (five) minutes as needed. (Patient not taking: Reported on 12/07/2014) 25 tablet 6   No current facility-administered medications on file prior to visit.    ALLERGIES: Allergies  Allergen Reactions  . Celecoxib     REACTION: myalgia: abdominal pain  . Nsaids     REACTION: renal disease  . Rosuvastatin     REACTION: myalgia:  musle pain    FAMILY HISTORY: Family History  Problem Relation Age of Onset  . Angina Father   . Hypertension Father   . Heart disease Father     MI, angina  . Breast cancer Mother   . Heart disease Mother     Valve disease  . Cancer Brother     Brain tumor  . Colon cancer Neg Hx   . Prostate cancer Neg Hx   . Colon cancer Neg Hx   . Colon polyps Neg Hx   . Kidney disease Neg Hx   . Esophageal cancer Neg Hx     SOCIAL HISTORY: History   Social History  . Marital Status: Married    Spouse Name: N/A  . Number of Children: 2  . Years of Education: N/A   Occupational History  . Retired from [employer]    Social History Main Topics  . Smoking status: Former Smoker    Quit date: 06/17/1938  . Smokeless tobacco: Never Used  . Alcohol Use: 0.0 oz/week    0 Standard drinks or equivalent per week     Comment: Rare  . Drug Use: No  . Sexual Activity: Not on file   Other Topics Concern  . Not on file   Social History Narrative    The patient is retired.  He has been married since age        of 57.  They have 2 children.  They have grandchildren and great        grandchildren.      REVIEW OF SYSTEMS: Constitutional: No fevers, chills, or sweats, no generalized fatigue, change in appetite Eyes: No visual changes, double vision, eye pain Ear, nose and throat: No hearing loss, ear pain, nasal congestion, sore throat Cardiovascular: No chest pain, palpitations Respiratory:  No shortness of breath at rest or with exertion, wheezes GastrointestinaI: No nausea, vomiting, diarrhea, abdominal pain, fecal incontinence Genitourinary:  No dysuria, urinary retention or frequency Musculoskeletal:  +right shoulder and neck pain,no back pain Integumentary: No rash, pruritus, skin lesions Neurological: as above Psychiatric: No depression, insomnia, anxiety Endocrine: No palpitations, fatigue, diaphoresis, mood swings, change in appetite,  change in weight, increased thirst Hematologic/Lymphatic:  No anemia, purpura, petechiae. Allergic/Immunologic: no itchy/runny eyes, nasal congestion, recent allergic reactions, rashes  PHYSICAL EXAM: Filed Vitals:   12/07/14 0922  BP: 116/74  Pulse: 56  Resp: 18  Orthostatic vital signs: Lying down BP 128/72 HR 59, sitting BP 120/70 HR 70, standing BP 110/74 HR 76 (asymptomatic) General: No acute distress Head:  Normocephalic/atraumatic Eyes: Fundoscopic exam shows bilateral sharp discs, no vessel changes, exudates, or hemorrhages Neck: supple, no paraspinal tenderness, full range of motion Back: No paraspinal tenderness Heart: regular rate and rhythm Lungs: Clear to auscultation bilaterally. Vascular: No carotid bruits. Skin/Extremities: No rash, no edema Neurological Exam: Mental status: alert and oriented to person, place, and time, no dysarthria or aphasia, Fund of knowledge is appropriate.  Recent and remote memory are intact.  Attention and concentration are normal.    Able to name objects and repeat phrases. Cranial nerves: CN I: not tested CN II: pupils equal, round and reactive to light, visual fields intact, fundi unremarkable. CN III, IV, VI:  full range of motion, no nystagmus, no ptosis CN V: facial sensation intact CN VII: upper and lower face symmetric CN VIII: hearing intact to finger rub CN IX, X: gag intact, uvula midline CN XI: sternocleidomastoid and trapezius muscles intact CN XII: tongue midline Bulk & Tone: normal, no fasciculations. Motor: 5/5 throughout with no pronator drift. Sensation: decreased vibration sense to both knees, otherwise intact to light touch, cold, pin, and joint position sense.  No extinction to double simultaneous stimulation.  Romberg test slight sway Deep Tendon Reflexes: +1 throughout except for absent ankle jerks bilaterally, no ankle clonus Plantar responses: downgoing bilaterally Cerebellar: no incoordination on finger to nose,  heel to shin. No dysdiadochokinesia Gait: narrow-based and steady, able to tandem walk adequately. Tremor: mild endpoint bilateral tremor, no resting tremor  IMPRESSION: This is a very pleasant 81 year old right-handed man with a history of hypertension, hyperlipidemia, diabetes, ischemic cardiomyopathy s/p ICD insertion, chronic renal insufficiency, diverticulitis, who had a syncopal episode last 10/10/14. Pacemaker interrogated which was normal. He has had recurrent dizziness episodes for several years, and reports similar but longer dizziness preceding the syncopal episode. He reports the dizzy episodes have stopped since reducing dose of Coreg. Head CT did not show any acute changes, note of a remote right occipital infarct that he is asymptomatic from. Potentially symptoms due to vertebrobasilar insufficiency. He is also noted to have orthostatic hypotension today but is asymptomatic. Carotid dopplers in 03/2014 reported abnormal antegrade flow in the right vertebral artery. He is unable to do an MRA due to defibrillator, as well as CTA head due to renal insufficiency. We discussed that with intracranial stenosis, maximum medical management is recommended with lipid control and antiplatelet agents. Continue daily statin. He had a GI bleed in February, per records aspirin to be held for 2 weeks, however he has not restarted this except for headaches. He will speak with his GI specialist, and once cleared, restart daily aspirin 81mg  and monitor for signs of bleeding. He knows to go to the ER if symptoms recur. [place] driving laws were discussed with the patient, and he knows to stop driving after an episode of loss of consciousness, until 6 months event-free. He will follow-up on an as needed basis and knows to call our office for any change in symptoms.   Thank you for allowing me to participate in the care of this patient. Please do not hesitate to call for any questions or concerns.   Ellouise Newer,  M.D.  CC: Dr. Percival Spanish, Dr. Damita Dunnings

## 2014-12-16 DIAGNOSIS — L602 Onychogryphosis: Secondary | ICD-10-CM | POA: Diagnosis not present

## 2014-12-16 DIAGNOSIS — E1151 Type 2 diabetes mellitus with diabetic peripheral angiopathy without gangrene: Secondary | ICD-10-CM | POA: Diagnosis not present

## 2014-12-20 ENCOUNTER — Encounter: Payer: Self-pay | Admitting: Internal Medicine

## 2014-12-20 ENCOUNTER — Ambulatory Visit (INDEPENDENT_AMBULATORY_CARE_PROVIDER_SITE_OTHER): Payer: Medicare Other | Admitting: *Deleted

## 2014-12-20 ENCOUNTER — Telehealth: Payer: Self-pay | Admitting: Cardiology

## 2014-12-20 DIAGNOSIS — I5022 Chronic systolic (congestive) heart failure: Secondary | ICD-10-CM

## 2014-12-20 DIAGNOSIS — I255 Ischemic cardiomyopathy: Secondary | ICD-10-CM

## 2014-12-20 NOTE — Progress Notes (Signed)
Remote ICD transmission.   

## 2014-12-20 NOTE — Telephone Encounter (Signed)
Informed pt that his transmission was received.  

## 2014-12-20 NOTE — Telephone Encounter (Signed)
New Message (Device)   4. Are you calling to see if we received your device transmission? Yes

## 2014-12-24 ENCOUNTER — Other Ambulatory Visit: Payer: Self-pay | Admitting: Family Medicine

## 2014-12-25 ENCOUNTER — Other Ambulatory Visit: Payer: Self-pay | Admitting: Family Medicine

## 2014-12-25 DIAGNOSIS — IMO0002 Reserved for concepts with insufficient information to code with codable children: Secondary | ICD-10-CM

## 2014-12-25 DIAGNOSIS — M109 Gout, unspecified: Secondary | ICD-10-CM

## 2014-12-25 DIAGNOSIS — E1129 Type 2 diabetes mellitus with other diabetic kidney complication: Secondary | ICD-10-CM

## 2014-12-25 DIAGNOSIS — E1165 Type 2 diabetes mellitus with hyperglycemia: Principal | ICD-10-CM

## 2014-12-25 DIAGNOSIS — E349 Endocrine disorder, unspecified: Secondary | ICD-10-CM

## 2014-12-25 LAB — CUP PACEART REMOTE DEVICE CHECK
Battery Voltage: 2.73 V
Brady Statistic AP VP Percent: 1.15 %
Brady Statistic AP VS Percent: 0 %
Brady Statistic AS VP Percent: 98.22 %
Brady Statistic RA Percent Paced: 1.15 %
Brady Statistic RV Percent Paced: 99.37 %
Date Time Interrogation Session: 20160705092727
HighPow Impedance: 45 Ohm
HighPow Impedance: 62 Ohm
Lead Channel Impedance Value: 342 Ohm
Lead Channel Impedance Value: 779 Ohm
Lead Channel Pacing Threshold Amplitude: 0.5 V
Lead Channel Pacing Threshold Amplitude: 1 V
Lead Channel Pacing Threshold Amplitude: 1.625 V
Lead Channel Pacing Threshold Pulse Width: 0.4 ms
Lead Channel Sensing Intrinsic Amplitude: 1.75 mV
Lead Channel Sensing Intrinsic Amplitude: 4.875 mV
Lead Channel Setting Pacing Amplitude: 2 V
Lead Channel Setting Pacing Amplitude: 2.25 V
Lead Channel Setting Pacing Amplitude: 2.5 V
Lead Channel Setting Pacing Pulse Width: 1 ms
Lead Channel Setting Sensing Sensitivity: 0.3 mV
MDC IDC MSMT LEADCHNL LV IMPEDANCE VALUE: 380 Ohm
MDC IDC MSMT LEADCHNL LV IMPEDANCE VALUE: 589 Ohm
MDC IDC MSMT LEADCHNL LV PACING THRESHOLD PULSEWIDTH: 1 ms
MDC IDC MSMT LEADCHNL RA IMPEDANCE VALUE: 475 Ohm
MDC IDC MSMT LEADCHNL RA SENSING INTR AMPL: 1.75 mV
MDC IDC MSMT LEADCHNL RV PACING THRESHOLD PULSEWIDTH: 0.4 ms
MDC IDC MSMT LEADCHNL RV SENSING INTR AMPL: 4.875 mV
MDC IDC SET LEADCHNL RV PACING PULSEWIDTH: 0.4 ms
MDC IDC SET ZONE DETECTION INTERVAL: 400 ms
MDC IDC STAT BRADY AS VS PERCENT: 0.63 %
Zone Setting Detection Interval: 300 ms
Zone Setting Detection Interval: 350 ms
Zone Setting Detection Interval: 350 ms

## 2014-12-27 ENCOUNTER — Encounter: Payer: Self-pay | Admitting: Cardiology

## 2014-12-27 ENCOUNTER — Ambulatory Visit (INDEPENDENT_AMBULATORY_CARE_PROVIDER_SITE_OTHER): Payer: Medicare Other | Admitting: Cardiology

## 2014-12-27 VITALS — BP 138/76 | HR 80 | Ht 73.0 in | Wt 214.7 lb

## 2014-12-27 DIAGNOSIS — I5022 Chronic systolic (congestive) heart failure: Secondary | ICD-10-CM | POA: Diagnosis not present

## 2014-12-27 DIAGNOSIS — I2510 Atherosclerotic heart disease of native coronary artery without angina pectoris: Secondary | ICD-10-CM

## 2014-12-27 DIAGNOSIS — I10 Essential (primary) hypertension: Secondary | ICD-10-CM

## 2014-12-27 NOTE — Patient Instructions (Signed)
Your physician wants you to follow-up in: 6 Months. You will receive a reminder letter in the mail two months in advance. If you don't receive a letter, please call our office to schedule the follow-up appointment.  Your physician has recommended you make the following change in your medication: START an over the counter Aspirin 81 mg daily

## 2014-12-27 NOTE — Progress Notes (Signed)
HPI Pt with known hx of CM who presents following a syncopal episode. This is described previously. Since I last saw him he's had no further presyncope or syncope or dizziness. He's had occasional nighttime left sided and mid upper chest discomfort. This is sporadic. It doesn't seem to happen during the day with his minimal activity. It goes away spontaneously and is mild. There are no associated symptoms. He has not taken any nitroglycerin for this. He's also had some more distressing right shoulder discomfort and he feels "knots" under his right scapula. These can be massaged out. He sometimes keep him from sleeping. He has minimal ambulation but can go occasionally 150 feet to his mailbox and has not had any significant symptoms related to this recently. He overall just doesn't feel as well as he used to.  Of note the patient did see a neurologist and there was no neurologic etiology of his syncope identified.  I reviewed this report.    Allergies  Allergen Reactions  . Celecoxib     REACTION: myalgia: abdominal pain  . Nsaids     REACTION: renal disease  . Rosuvastatin     REACTION: myalgia: musle pain   Past Medical History  Diagnosis Date  . HTN (hypertension) (06/17/1981)  . Diverticula of colon (06/18/1983)  . Renal insufficiency   . Hyperlipidemia 06/17/1993)  . Diabetes mellitus  (06/17/1997)  . PUD (peptic ulcer disease) 1960's  . Pancreatitis 01/24 - 07/13/08    ABD CT  fatty liver early pancreatitis  07/12/2008  Pelvic CT Nml 07/12/2008  . Cardiomyopathy     EF 25% Ant Akinesis Global Hypo 3/2-08/18/08  . Thyroid nodule     per Dr. Brantley Stage  . Renal disease     per Dr. Posey Pronto  . Hypogonadism male   . GIB (gastrointestinal bleeding)     admission 07/2014, likely diverticular bleeding  . Arthritis   . Lower GI bleed 08/08/2014   Past Surgical History  Procedure Laterality Date  . Colonoscopy  12/2007    4 mm sigmoid HP polyp, diverticulosis. random biopsies negative for  colitis.   . Cardiac catheterization       Severe 3-vessel coronary artery disease.  Ischemic   cardiomyopathy.   . Nephrectomy  01/09/09    Partial left nephrectomy. Clear Cell Renal Cell Carcinoma 01/09/2009  . Cardiac defibrillator placement      ICD-Medtronic .  [id number]  . Laparotomy       small mid 80's (laparotomy), 09/12/2000 (no surgery), 10/20/2008 (no surgery)  . Lesion removal      RLE 11/14/2006 (Dr Hulen Skains)   . Ct of abdomen  06/1997    Fatty process unchanged - myelolipoma adrenal unchangd  . Ct of abdomen  02/99    Adrenal mass unchanged, no further follow up required  . Sbo  08/2000 no surgery   10/20/08 no surgery    Mid 80's laparotomy  . Esophagogastroduodenoscopy  07/12/2008    Schatzki Ring 2 cm. HH (Dr. Deatra Ina)  . Abdominal US  07/12/08    Fatty infiltrate liver complex cyst in lower pole left kidney  . Icd/ crt (lv lead disabled)      Medtronic [id number]  . Coronary artery bypass graft  09/05/08     Mediastinotomy, extracorporeal circulation,   coronary artery bypass graft surgery x4 using a left internal mammary  artery graft to the left anterior descending coronary artery, with a  saphenous vein graft to the first diagonal branch of  the LAD, a   saphenous vein graft to the obtuse marginal branch of the left   circumflex coronary artery,  Dr. Cyndia Bent  . Cholecystectomy  1970's  . Esophagogastroduodenoscopy N/A 08/09/2014    Procedure: ESOPHAGOGASTRODUODENOSCOPY (EGD);  Surgeon: Ladene Artist, MD;  Location: [facility] ENDOSCOPY;  Service: Endoscopy;  Laterality: N/A;  . Colonoscopy with propofol N/A 08/10/2014    Procedure: COLONOSCOPY WITH PROPOFOL;  Surgeon: Ladene Artist, MD;  Location[facility] ENDOSCOPY;  Service: Endoscopy;  Laterality: N/A;     Current Outpatient Prescriptions  Medication Sig Dispense Refill  . carvedilol (COREG) 6.25 MG tablet Take by mouth. Take 1/2 tablet twice a day    . Cholecalciferol (VITAMIN D) 2000 UNITS tablet Take 2,000 Units by mouth  daily.    . Insulin Glargine (LANTUS) 100 UNIT/ML Solostar Pen Inject 28 Units into the skin daily.    [name] LANTUS SOLOSTAR 100 UNIT/ML Solostar Pen INJECT 20 UNITS SUB-QUTANEOUSLY DAILY 15 pen 6  . lisinopril (PRINIVIL,ZESTRIL) 2.5 MG tablet TAKE 1 TABLET BY MOUTH DAILY 30 tablet 5  . nitroGLYCERIN (NITROSTAT) 0.4 MG SL tablet Place 1 tablet (0.4 mg total) under the tongue every 5 (five) minutes as needed. 25 tablet 6  . Nutritional Supplements (GLUCERNA SHAKE PO) Take 4 oz by mouth daily after breakfast.      . pravastatin (PRAVACHOL) 40 MG tablet TAKE 1 TABLET BY MOUTH AT BEDTIME 30 tablet 8  . testosterone cypionate (DEPOTESTOTERONE CYPIONATE) 200 MG/ML injection INJECT 1.25ML INTO THE MUSCLE EVERY 28 DAYS 10 mL 0   No current facility-administered medications for this visit.   ROS.  Erectile dysfunction. Otherwise as stated in the HPI and negative for all other systems.  PHYSICAL EXAM BP 138/76 mmHg  Pulse 80  Ht 6\' 1"  (1.854 m)  Wt 214 lb 11.2 oz (97.387 kg)  BMI 28.33 kg/m2 GENERAL:  Frail appearing HEENT:  Bruising in his right eye, otherwise unremarkable NECK:  Flat JVD, no bruits. no thyromegaly LUNGS:  Clear to auscultation bilaterally BACK:  No CVA tenderness CHEST:  Well healed sternotomy scar, ICD pocket intact HEART:  RRR, nml S1/2 no S3, no S4, no clicks, no rubs, no murmurs ABD:  Flat, positive bowel sounds normal in frequency in pitch, no bruits, no rebound, no guarding, no midline pulsatile mass, no hepatomegaly, no splenomegaly, small umbilical hernia EXT:  1+ DPs, trace edema bilaterally, no cyanosis no clubbing NEURO:  Nonfocal.   EKG:  NSR. BiV paced rate 80.  12/27/2014  ASSESSMENT AND PLAN  SYNCOPE -  The patient has had no further events. No further syncope workup is planned.  CAD (coronary artery disease) -  The patient should be taking a baby aspirin. We discussed this. He should take nitroglycerin with any chest discomfort. However, given the infrequency  of symptoms I would not prescribe any changes. I don't think further testing is indicated.  CHRONIC SYSTOLIC HEART FAILURE -   He seems to be euvolemic. No change in therapy is indicated.  HTN - The blood pressure is at target. No change in medications is indicated. We will continue with therapeutic lifestyle changes (TLC).  Dizziness-   He is no longer getting this.   DIABETES -  I asked him to follow up with Elsie Stain, MD since his last A1c was 9.8.

## 2014-12-28 ENCOUNTER — Other Ambulatory Visit (INDEPENDENT_AMBULATORY_CARE_PROVIDER_SITE_OTHER): Payer: Medicare Other

## 2014-12-28 DIAGNOSIS — E1165 Type 2 diabetes mellitus with hyperglycemia: Secondary | ICD-10-CM | POA: Diagnosis not present

## 2014-12-28 DIAGNOSIS — M109 Gout, unspecified: Secondary | ICD-10-CM

## 2014-12-28 DIAGNOSIS — E291 Testicular hypofunction: Secondary | ICD-10-CM

## 2014-12-28 DIAGNOSIS — IMO0002 Reserved for concepts with insufficient information to code with codable children: Secondary | ICD-10-CM

## 2014-12-28 DIAGNOSIS — E1129 Type 2 diabetes mellitus with other diabetic kidney complication: Secondary | ICD-10-CM

## 2014-12-28 DIAGNOSIS — E349 Endocrine disorder, unspecified: Secondary | ICD-10-CM

## 2014-12-28 LAB — LIPID PANEL
CHOL/HDL RATIO: 4
CHOLESTEROL: 145 mg/dL (ref 0–200)
HDL: 37.9 mg/dL — ABNORMAL LOW (ref >39.00)
NonHDL: 107.1
Triglycerides: 211 mg/dL — ABNORMAL HIGH (ref 0.0–149.0)
VLDL: 42.2 mg/dL — ABNORMAL HIGH (ref 0.0–40.0)

## 2014-12-28 LAB — BASIC METABOLIC PANEL
BUN: 36 mg/dL — AB (ref 6–23)
CO2: 25 meq/L (ref 19–32)
CREATININE: 2.76 mg/dL — AB (ref 0.40–1.50)
Calcium: 9.1 mg/dL (ref 8.4–10.5)
Chloride: 103 mEq/L (ref 96–112)
GFR: 23.28 mL/min — ABNORMAL LOW (ref >60.00)
GLUCOSE: 193 mg/dL — AB (ref 70–99)
Potassium: 5.5 mEq/L — ABNORMAL HIGH (ref 3.5–5.1)
SODIUM: 136 meq/L (ref 135–145)

## 2014-12-28 LAB — LDL CHOLESTEROL, DIRECT: LDL DIRECT: 84 mg/dL

## 2014-12-28 LAB — TESTOSTERONE: Testosterone: 230.01 ng/dL — ABNORMAL LOW (ref 300.00–890.00)

## 2014-12-28 LAB — URIC ACID: URIC ACID, SERUM: 7.5 mg/dL (ref 4.0–7.8)

## 2014-12-28 LAB — HEMOGLOBIN A1C: HEMOGLOBIN A1C: 9.3 % — AB (ref 4.6–6.5)

## 2015-01-05 ENCOUNTER — Encounter: Payer: Self-pay | Admitting: Family Medicine

## 2015-01-05 ENCOUNTER — Ambulatory Visit (INDEPENDENT_AMBULATORY_CARE_PROVIDER_SITE_OTHER): Payer: Medicare Other | Admitting: Family Medicine

## 2015-01-05 VITALS — BP 130/66 | HR 71 | Temp 97.5°F | Wt 217.2 lb

## 2015-01-05 DIAGNOSIS — E1129 Type 2 diabetes mellitus with other diabetic kidney complication: Secondary | ICD-10-CM | POA: Diagnosis not present

## 2015-01-05 DIAGNOSIS — E291 Testicular hypofunction: Secondary | ICD-10-CM | POA: Diagnosis not present

## 2015-01-05 DIAGNOSIS — E1165 Type 2 diabetes mellitus with hyperglycemia: Secondary | ICD-10-CM

## 2015-01-05 DIAGNOSIS — IMO0002 Reserved for concepts with insufficient information to code with codable children: Secondary | ICD-10-CM

## 2015-01-05 DIAGNOSIS — E1142 Type 2 diabetes mellitus with diabetic polyneuropathy: Secondary | ICD-10-CM

## 2015-01-05 DIAGNOSIS — E349 Endocrine disorder, unspecified: Secondary | ICD-10-CM

## 2015-01-05 NOTE — Patient Instructions (Addendum)
Call about a testosterone shot here.  I'll await the renal/kidney labs and visit notes.   Gradually increase your insulin until your AM sugar is consistently under 150.   Recheck labs in about 3 months before a visit.   Take care.  Glad to see you.

## 2015-01-05 NOTE — Progress Notes (Signed)
Pre visit review using our clinic review tool, if applicable. No additional management support is needed unless otherwise documented below in the visit note.  DM2.  D/w pt about low K and low sugar diet.  A1c up, d/w pt.   Not checking sugar often.   TG up. D/w pt.  Cr stable, d/w pt.  Has f/u with renal pending.  Has f/u eye exam pending for the fall.  More foot tingling noted by patient.   No syncope and not lightheaded in the meantime since his BB was decreased.  He feels well.    His great grandchild was born 35 months early.  She is 88 weeks old now, still in NICU.  Meds, vitals, and allergies reviewed.   ROS: See HPI.  Otherwise, noncontributory.  GEN: nad, alert and oriented HEENT: mucous membranes moist NECK: supple w/o LA CV: rrr PULM: ctab, no inc wob ABD: soft, +bs EXT: no edema SKIN: no acute rash  Dec sensation in the BLE above the ankle, likely from DM2, d/w pt.

## 2015-01-06 DIAGNOSIS — E1140 Type 2 diabetes mellitus with diabetic neuropathy, unspecified: Secondary | ICD-10-CM | POA: Insufficient documentation

## 2015-01-06 NOTE — Assessment & Plan Note (Signed)
D/w pt.  Return for repeat dosing.

## 2015-01-06 NOTE — Assessment & Plan Note (Signed)
He needs to be adherent to meds, inc insulin as instructed and work on diet.  D/w pt about low K and low sugar diet.  A1c up, d/w pt.   Not checking sugar often.   TG up. D/w pt. Needs work on diet.   Cr stable, d/w pt.  Has f/u with renal pending.  Has f/u eye exam pending for the fall.  More foot tingling noted by patient.  D/w pt.   >25 minutes spent in face to face time with patient, >50% spent in counselling or coordination of care

## 2015-01-11 ENCOUNTER — Encounter: Payer: Self-pay | Admitting: *Deleted

## 2015-01-18 DIAGNOSIS — N184 Chronic kidney disease, stage 4 (severe): Secondary | ICD-10-CM | POA: Diagnosis not present

## 2015-01-18 DIAGNOSIS — N189 Chronic kidney disease, unspecified: Secondary | ICD-10-CM | POA: Diagnosis not present

## 2015-01-18 DIAGNOSIS — N2581 Secondary hyperparathyroidism of renal origin: Secondary | ICD-10-CM | POA: Diagnosis not present

## 2015-01-20 DIAGNOSIS — E875 Hyperkalemia: Secondary | ICD-10-CM | POA: Diagnosis not present

## 2015-01-21 ENCOUNTER — Other Ambulatory Visit: Payer: Self-pay | Admitting: Family Medicine

## 2015-01-24 ENCOUNTER — Emergency Department (HOSPITAL_COMMUNITY)
Admission: EM | Admit: 2015-01-24 | Discharge: 2015-01-24 | Disposition: A | Discharge status: Home / Self Care | Payer: Medicare Other | Attending: Emergency Medicine | Admitting: Emergency Medicine

## 2015-01-24 ENCOUNTER — Encounter (HOSPITAL_COMMUNITY): Payer: Self-pay | Admitting: *Deleted

## 2015-01-24 DIAGNOSIS — M1990 Unspecified osteoarthritis, unspecified site: Secondary | ICD-10-CM | POA: Insufficient documentation

## 2015-01-24 DIAGNOSIS — Z87891 Personal history of nicotine dependence: Secondary | ICD-10-CM | POA: Insufficient documentation

## 2015-01-24 DIAGNOSIS — N184 Chronic kidney disease, stage 4 (severe): Secondary | ICD-10-CM | POA: Insufficient documentation

## 2015-01-24 DIAGNOSIS — I10 Essential (primary) hypertension: Secondary | ICD-10-CM | POA: Diagnosis not present

## 2015-01-24 DIAGNOSIS — Z9889 Other specified postprocedural states: Secondary | ICD-10-CM | POA: Insufficient documentation

## 2015-01-24 DIAGNOSIS — D631 Anemia in chronic kidney disease: Secondary | ICD-10-CM | POA: Diagnosis not present

## 2015-01-24 DIAGNOSIS — Z8719 Personal history of other diseases of the digestive system: Secondary | ICD-10-CM | POA: Insufficient documentation

## 2015-01-24 DIAGNOSIS — Z951 Presence of aortocoronary bypass graft: Secondary | ICD-10-CM | POA: Insufficient documentation

## 2015-01-24 DIAGNOSIS — Z7982 Long term (current) use of aspirin: Secondary | ICD-10-CM | POA: Insufficient documentation

## 2015-01-24 DIAGNOSIS — E230 Hypopituitarism: Secondary | ICD-10-CM | POA: Insufficient documentation

## 2015-01-24 DIAGNOSIS — E119 Type 2 diabetes mellitus without complications: Secondary | ICD-10-CM | POA: Diagnosis not present

## 2015-01-24 DIAGNOSIS — Z794 Long term (current) use of insulin: Secondary | ICD-10-CM | POA: Diagnosis not present

## 2015-01-24 DIAGNOSIS — Z950 Presence of cardiac pacemaker: Secondary | ICD-10-CM | POA: Diagnosis not present

## 2015-01-24 DIAGNOSIS — I129 Hypertensive chronic kidney disease with stage 1 through stage 4 chronic kidney disease, or unspecified chronic kidney disease: Secondary | ICD-10-CM | POA: Insufficient documentation

## 2015-01-24 DIAGNOSIS — R42 Dizziness and giddiness: Secondary | ICD-10-CM | POA: Insufficient documentation

## 2015-01-24 DIAGNOSIS — R404 Transient alteration of awareness: Secondary | ICD-10-CM | POA: Diagnosis not present

## 2015-01-24 DIAGNOSIS — Z79899 Other long term (current) drug therapy: Secondary | ICD-10-CM | POA: Diagnosis not present

## 2015-01-24 DIAGNOSIS — E785 Hyperlipidemia, unspecified: Secondary | ICD-10-CM | POA: Diagnosis not present

## 2015-01-24 DIAGNOSIS — N2581 Secondary hyperparathyroidism of renal origin: Secondary | ICD-10-CM | POA: Diagnosis not present

## 2015-01-24 LAB — COMPREHENSIVE METABOLIC PANEL
ALBUMIN: 3.1 g/dL — AB (ref 3.5–5.0)
ALT: 15 U/L — AB (ref 17–63)
AST: 22 U/L (ref 15–41)
Alkaline Phosphatase: 77 U/L (ref 38–126)
Anion gap: 12 (ref 5–15)
BUN: 30 mg/dL — ABNORMAL HIGH (ref 6–20)
CO2: 19 mmol/L — ABNORMAL LOW (ref 22–32)
CREATININE: 2.65 mg/dL — AB (ref 0.61–1.24)
Calcium: 8 mg/dL — ABNORMAL LOW (ref 8.9–10.3)
Chloride: 101 mmol/L (ref 101–111)
GFR calc Af Amer: 23 mL/min — ABNORMAL LOW (ref >60)
GFR, EST NON AFRICAN AMERICAN: 20 mL/min — AB (ref >60)
Glucose, Bld: 350 mg/dL — ABNORMAL HIGH (ref 65–99)
POTASSIUM: 4.6 mmol/L (ref 3.5–5.1)
Sodium: 132 mmol/L — ABNORMAL LOW (ref 135–145)
TOTAL PROTEIN: 5.8 g/dL — AB (ref 6.5–8.1)
Total Bilirubin: 0.5 mg/dL (ref 0.3–1.2)

## 2015-01-24 LAB — CBC
HCT: 35.8 % — ABNORMAL LOW (ref 39.0–52.0)
Hemoglobin: 12 g/dL — ABNORMAL LOW (ref 13.0–17.0)
MCH: 29.7 pg (ref 26.0–34.0)
MCHC: 33.5 g/dL (ref 30.0–36.0)
MCV: 88.6 fL (ref 78.0–100.0)
Platelets: 154 10*3/uL (ref 150–400)
RBC: 4.04 MIL/uL — ABNORMAL LOW (ref 4.22–5.81)
RDW: 14 % (ref 11.5–15.5)
WBC: 8.4 10*3/uL (ref 4.0–10.5)

## 2015-01-24 LAB — CBG MONITORING, ED: Glucose-Capillary: 296 mg/dL — ABNORMAL HIGH (ref 65–99)

## 2015-01-24 LAB — TROPONIN I

## 2015-01-24 MED ORDER — SODIUM CHLORIDE 0.9 % IV BOLUS (SEPSIS)
500.0000 mL | Freq: Once | INTRAVENOUS | Status: AC
  Administered 2015-01-24: 500 mL via INTRAVENOUS

## 2015-01-24 MED ORDER — SODIUM CHLORIDE 0.9 % IV BOLUS (SEPSIS): 1000.0000 mL | Freq: Once | INTRAVENOUS | Status: DC

## 2015-01-24 NOTE — ED Provider Notes (Signed)
CSN: [id number]     Arrival date & time 01/24/15  1652 History   First MD Initiated Contact with Patient 01/24/15 1701     Chief Complaint  Patient presents with  . Dizziness     (Consider location/radiation/quality/duration/timing/severity/associated sxs/prior Treatment) Patient is a 81 y.o. male presenting with dizziness. The history is provided by the patient and medical records. No language interpreter was used.  Dizziness Associated symptoms: no chest pain, no diarrhea, no headaches, no nausea, no shortness of breath and no vomiting    [name] is a 81 y.o. male  with a hx of hypertension, chronic kidney disease, peptic ulcer disease, cardiomyopathy (global akinesis and an EF of 25%), insulin-dependent diabetes, presents to the Emergency Department after a 20 second episode of lightheadedness approximately 30 minutes prior to arrival. Patient reports that he was standing in the sun when he became briefly lightheaded. He reports that it resolved as soon as he walked inside and sat down in the air-conditioning. He had no associated chest pain, shortness of breath, diaphoresis or full syncope. Patient reports several similar episodes in April 2016. At that time his carvedilol dosage was decreased to 6.25 mg. He reports no other issues since that time. He reports 2 cups of coffee in a glass of sweet tea today but no water. He also reports slightly decreased food today. He reports that he continues to feel well and has had no return of his symptoms. EMS reports orthostatic hypotension on scene with an initial blood pressure of 136/62 which decreased to 106/57 upon standing.  Patient denies recent illness, fever, chills, headache or neck pain, chest pain, shortness of breath, abdominal pain, nausea, vomiting, diarrhea, dysuria, hematuria.  Past Medical History  Diagnosis Date  . HTN (hypertension) (06/17/1981)  . Diverticula of colon (06/18/1983)  . Renal insufficiency   . Hyperlipidemia  06/17/1993)  . Diabetes mellitus  (06/17/1997)  . PUD (peptic ulcer disease) 1960's  . Pancreatitis 01/24 - 07/13/08    ABD CT  fatty liver early pancreatitis  07/12/2008  Pelvic CT Nml 07/12/2008  . Cardiomyopathy     EF 25% Ant Akinesis Global Hypo 3/2-08/18/08  . Thyroid nodule     per Dr. Brantley Stage  . Renal disease     per Dr. Posey Pronto  . Hypogonadism male   . GIB (gastrointestinal bleeding)     admission 07/2014, likely diverticular bleeding  . Arthritis   . Lower GI bleed 08/08/2014   Past Surgical History  Procedure Laterality Date  . Colonoscopy  12/2007    4 mm sigmoid HP polyp, diverticulosis. random biopsies negative for colitis.   . Cardiac catheterization       Severe 3-vessel coronary artery disease.  Ischemic   cardiomyopathy.   . Nephrectomy  01/09/09    Partial left nephrectomy. Clear Cell Renal Cell Carcinoma 01/09/2009  . Cardiac defibrillator placement      ICD-Medtronic .  [id number]  . Laparotomy       small mid 80's (laparotomy), 09/12/2000 (no surgery), 10/20/2008 (no surgery)  . Lesion removal      RLE 11/14/2006 (Dr Hulen Skains)   . Ct of abdomen  06/1997    Fatty process unchanged - myelolipoma adrenal unchangd  . Ct of abdomen  02/99    Adrenal mass unchanged, no further follow up required  . Sbo  08/2000 no surgery   10/20/08 no surgery    Mid 80's laparotomy  . Esophagogastroduodenoscopy  07/12/2008    Schatzki Ring 2 cm. HH (  Dr. Deatra Ina)  . Abdominal US  07/12/08    Fatty infiltrate liver complex cyst in lower pole left kidney  . Icd/ crt (lv lead disabled)      Medtronic [id number]  . Coronary artery bypass graft  09/05/08     Mediastinotomy, extracorporeal circulation,   coronary artery bypass graft surgery x4 using a left internal mammary  artery graft to the left anterior descending coronary artery, with a  saphenous vein graft to the first diagonal branch of the LAD, a   saphenous vein graft to the obtuse marginal branch of the left   circumflex coronary artery,   Dr. Cyndia Bent  . Cholecystectomy  1970's  . Esophagogastroduodenoscopy N/A 08/09/2014    Procedure: ESOPHAGOGASTRODUODENOSCOPY (EGD);  Surgeon: Ladene Artist, MD;  Location: [facility] ENDOSCOPY;  Service: Endoscopy;  Laterality: N/A;  . Colonoscopy with propofol N/A 08/10/2014    Procedure: COLONOSCOPY WITH PROPOFOL;  Surgeon: Ladene Artist, MD;  Location: [facility] ENDOSCOPY;  Service: Endoscopy;  Laterality: N/A;   Family History  Problem Relation Age of Onset  . Angina Father   . Hypertension Father   . Heart disease Father     MI, angina  . Breast cancer Mother   . Heart disease Mother     Valve disease  . Cancer Brother     Brain tumor  . Colon cancer Neg Hx   . Prostate cancer Neg Hx   . Colon cancer Neg Hx   . Colon polyps Neg Hx   . Kidney disease Neg Hx   . Esophageal cancer Neg Hx    History  Substance Use Topics  . Smoking status: Former Smoker    Quit date: 06/17/1938  . Smokeless tobacco: Never Used  . Alcohol Use: 0.0 oz/week    0 Standard drinks or equivalent per week     Comment: Rare    Review of Systems  Constitutional: Negative for fever, diaphoresis, appetite change, fatigue and unexpected weight change.  HENT: Negative for mouth sores.   Eyes: Negative for visual disturbance.  Respiratory: Negative for cough, chest tightness, shortness of breath and wheezing.   Cardiovascular: Negative for chest pain.  Gastrointestinal: Negative for nausea, vomiting, abdominal pain, diarrhea and constipation.  Endocrine: Negative for polydipsia, polyphagia and polyuria.  Genitourinary: Negative for dysuria, urgency, frequency and hematuria.  Musculoskeletal: Negative for back pain and neck stiffness.  Skin: Negative for rash.  Allergic/Immunologic: Negative for immunocompromised state.  Neurological: Positive for dizziness and light-headedness ( resolved). Negative for syncope and headaches.  Hematological: Does not bruise/bleed easily.  Psychiatric/Behavioral: Negative for  sleep disturbance. The patient is not nervous/anxious.       Allergies  Celecoxib; Nsaids; and Rosuvastatin  Home Medications   Prior to Admission medications   Medication Sig Start Date End Date Taking? Authorizing Provider  aspirin 81 MG tablet Take 81 mg by mouth daily.    Historical Provider, MD  carvedilol (COREG) 6.25 MG tablet Take by mouth. 6.25mg  twice a day    Historical Provider, MD  Cholecalciferol (VITAMIN D) 2000 UNITS tablet Take 2,000 Units by mouth daily.    Historical Provider, MD  Insulin Glargine (LANTUS) 100 UNIT/ML Solostar Pen Inject 28 Units into the skin daily.    Historical Provider, MD  lisinopril (PRINIVIL,ZESTRIL) 2.5 MG tablet TAKE 1 TABLET BY MOUTH DAILY 06/20/14   Tonia Ghent, MD  nitroGLYCERIN (NITROSTAT) 0.4 MG SL tablet Place 1 tablet (0.4 mg total) under the tongue every 5 (five) minutes as needed. 08/05/14  Tonia Ghent, MD  Nutritional Supplements (GLUCERNA SHAKE PO) Take 4 oz by mouth daily after breakfast.      Historical Provider, MD  pravastatin (PRAVACHOL) 40 MG tablet TAKE 1 TABLET BY MOUTH AT BEDTIME 01/23/15   Tonia Ghent, MD  testosterone cypionate (DEPOTESTOTERONE CYPIONATE) 200 MG/ML injection INJECT 1.25ML INTO THE MUSCLE EVERY 28 DAYS 06/30/14   Tonia Ghent, MD   BP 123/54 mmHg  Pulse 73  Temp(Src) 97.7 F (36.5 C) (Oral)  Resp 23  SpO2 96% Physical Exam  Constitutional: He appears well-developed and well-nourished. No distress.  Awake, alert, nontoxic appearance  HENT:  Head: Normocephalic and atraumatic.  Mouth/Throat: Oropharynx is clear and moist. No oropharyngeal exudate.  Eyes: Conjunctivae are normal. No scleral icterus.  Neck: Normal range of motion. Neck supple.  Cardiovascular: Normal rate, regular rhythm, normal heart sounds and intact distal pulses.   No murmur heard. Pulmonary/Chest: Effort normal and breath sounds normal. No respiratory distress. He has no wheezes.  Equal chest expansion  Abdominal:  Soft. Bowel sounds are normal. He exhibits no mass. There is no tenderness. There is no rebound and no guarding.  Musculoskeletal: Normal range of motion. He exhibits no edema.  Neurological: He is alert.  Speech is clear and goal oriented Moves extremities without ataxia  Skin: Skin is warm and dry. He is not diaphoretic.  Psychiatric: He has a normal mood and affect.  Nursing note and vitals reviewed.   ED Course  Procedures (including critical care time) Labs Review Labs Reviewed  CBC - Abnormal; Notable for the following:    RBC 4.04 (*)    Hemoglobin 12.0 (*)    HCT 35.8 (*)    All other components within normal limits  COMPREHENSIVE METABOLIC PANEL - Abnormal; Notable for the following:    Sodium 132 (*)    CO2 19 (*)    Glucose, Bld 350 (*)    BUN 30 (*)    Creatinine, Ser 2.65 (*)    Calcium 8.0 (*)    Total Protein 5.8 (*)    Albumin 3.1 (*)    ALT 15 (*)    GFR calc non Af Amer 20 (*)    GFR calc Af Amer 23 (*)    All other components within normal limits  CBG MONITORING, ED - Abnormal; Notable for the following:    Glucose-Capillary 296 (*)    All other components within normal limits  TROPONIN I    Imaging Review No results found.   EKG Interpretation   Date/Time:  Tuesday January 24 2015 16:55:26 EDT Ventricular Rate:  77 PR Interval:  226 QRS Duration: 205 QT Interval:  464 QTC Calculation: 525 R Axis:   39 Text Interpretation:  Electronic ventricular pacemaker No significant  change since last tracing Confirmed by Ashok Cordia  MD, Lennette Bihari ([id number]) on  01/24/2015 4:58:11 PM      MDM   Final diagnoses:  Lightheadedness  CKD (chronic kidney disease), stage IV  Pacemaker   [name] presents after brief episode of lightheadedness. No associated symptoms. EKG with ventricular pacemaker per baseline.  No tachycardia here in the emergency department.  Labs are reassuring and at baseline. Patient with elevated BUN and serum creatinine to baseline. No  hypoglycemia. Troponin negative. Patient ambulates here in the emergency department without return of lightheadedness. He was given 500 mL of fluid. He has no orthostasis here.  He requests discharge home.  Orthostatic VS for the past 24 hrs:  BP- Lying  Pulse- Lying BP- Sitting Pulse- Sitting BP- Standing at 0 minutes Pulse- Standing at 0 minutes  01/24/15 1753 134/65 mmHg 76 124/66 mmHg 79 137/60 mmHg 81    BP 123/54 mmHg  Pulse 73  Temp(Src) 97.7 F (36.5 C) (Oral)  Resp 23  SpO2 96%   Patient will be discharged home with close primary care follow-up within the next 24 hours. Should return precautions given for return of symptoms.  The patient was discussed with and seen by Dr. Roderic Palau who agrees with the treatment plan.    Jarrett Soho Venesa Semidey, PA-C 01/24/15 1835  Milton Ferguson, MD 01/25/15 [id number]

## 2015-01-24 NOTE — ED Notes (Signed)
Pt presents via [facility] after having a dizzy "spell".  Pt was at the [employer] and was walking and became dizzy, denies LOC, did not fall.  Initial BP 136/62 sitting, standing 106/57 with FD.  CBG intially 61-given crackers and juice came up to 389.  Pt has PPM, paced rhythm.  BP-126/78 P-67 O2-99% RA, denies pain, N/V/D.  Pt a x 4, NAD.

## 2015-01-24 NOTE — ED Notes (Signed)
MD at bedside. 

## 2015-01-24 NOTE — Discharge Instructions (Signed)
1. Medications: usual home medications °2. Treatment: rest, drink plenty of fluids,  °3. Follow Up: Please followup with your primary doctor in 1-2 days for discussion of your diagnoses and further evaluation after today's visit; if you do not have a primary care doctor use the resource guide provided to find one; Please return to the ER for return or worsening of symptoms ° ° ° ° °

## 2015-01-26 ENCOUNTER — Ambulatory Visit (INDEPENDENT_AMBULATORY_CARE_PROVIDER_SITE_OTHER): Payer: Medicare Other | Admitting: Family Medicine

## 2015-01-26 ENCOUNTER — Encounter: Payer: Self-pay | Admitting: Family Medicine

## 2015-01-26 VITALS — BP 140/80 | HR 60 | Temp 97.9°F | Wt 214.0 lb

## 2015-01-26 DIAGNOSIS — T671XXD Heat syncope, subsequent encounter: Secondary | ICD-10-CM

## 2015-01-26 DIAGNOSIS — R197 Diarrhea, unspecified: Secondary | ICD-10-CM | POA: Diagnosis not present

## 2015-01-26 DIAGNOSIS — K5900 Constipation, unspecified: Secondary | ICD-10-CM | POA: Diagnosis not present

## 2015-01-26 NOTE — Patient Instructions (Signed)
Push fluids to keep up with stool. Return to regular diet as tolerated, start with chicken soup and cracker. Hold senna. Call if diarrhea in not improving in next 48 hours.

## 2015-01-26 NOTE — Progress Notes (Signed)
Pre visit review using our clinic review tool, if applicable. No additional management support is needed unless otherwise documented below in the visit note. 

## 2015-01-26 NOTE — Assessment & Plan Note (Signed)
Had episode, ER eval neg. No further issue, no presyncope associated with diarrhea today. Pt feels he got to hot that day.

## 2015-01-26 NOTE — Progress Notes (Signed)
   Subjective:    Patient ID: [name], male    DOB: 08-14-1925, 81 y.o.   MRN: [id number]  HPI  81 year old male pt of Dr. Josefine Class with history of  CKD, CHF, HTN, DM, kidney neoplasm presents with constipation.  He reports constipation 1 day after starting kionex for hyperkalemia.. Caused extreme constipation. Minimal BMS in last 10 days. Low abd soreness.  Was nauseous, no emesis. No fever.  Has been eating regularly. Lots of water.  He has been using stool softner senna, daily in last 2 days   Now this AM he awoke with severe abdominal pain since then,  he has had 7 BMs, [place]y. No blood.   Mild abdominal soreness remains   Went to ER yesterday he went to ER with momentary dizziness.  Hg 12,  EKG stable, Given IVF.  BP nml.  CBG 296  Na 132  troponin nml  Cr stable at 2.65.  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     Review of Systems  Constitutional: Negative for fever and fatigue.  HENT: Negative for ear pain.   Eyes: Negative for pain.  Respiratory: Negative for cough and shortness of breath.   Cardiovascular: Negative for chest pain, palpitations and leg swelling.  Gastrointestinal: Negative for blood in stool and abdominal distention.  Genitourinary: Negative for dysuria.  Musculoskeletal: Negative for arthralgias.  Neurological: Negative for syncope, light-headedness and headaches.  Psychiatric/Behavioral: Negative for dysphoric mood.       Objective:   Physical Exam  Constitutional: Vital signs are normal. He appears well-developed and well-nourished.  elderly male in NAD  HENT:  Head: Normocephalic.  Right Ear: Hearing normal.  Left Ear: Hearing normal.  Nose: Nose normal.  Mouth/Throat: Oropharynx is clear and moist and mucous membranes are normal.  Neck: Trachea normal. Carotid bruit is not present. No thyroid mass and no thyromegaly present.  Cardiovascular: Normal rate, regular rhythm and normal pulses.  Exam reveals no  gallop, no distant heart sounds and no friction rub.   No murmur heard. No peripheral edema  Pulmonary/Chest: Effort normal and breath sounds normal. No respiratory distress.  Abdominal: Soft. He exhibits no mass. There is no hepatosplenomegaly. There is tenderness in the suprapubic area. There is no rigidity, no rebound, no guarding and no CVA tenderness.  Skin: Skin is warm, dry and intact. No rash noted.  Psychiatric: He has a normal mood and affect. His speech is normal and behavior is normal. Thought content normal.          Assessment & Plan:

## 2015-01-26 NOTE — Assessment & Plan Note (Signed)
Due to  SE of kionex. Occurred immediately after.  Now has gone to diarrhea after 2 days of senna.  recommend fluids intake, regular diet and holding senna.  if not improving or blood in stool, call.

## 2015-03-01 ENCOUNTER — Encounter: Payer: Self-pay | Admitting: Internal Medicine

## 2015-03-01 ENCOUNTER — Ambulatory Visit: Payer: Self-pay | Admitting: Internal Medicine

## 2015-03-01 ENCOUNTER — Ambulatory Visit (INDEPENDENT_AMBULATORY_CARE_PROVIDER_SITE_OTHER): Payer: Medicare Other | Admitting: Internal Medicine

## 2015-03-01 VITALS — BP 120/68 | HR 64 | Temp 97.4°F | Wt 214.0 lb

## 2015-03-01 DIAGNOSIS — R1084 Generalized abdominal pain: Secondary | ICD-10-CM | POA: Diagnosis not present

## 2015-03-01 DIAGNOSIS — K5900 Constipation, unspecified: Secondary | ICD-10-CM

## 2015-03-01 NOTE — Progress Notes (Signed)
Pre visit review using our clinic review tool, if applicable. No additional management support is needed unless otherwise documented below in the visit note. 

## 2015-03-01 NOTE — Patient Instructions (Signed)
Polyethylene Glycol powder What is this medicine? POLYETHYLENE GLYCOL 3350 (pol ee ETH i leen; GLYE col) powder is a laxative used to treat constipation. It increases the amount of water in the stool. Bowel movements become easier and more frequent. This medicine may be used for other purposes; ask your health care provider or pharmacist if you have questions. COMMON BRAND NAME(S): GaviLax, GlycoLax, MiraLax, [employer] What should I tell my health care provider before I take this medicine? They need to know if you have any of these conditions: -a history of blockage of the stomach or intestine -current abdomen distension or pain -difficulty swallowing -diverticulitis, ulcerative colitis, or other chronic bowel disease -phenylketonuria -an unusual or allergic reaction to polyethylene glycol, other medicines, dyes, or preservatives -pregnant or trying to get pregnant -breast-feeding How should I use this medicine? Take this medicine by mouth. The bottle has a measuring cap that is marked with a line. Pour the powder into the cap up to the marked line (the dose is about 1 heaping tablespoon). Add the powder in the cap to a full glass (4 to 8 ounces or 120 to 240 ml) of water, juice, soda, coffee or tea. Mix the powder well. Drink the solution. Take exactly as directed. Do not take your medicine more often than directed. Talk to your pediatrician regarding the use of this medicine in children. Special care may be needed. Overdosage: If you think you have taken too much of this medicine contact a poison control center or emergency room at once. NOTE: This medicine is only for you. Do not share this medicine with others. What if I miss a dose? If you miss a dose, take it as soon as you can. If it is almost time for your next dose, take only that dose. Do not take double or extra doses. What may interact with this medicine? Interactions are not expected. This list may not describe all possible  interactions. Give your health care provider a list of all the medicines, herbs, non-prescription drugs, or dietary supplements you use. Also tell them if you smoke, drink alcohol, or use illegal drugs. Some items may interact with your medicine. What should I watch for while using this medicine? Do not use for more than 2 weeks without advice from your doctor or health care professional. It can take 2 to 4 days to have a bowel movement and to experience improvement in constipation. See your health care professional for any changes in bowel habits, including constipation, that are severe or last longer than three weeks. Always take this medicine with plenty of water. What side effects may I notice from receiving this medicine? Side effects that you should report to your doctor or health care professional as soon as possible: -diarrhea -difficulty breathing -itching of the skin, hives, or skin rash -severe bloating, pain, or distension of the stomach -vomiting Side effects that usually do not require medical attention (report to your doctor or health care professional if they continue or are bothersome): -bloating or gas -lower abdominal discomfort or cramps -nausea This list may not describe all possible side effects. Call your doctor for medical advice about side effects. You may report side effects to FDA at 1-800-FDA-1088. Where should I keep my medicine? Keep out of the reach of children. Store between 15 and 30 degrees C (59 and 86 degrees F). Throw away any unused medicine after the expiration date. NOTE: This sheet is a summary. It may not cover all possible information. If  you have questions about this medicine, talk to your doctor, pharmacist, or health care provider.  2015, [employer]. (2008-01-04 16:50:45)

## 2015-03-01 NOTE — Progress Notes (Signed)
Subjective:    Patient ID: [name], male    DOB: [birth year], 81 y.o.   MRN: [id number]  HPI  Pt presents to the clinic today with c/o generalized abdominal pain. This has been intermittent over the last 4-5 weeks. He reports it is being caused by constipation. He thinks the constipation is being caused by a medication that Dr. Posey Pronto put him on the bring his potassium levels up (It is something he has to drink, he has only done in twice). He describes the abdominal pain as cramping. He will not have a BM for about 5 days, then he takes Senna for a few days, which is followed by 5-6 semi-loose BM's all in one day. He did have the loose stool today, his last BM prior to that was on Saturday. He denies blood in his stool. He wants to know if there is something safe and effective that he can take daily for his constipation.  Review of Systems      Past Medical History  Diagnosis Date  . HTN (hypertension) (06/17/1981)  . Diverticula of colon (06/18/1983)  . Renal insufficiency   . Hyperlipidemia 06/17/1993)  . Diabetes mellitus  (06/17/1997)  . PUD (peptic ulcer disease) 1960's  . Pancreatitis 01/24 - 07/13/08    ABD CT  fatty liver early pancreatitis  07/12/2008  Pelvic CT Nml 07/12/2008  . Cardiomyopathy     EF 25% Ant Akinesis Global Hypo 3/2-08/18/08  . Thyroid nodule     per Dr. Brantley Stage  . Renal disease     per Dr. Posey Pronto  . Hypogonadism male   . GIB (gastrointestinal bleeding)     admission 07/2014, likely diverticular bleeding  . Arthritis   . Lower GI bleed 08/08/2014    Current Outpatient Prescriptions  Medication Sig Dispense Refill  . aspirin 81 MG tablet Take 81 mg by mouth daily.    . carvedilol (COREG) 6.25 MG tablet Take by mouth. 6.25mg  twice a day    . Cholecalciferol (VITAMIN D) 2000 UNITS tablet Take 2,000 Units by mouth daily.    . Insulin Glargine (LANTUS) 100 UNIT/ML Solostar Pen Inject 28 Units into the skin daily.    [name] KIONEX 15 GM/60ML suspension TAKE  180 ML (45GRAMS TOTAL) AS ONE TIME DOSE  0  . lisinopril (PRINIVIL,ZESTRIL) 2.5 MG tablet TAKE 1 TABLET BY MOUTH DAILY 30 tablet 5  . nitroGLYCERIN (NITROSTAT) 0.4 MG SL tablet Place 1 tablet (0.4 mg total) under the tongue every 5 (five) minutes as needed. 25 tablet 6  . Nutritional Supplements (GLUCERNA SHAKE PO) Take 4 oz by mouth daily after breakfast.      . pravastatin (PRAVACHOL) 40 MG tablet TAKE 1 TABLET BY MOUTH AT BEDTIME 30 tablet 8  . testosterone cypionate (DEPOTESTOTERONE CYPIONATE) 200 MG/ML injection INJECT 1.25ML INTO THE MUSCLE EVERY 28 DAYS 10 mL 0   No current facility-administered medications for this visit.    Allergies  Allergen Reactions  . Celecoxib     REACTION: myalgia: abdominal pain  . Kayexalate [Polystyrene] Other (See Comments)    constipation  . Nsaids     REACTION: renal disease  . Rosuvastatin     REACTION: myalgia: musle pain    Family History  Problem Relation Age of Onset  . Angina Father   . Hypertension Father   . Heart disease Father     MI, angina  . Breast cancer Mother   . Heart disease Mother     Valve disease  .  Cancer Brother     Brain tumor  . Colon cancer Neg Hx   . Prostate cancer Neg Hx   . Colon cancer Neg Hx   . Colon polyps Neg Hx   . Kidney disease Neg Hx   . Esophageal cancer Neg Hx     Social History   Social History  . Marital Status: Married    Spouse Name: N/A  . Number of Children: 2  . Years of Education: N/A   Occupational History  . Retired from [employer]    Social History Main Topics  . Smoking status: Former Smoker    Quit date: 06/17/1938  . Smokeless tobacco: Never Used  . Alcohol Use: 0.0 oz/week    0 Standard drinks or equivalent per week     Comment: Rare  . Drug Use: No  . Sexual Activity: Not on file   Other Topics Concern  . Not on file   Social History Narrative    The patient is retired.  He has been married since age        of 11.   They have 2 children.  They have grandchildren and great        grandchildren.       Constitutional: Denies fever, malaise, fatigue, headache or abrupt weight changes.  Respiratory: Denies difficulty breathing, shortness of breath, cough or sputum production.   Cardiovascular: Denies chest pain, chest tightness, palpitations or swelling in the hands or feet.  Gastrointestinal: Pt reports abdominal pain and constipation. Denies bloating, diarrhea or blood in the stool.  GU: Denies urgency, frequency, pain with urination, burning sensation, blood in urine, odor or discharge.  No other specific complaints in a complete review of systems (except as listed in HPI above).  Objective:   Physical Exam  BP 120/68 mmHg  Pulse 64  Temp(Src) 97.4 F (36.3 C) (Oral)  Wt 214 lb (97.07 kg)  SpO2 98% Wt Readings from Last 3 Encounters:  03/01/15 214 lb (97.07 kg)  01/26/15 214 lb (97.07 kg)  01/05/15 217 lb 4 oz (98.544 kg)    General: Appears his stated age, obese in NAD. Cardiovascular: Normal rate and rhythm. S1,S2 noted.  No murmur, rubs or gallops noted. Pulmonary/Chest: Normal effort and positive vesicular breath sounds. No respiratory distress. No wheezes, rales or ronchi noted.  Abdomen: Distended but soft and nontender. Normal bowel sounds, no bruits noted.  Neurological: Alert and oriented.    BMET    Component Value Date/Time   NA 132* 01/24/2015 1711   NA 136 10/10/2014 1145   NA 139 10/11/2013   K 4.6 01/24/2015 1711   K 5.3* 10/10/2014 1145   CL 101 01/24/2015 1711   CL 108 10/10/2014 1145   CO2 19* 01/24/2015 1711   CO2 20* 10/10/2014 1145   GLUCOSE 350* 01/24/2015 1711   GLUCOSE 284* 10/10/2014 1145   BUN 30* 01/24/2015 1711   BUN 49* 10/10/2014 1145   BUN 37* 10/11/2013   CREATININE 2.65* 01/24/2015 1711   CREATININE 2.55* 10/14/2014 1702   CREATININE 2.63* 10/10/2014 1145   CALCIUM 8.0* 01/24/2015 1711   CALCIUM 8.6* 10/10/2014 1145   GFRNONAA 20* 01/24/2015  1711   GFRNONAA 21* 10/10/2014 1145   GFRAA 23* 01/24/2015 1711   GFRAA 24* 10/10/2014 1145    Lipid Panel     Component Value Date/Time   CHOL 145 12/28/2014 1009   TRIG 211.0* 12/28/2014 1009   HDL 37.90* 12/28/2014 1009  CHOLHDL 4 12/28/2014 1009   VLDL 42.2* 12/28/2014 1009   LDLCALC 66 10/02/2010 0859    CBC    Component Value Date/Time   WBC 8.4 01/24/2015 1711   WBC 7.5 10/10/2014 1145   RBC 4.04* 01/24/2015 1711   RBC 4.74 10/10/2014 1145   HGB 12.0* 01/24/2015 1711   HGB 13.5 10/10/2014 1145   HCT 35.8* 01/24/2015 1711   HCT 41.5 10/10/2014 1145   PLT 154 01/24/2015 1711   PLT 151 10/10/2014 1145   MCV 88.6 01/24/2015 1711   MCV 88 10/10/2014 1145   MCH 29.7 01/24/2015 1711   MCH 28.5 10/10/2014 1145   MCHC 33.5 01/24/2015 1711   MCHC 32.5 10/10/2014 1145   RDW 14.0 01/24/2015 1711   RDW 14.5 10/10/2014 1145   LYMPHSABS 1.4 08/18/2014 1611   MONOABS 0.6 08/18/2014 1611   EOSABS 0.4 08/18/2014 1611   BASOSABS 0.1 08/18/2014 1611    Hgb A1C Lab Results  Component Value Date   HGBA1C 9.3* 12/28/2014         Assessment & Plan:   Abdominal Pain secondary to Constipation:  Advised him to start Mirilax daily He also needs to make sure that he is drinking plenty of water He will hold off on taking the senna for now Abdominal pain improves with BM Will continue to monitor, return precautions given  RTC as needed or if symptoms persist or worsen

## 2015-03-08 ENCOUNTER — Other Ambulatory Visit: Payer: Self-pay | Admitting: Family Medicine

## 2015-03-22 DIAGNOSIS — E1151 Type 2 diabetes mellitus with diabetic peripheral angiopathy without gangrene: Secondary | ICD-10-CM | POA: Diagnosis not present

## 2015-03-22 DIAGNOSIS — L602 Onychogryphosis: Secondary | ICD-10-CM | POA: Diagnosis not present

## 2015-03-23 ENCOUNTER — Ambulatory Visit (INDEPENDENT_AMBULATORY_CARE_PROVIDER_SITE_OTHER): Payer: Medicare Other | Admitting: *Deleted

## 2015-03-23 ENCOUNTER — Telehealth: Payer: Self-pay | Admitting: Cardiology

## 2015-03-23 DIAGNOSIS — I255 Ischemic cardiomyopathy: Secondary | ICD-10-CM

## 2015-03-23 DIAGNOSIS — I5022 Chronic systolic (congestive) heart failure: Secondary | ICD-10-CM

## 2015-03-23 NOTE — Telephone Encounter (Signed)
LMOVM reminding pt to send remote transmission.   

## 2015-03-24 NOTE — Progress Notes (Signed)
Remote ICD transmission.   

## 2015-04-10 LAB — CUP PACEART REMOTE DEVICE CHECK
Battery Voltage: 2.65 V
Brady Statistic RA Percent Paced: 0.17 %
HIGH POWER IMPEDANCE MEASURED VALUE: 52 Ohm
HIGH POWER IMPEDANCE MEASURED VALUE: 69 Ohm
Implantable Lead Implant Date: 20111026
Implantable Lead Implant Date: 20111026
Implantable Lead Location: 753858
Implantable Lead Location: 753860
Implantable Lead Model: 4196
Implantable Lead Model: 6947
Lead Channel Impedance Value: 380 Ohm
Lead Channel Pacing Threshold Amplitude: 0.5 V
Lead Channel Pacing Threshold Amplitude: 1.125 V
Lead Channel Pacing Threshold Pulse Width: 0.4 ms
Lead Channel Pacing Threshold Pulse Width: 0.4 ms
Lead Channel Pacing Threshold Pulse Width: 1 ms
Lead Channel Sensing Intrinsic Amplitude: 2.375 mV
Lead Channel Sensing Intrinsic Amplitude: 8.25 mV
Lead Channel Sensing Intrinsic Amplitude: 8.25 mV
Lead Channel Setting Pacing Amplitude: 2 V
Lead Channel Setting Pacing Amplitude: 2.25 V
Lead Channel Setting Pacing Amplitude: 2.5 V
Lead Channel Setting Pacing Pulse Width: 0.4 ms
MDC IDC LEAD IMPLANT DT: 20111026
MDC IDC LEAD LOCATION: 753859
MDC IDC MSMT LEADCHNL LV IMPEDANCE VALUE: 380 Ohm
MDC IDC MSMT LEADCHNL LV IMPEDANCE VALUE: 627 Ohm
MDC IDC MSMT LEADCHNL LV IMPEDANCE VALUE: 836 Ohm
MDC IDC MSMT LEADCHNL RA IMPEDANCE VALUE: 589 Ohm
MDC IDC MSMT LEADCHNL RA PACING THRESHOLD AMPLITUDE: 1.5 V
MDC IDC MSMT LEADCHNL RA SENSING INTR AMPL: 2.375 mV
MDC IDC SESS DTM: 20161007043331
MDC IDC SET LEADCHNL LV PACING PULSEWIDTH: 1 ms
MDC IDC SET LEADCHNL RV SENSING SENSITIVITY: 0.3 mV
MDC IDC STAT BRADY AP VP PERCENT: 0.17 %
MDC IDC STAT BRADY AP VS PERCENT: 0 %
MDC IDC STAT BRADY AS VP PERCENT: 99.6 %
MDC IDC STAT BRADY AS VS PERCENT: 0.23 %
MDC IDC STAT BRADY RV PERCENT PACED: 99.77 %

## 2015-04-12 ENCOUNTER — Ambulatory Visit (INDEPENDENT_AMBULATORY_CARE_PROVIDER_SITE_OTHER): Payer: Medicare Other | Admitting: *Deleted

## 2015-04-12 DIAGNOSIS — Z95810 Presence of automatic (implantable) cardiac defibrillator: Secondary | ICD-10-CM

## 2015-04-12 DIAGNOSIS — I5022 Chronic systolic (congestive) heart failure: Secondary | ICD-10-CM

## 2015-04-12 NOTE — Progress Notes (Signed)
Pt presents to device clinic because he felt his ICD was protruding more than previously. Device pocket and incision without redness, edema or drainage. Pt denies fever and chills. No fluctuance over device. Pt advised to call device clinic for redness, swelling or drainage from incision. Keep remote and in-clinic follow up as scheduled. Pt agreeable.

## 2015-04-13 DIAGNOSIS — N189 Chronic kidney disease, unspecified: Secondary | ICD-10-CM | POA: Diagnosis not present

## 2015-04-13 DIAGNOSIS — N184 Chronic kidney disease, stage 4 (severe): Secondary | ICD-10-CM | POA: Diagnosis not present

## 2015-04-13 DIAGNOSIS — N2581 Secondary hyperparathyroidism of renal origin: Secondary | ICD-10-CM | POA: Diagnosis not present

## 2015-04-14 ENCOUNTER — Encounter: Payer: Self-pay | Admitting: Cardiology

## 2015-04-14 ENCOUNTER — Encounter: Payer: Self-pay | Admitting: Internal Medicine

## 2015-04-17 DIAGNOSIS — H25099 Other age-related incipient cataract, unspecified eye: Secondary | ICD-10-CM | POA: Diagnosis not present

## 2015-04-17 DIAGNOSIS — E109 Type 1 diabetes mellitus without complications: Secondary | ICD-10-CM | POA: Diagnosis not present

## 2015-04-17 LAB — HM DIABETES EYE EXAM

## 2015-04-20 DIAGNOSIS — N184 Chronic kidney disease, stage 4 (severe): Secondary | ICD-10-CM | POA: Diagnosis not present

## 2015-04-20 DIAGNOSIS — N2581 Secondary hyperparathyroidism of renal origin: Secondary | ICD-10-CM | POA: Diagnosis not present

## 2015-04-20 DIAGNOSIS — Z23 Encounter for immunization: Secondary | ICD-10-CM | POA: Diagnosis not present

## 2015-04-20 DIAGNOSIS — D631 Anemia in chronic kidney disease: Secondary | ICD-10-CM | POA: Diagnosis not present

## 2015-04-20 DIAGNOSIS — I10 Essential (primary) hypertension: Secondary | ICD-10-CM | POA: Diagnosis not present

## 2015-04-24 ENCOUNTER — Telehealth: Payer: Self-pay | Admitting: Family Medicine

## 2015-04-24 DIAGNOSIS — M542 Cervicalgia: Secondary | ICD-10-CM | POA: Diagnosis not present

## 2015-04-24 DIAGNOSIS — M25511 Pain in right shoulder: Secondary | ICD-10-CM | POA: Diagnosis not present

## 2015-04-24 NOTE — Telephone Encounter (Signed)
Call from ortho.  Patient at clinic, with shoulder pain.  Likely would benefit from injection.  The issue is DM2 control.  Agreed that if sugar <200 okay to proceed with low dose steroid injection with instruction to stick with DM2 diet and meds and update me as needed.  Ortho will check sugar before possible injection at clinic.   App ortho help.

## 2015-04-26 DIAGNOSIS — M25511 Pain in right shoulder: Secondary | ICD-10-CM | POA: Diagnosis not present

## 2015-04-26 DIAGNOSIS — M7541 Impingement syndrome of right shoulder: Secondary | ICD-10-CM | POA: Diagnosis not present

## 2015-04-27 DIAGNOSIS — M25511 Pain in right shoulder: Secondary | ICD-10-CM | POA: Diagnosis not present

## 2015-04-27 DIAGNOSIS — M7541 Impingement syndrome of right shoulder: Secondary | ICD-10-CM | POA: Diagnosis not present

## 2015-04-28 ENCOUNTER — Encounter: Payer: Self-pay | Admitting: Cardiology

## 2015-05-03 DIAGNOSIS — M25511 Pain in right shoulder: Secondary | ICD-10-CM | POA: Diagnosis not present

## 2015-05-03 DIAGNOSIS — M7541 Impingement syndrome of right shoulder: Secondary | ICD-10-CM | POA: Diagnosis not present

## 2015-05-04 DIAGNOSIS — M25511 Pain in right shoulder: Secondary | ICD-10-CM | POA: Diagnosis not present

## 2015-05-04 DIAGNOSIS — M7541 Impingement syndrome of right shoulder: Secondary | ICD-10-CM | POA: Diagnosis not present

## 2015-05-09 DIAGNOSIS — M7541 Impingement syndrome of right shoulder: Secondary | ICD-10-CM | POA: Diagnosis not present

## 2015-05-09 DIAGNOSIS — M25511 Pain in right shoulder: Secondary | ICD-10-CM | POA: Diagnosis not present

## 2015-05-16 DIAGNOSIS — M25511 Pain in right shoulder: Secondary | ICD-10-CM | POA: Diagnosis not present

## 2015-05-16 DIAGNOSIS — M7541 Impingement syndrome of right shoulder: Secondary | ICD-10-CM | POA: Diagnosis not present

## 2015-05-18 ENCOUNTER — Encounter: Payer: Self-pay | Admitting: Family Medicine

## 2015-05-18 DIAGNOSIS — M25511 Pain in right shoulder: Secondary | ICD-10-CM | POA: Diagnosis not present

## 2015-05-18 DIAGNOSIS — M7541 Impingement syndrome of right shoulder: Secondary | ICD-10-CM | POA: Diagnosis not present

## 2015-05-21 ENCOUNTER — Encounter (HOSPITAL_COMMUNITY): Payer: Self-pay | Admitting: Emergency Medicine

## 2015-05-21 ENCOUNTER — Inpatient Hospital Stay (HOSPITAL_COMMUNITY)
Admission: EM | Admit: 2015-05-21 | Discharge: 2015-05-23 | DRG: 194 | Disposition: A | Discharge status: Home / Self Care | Payer: Medicare Other | Attending: Internal Medicine | Admitting: Internal Medicine

## 2015-05-21 ENCOUNTER — Emergency Department (HOSPITAL_COMMUNITY): Payer: Medicare Other

## 2015-05-21 DIAGNOSIS — Z95810 Presence of automatic (implantable) cardiac defibrillator: Secondary | ICD-10-CM

## 2015-05-21 DIAGNOSIS — I129 Hypertensive chronic kidney disease with stage 1 through stage 4 chronic kidney disease, or unspecified chronic kidney disease: Secondary | ICD-10-CM | POA: Diagnosis not present

## 2015-05-21 DIAGNOSIS — I2510 Atherosclerotic heart disease of native coronary artery without angina pectoris: Secondary | ICD-10-CM | POA: Diagnosis present

## 2015-05-21 DIAGNOSIS — IMO0002 Reserved for concepts with insufficient information to code with codable children: Secondary | ICD-10-CM | POA: Diagnosis present

## 2015-05-21 DIAGNOSIS — Z951 Presence of aortocoronary bypass graft: Secondary | ICD-10-CM | POA: Diagnosis not present

## 2015-05-21 DIAGNOSIS — D649 Anemia, unspecified: Secondary | ICD-10-CM | POA: Diagnosis present

## 2015-05-21 DIAGNOSIS — Z794 Long term (current) use of insulin: Secondary | ICD-10-CM | POA: Diagnosis not present

## 2015-05-21 DIAGNOSIS — E1122 Type 2 diabetes mellitus with diabetic chronic kidney disease: Secondary | ICD-10-CM | POA: Diagnosis not present

## 2015-05-21 DIAGNOSIS — E291 Testicular hypofunction: Secondary | ICD-10-CM | POA: Diagnosis not present

## 2015-05-21 DIAGNOSIS — E1165 Type 2 diabetes mellitus with hyperglycemia: Secondary | ICD-10-CM | POA: Diagnosis not present

## 2015-05-21 DIAGNOSIS — Z905 Acquired absence of kidney: Secondary | ICD-10-CM

## 2015-05-21 DIAGNOSIS — Z85528 Personal history of other malignant neoplasm of kidney: Secondary | ICD-10-CM

## 2015-05-21 DIAGNOSIS — Z79899 Other long term (current) drug therapy: Secondary | ICD-10-CM

## 2015-05-21 DIAGNOSIS — Z888 Allergy status to other drugs, medicaments and biological substances status: Secondary | ICD-10-CM

## 2015-05-21 DIAGNOSIS — I5022 Chronic systolic (congestive) heart failure: Secondary | ICD-10-CM | POA: Diagnosis present

## 2015-05-21 DIAGNOSIS — I252 Old myocardial infarction: Secondary | ICD-10-CM | POA: Diagnosis not present

## 2015-05-21 DIAGNOSIS — E1121 Type 2 diabetes mellitus with diabetic nephropathy: Secondary | ICD-10-CM | POA: Diagnosis not present

## 2015-05-21 DIAGNOSIS — I10 Essential (primary) hypertension: Secondary | ICD-10-CM | POA: Diagnosis present

## 2015-05-21 DIAGNOSIS — N289 Disorder of kidney and ureter, unspecified: Secondary | ICD-10-CM

## 2015-05-21 DIAGNOSIS — R069 Unspecified abnormalities of breathing: Secondary | ICD-10-CM | POA: Diagnosis not present

## 2015-05-21 DIAGNOSIS — R531 Weakness: Secondary | ICD-10-CM

## 2015-05-21 DIAGNOSIS — Z7982 Long term (current) use of aspirin: Secondary | ICD-10-CM | POA: Diagnosis not present

## 2015-05-21 DIAGNOSIS — E1129 Type 2 diabetes mellitus with other diabetic kidney complication: Secondary | ICD-10-CM | POA: Diagnosis present

## 2015-05-21 DIAGNOSIS — J189 Pneumonia, unspecified organism: Secondary | ICD-10-CM | POA: Diagnosis not present

## 2015-05-21 DIAGNOSIS — N184 Chronic kidney disease, stage 4 (severe): Secondary | ICD-10-CM | POA: Diagnosis present

## 2015-05-21 DIAGNOSIS — Z87891 Personal history of nicotine dependence: Secondary | ICD-10-CM

## 2015-05-21 DIAGNOSIS — E785 Hyperlipidemia, unspecified: Secondary | ICD-10-CM | POA: Diagnosis present

## 2015-05-21 DIAGNOSIS — R52 Pain, unspecified: Secondary | ICD-10-CM

## 2015-05-21 DIAGNOSIS — I255 Ischemic cardiomyopathy: Secondary | ICD-10-CM | POA: Diagnosis present

## 2015-05-21 DIAGNOSIS — R0600 Dyspnea, unspecified: Secondary | ICD-10-CM | POA: Diagnosis not present

## 2015-05-21 DIAGNOSIS — R079 Chest pain, unspecified: Secondary | ICD-10-CM | POA: Diagnosis not present

## 2015-05-21 HISTORY — DX: Old myocardial infarction: I25.2

## 2015-05-21 LAB — I-STAT TROPONIN, ED: Troponin i, poc: 0.06 ng/mL (ref 0.00–0.08)

## 2015-05-21 LAB — CBC
HEMATOCRIT: 37.7 % — AB (ref 39.0–52.0)
HEMOGLOBIN: 12.3 g/dL — AB (ref 13.0–17.0)
MCH: 29 pg (ref 26.0–34.0)
MCHC: 32.6 g/dL (ref 30.0–36.0)
MCV: 88.9 fL (ref 78.0–100.0)
Platelets: 173 10*3/uL (ref 150–400)
RBC: 4.24 MIL/uL (ref 4.22–5.81)
RDW: 14.4 % (ref 11.5–15.5)
WBC: 10.1 10*3/uL (ref 4.0–10.5)

## 2015-05-21 LAB — HEPATIC FUNCTION PANEL
ALK PHOS: 81 U/L (ref 38–126)
ALT: 17 U/L (ref 17–63)
AST: 19 U/L (ref 15–41)
Albumin: 3 g/dL — ABNORMAL LOW (ref 3.5–5.0)
Total Bilirubin: 0.5 mg/dL (ref 0.3–1.2)
Total Protein: 5.8 g/dL — ABNORMAL LOW (ref 6.5–8.1)

## 2015-05-21 LAB — IRON AND TIBC
Iron: 32 ug/dL — ABNORMAL LOW (ref 45–182)
SATURATION RATIOS: 11 % — AB (ref 17.9–39.5)
TIBC: 280 ug/dL (ref 250–450)
UIBC: 248 ug/dL

## 2015-05-21 LAB — FOLATE: Folate: 13.8 ng/mL (ref >5.9)

## 2015-05-21 LAB — RETICULOCYTES
RBC.: 3.98 MIL/uL — AB (ref 4.22–5.81)
RETIC CT PCT: 1.4 % (ref 0.4–3.1)
Retic Count, Absolute: 55.7 10*3/uL (ref 19.0–186.0)

## 2015-05-21 LAB — URINALYSIS, ROUTINE W REFLEX MICROSCOPIC
Bilirubin Urine: NEGATIVE
Glucose, UA: 500 mg/dL — AB
HGB URINE DIPSTICK: NEGATIVE
Ketones, ur: NEGATIVE mg/dL
Nitrite: NEGATIVE
PH: 5 (ref 5.0–8.0)
Protein, ur: 30 mg/dL — AB
Specific Gravity, Urine: 1.016 (ref 1.005–1.030)

## 2015-05-21 LAB — BASIC METABOLIC PANEL
Anion gap: 9 (ref 5–15)
BUN: 36 mg/dL — AB (ref 6–20)
CHLORIDE: 107 mmol/L (ref 101–111)
CO2: 20 mmol/L — ABNORMAL LOW (ref 22–32)
Calcium: 8.6 mg/dL — ABNORMAL LOW (ref 8.9–10.3)
Creatinine, Ser: 2.47 mg/dL — ABNORMAL HIGH (ref 0.61–1.24)
GFR calc non Af Amer: 22 mL/min — ABNORMAL LOW (ref >60)
GFR, EST AFRICAN AMERICAN: 25 mL/min — AB (ref >60)
Glucose, Bld: 201 mg/dL — ABNORMAL HIGH (ref 65–99)
Potassium: 4.8 mmol/L (ref 3.5–5.1)
Sodium: 136 mmol/L (ref 135–145)

## 2015-05-21 LAB — URINE MICROSCOPIC-ADD ON

## 2015-05-21 LAB — GLUCOSE, CAPILLARY
GLUCOSE-CAPILLARY: 221 mg/dL — AB (ref 65–99)
GLUCOSE-CAPILLARY: 173 mg/dL — AB (ref 65–99)
Glucose-Capillary: 165 mg/dL — ABNORMAL HIGH (ref 65–99)
Glucose-Capillary: 190 mg/dL — ABNORMAL HIGH (ref 65–99)

## 2015-05-21 LAB — FERRITIN: FERRITIN: 69 ng/mL (ref 24–336)

## 2015-05-21 LAB — TROPONIN I
Troponin I: 0.04 ng/mL — ABNORMAL HIGH (ref <0.031)
Troponin I: 0.03 ng/mL (ref <0.031)

## 2015-05-21 LAB — VITAMIN B12: Vitamin B-12: 606 pg/mL (ref 180–914)

## 2015-05-21 MED ORDER — DEXTROSE 5 % IV SOLN
500.0000 mg | INTRAVENOUS | Status: DC
  Administered 2015-05-22: 500 mg via INTRAVENOUS
  Filled 2015-05-21 (×2): qty 500

## 2015-05-21 MED ORDER — CARVEDILOL 6.25 MG PO TABS
6.2500 mg | ORAL_TABLET | Freq: Two times a day (BID) | ORAL | Status: DC
  Administered 2015-05-21 – 2015-05-23 (×5): 6.25 mg via ORAL
  Filled 2015-05-21 (×5): qty 1

## 2015-05-21 MED ORDER — VITAMIN D 1000 UNITS PO TABS
2000.0000 [IU] | ORAL_TABLET | Freq: Every day | ORAL | Status: DC
  Administered 2015-05-21 – 2015-05-23 (×3): 2000 [IU] via ORAL
  Filled 2015-05-21 (×3): qty 2

## 2015-05-21 MED ORDER — ALUM & MAG HYDROXIDE-SIMETH 200-200-20 MG/5ML PO SUSP
30.0000 mL | Freq: Four times a day (QID) | ORAL | Status: DC | PRN
  Administered 2015-05-21: 30 mL via ORAL
  Filled 2015-05-21 (×2): qty 30

## 2015-05-21 MED ORDER — DEXTROSE 5 % IV SOLN
500.0000 mg | Freq: Once | INTRAVENOUS | Status: AC
  Administered 2015-05-21: 500 mg via INTRAVENOUS
  Filled 2015-05-21: qty 500

## 2015-05-21 MED ORDER — INSULIN ASPART 100 UNIT/ML ~~LOC~~ SOLN
0.0000 [IU] | Freq: Three times a day (TID) | SUBCUTANEOUS | Status: DC
  Administered 2015-05-21: 2 [IU] via SUBCUTANEOUS
  Administered 2015-05-21: 3 [IU] via SUBCUTANEOUS
  Administered 2015-05-21 – 2015-05-22 (×3): 2 [IU] via SUBCUTANEOUS

## 2015-05-21 MED ORDER — NITROGLYCERIN 0.4 MG SL SUBL
0.4000 mg | SUBLINGUAL_TABLET | SUBLINGUAL | Status: DC | PRN
  Filled 2015-05-21: qty 1

## 2015-05-21 MED ORDER — ONDANSETRON HCL 4 MG/2ML IJ SOLN
4.0000 mg | Freq: Four times a day (QID) | INTRAMUSCULAR | Status: DC | PRN
  Administered 2015-05-21: 4 mg via INTRAVENOUS
  Filled 2015-05-21: qty 2

## 2015-05-21 MED ORDER — ACETAMINOPHEN 650 MG RE SUPP: 650.0000 mg | Freq: Four times a day (QID) | RECTAL | Status: DC | PRN

## 2015-05-21 MED ORDER — ASPIRIN 81 MG PO CHEW
81.0000 mg | CHEWABLE_TABLET | Freq: Every day | ORAL | Status: DC
  Administered 2015-05-21 – 2015-05-22 (×2): 81 mg via ORAL
  Filled 2015-05-21 (×2): qty 1

## 2015-05-21 MED ORDER — SODIUM CHLORIDE 0.9 % IJ SOLN
3.0000 mL | Freq: Two times a day (BID) | INTRAMUSCULAR | Status: DC
  Administered 2015-05-21 – 2015-05-22 (×3): 3 mL via INTRAVENOUS

## 2015-05-21 MED ORDER — GLUCERNA SHAKE PO LIQD
237.0000 mL | Freq: Every day | ORAL | Status: DC
  Administered 2015-05-21 – 2015-05-23 (×2): 237 mL via ORAL

## 2015-05-21 MED ORDER — HEPARIN SODIUM (PORCINE) 5000 UNIT/ML IJ SOLN
5000.0000 [IU] | Freq: Three times a day (TID) | INTRAMUSCULAR | Status: DC
  Administered 2015-05-21 – 2015-05-23 (×7): 5000 [IU] via SUBCUTANEOUS
  Filled 2015-05-21 (×7): qty 1

## 2015-05-21 MED ORDER — DEXTROSE 5 % IV SOLN
1.0000 g | Freq: Once | INTRAVENOUS | Status: AC
  Administered 2015-05-21: 1 g via INTRAVENOUS
  Filled 2015-05-21 (×2): qty 10

## 2015-05-21 MED ORDER — SODIUM CHLORIDE 0.9 % IV SOLN
INTRAVENOUS | Status: DC
  Administered 2015-05-21: 07:00:00 via INTRAVENOUS

## 2015-05-21 MED ORDER — INSULIN ASPART 100 UNIT/ML ~~LOC~~ SOLN
0.0000 [IU] | Freq: Every day | SUBCUTANEOUS | Status: DC
  Administered 2015-05-22: 2 [IU] via SUBCUTANEOUS

## 2015-05-21 MED ORDER — INSULIN GLARGINE 100 UNIT/ML ~~LOC~~ SOLN
30.0000 [IU] | Freq: Every day | SUBCUTANEOUS | Status: DC
  Administered 2015-05-21 – 2015-05-22 (×2): 30 [IU] via SUBCUTANEOUS
  Filled 2015-05-21 (×5): qty 0.3

## 2015-05-21 MED ORDER — PRAVASTATIN SODIUM 40 MG PO TABS
40.0000 mg | ORAL_TABLET | Freq: Every day | ORAL | Status: DC
  Administered 2015-05-21 – 2015-05-22 (×2): 40 mg via ORAL
  Filled 2015-05-21 (×2): qty 1

## 2015-05-21 MED ORDER — ALBUTEROL SULFATE (2.5 MG/3ML) 0.083% IN NEBU: 2.5000 mg | INHALATION_SOLUTION | Freq: Four times a day (QID) | RESPIRATORY_TRACT | Status: DC | PRN

## 2015-05-21 MED ORDER — ACETAMINOPHEN 325 MG PO TABS
650.0000 mg | ORAL_TABLET | Freq: Four times a day (QID) | ORAL | Status: DC | PRN
  Administered 2015-05-22: 650 mg via ORAL
  Filled 2015-05-21: qty 2

## 2015-05-21 MED ORDER — ONDANSETRON HCL 4 MG PO TABS: 4.0000 mg | ORAL_TABLET | Freq: Four times a day (QID) | ORAL | Status: DC | PRN

## 2015-05-21 MED ORDER — SODIUM CHLORIDE 0.9 % IV BOLUS (SEPSIS)
500.0000 mL | Freq: Once | INTRAVENOUS | Status: AC
  Administered 2015-05-21: 500 mL via INTRAVENOUS

## 2015-05-21 MED ORDER — DEXTROSE 5 % IV SOLN
1.0000 g | INTRAVENOUS | Status: DC
  Administered 2015-05-22: 1 g via INTRAVENOUS
  Filled 2015-05-21 (×2): qty 10

## 2015-05-21 MED ORDER — OXYCODONE HCL 5 MG PO TABS: 5.0000 mg | ORAL_TABLET | ORAL | Status: DC | PRN

## 2015-05-21 MED ORDER — HYDROMORPHONE HCL 1 MG/ML IJ SOLN: 0.5000 mg | INTRAMUSCULAR | Status: DC | PRN

## 2015-05-21 NOTE — ED Notes (Signed)
Pt awoke at 115 feeling funny, weak, and SOB; sharp pain at substernal area. PT increasingly becoming weaker and more anxious. Pt called FD himself. 4mg  of zofran. 324 ASA and 2 NTG. 40 point drop in systolic presser. Pain got better after te 2nd NTG  Previous MI hx: Diabetes HTN  Demand Ventricular-paced

## 2015-05-21 NOTE — H&P (Signed)
[facility] Admission History and Physical       Homar Loudy [id number] DOB: 1925-06-26 DOA: 05/21/2015  Referring physician: EDP PCP: Elsie Stain, MD  Specialists:   Chief Complaint: Weakness  HPI: [name] is a 81 y.o. male with a history of CAD, Systolic CHF, HTN, DM2, Hyperlipidemia, and CRI ( S/P Left partial Nephrectomy due to RCCa in 2010) who presents to the ED with complaints of increased weakness and malaise x 1 week.   He has had a nonproductive cough.  He denies any fevers or chills.   He was evaluated in the ED and found to have a LLL pneumonia.  He was placed on IV antibiotics for CAP pneumonia and referred for admission.      Review of Systems:  Constitutional: No Weight Loss, No Weight Gain, Night Sweats, Fevers, Chills, Dizziness, Light Headedness, Fatigue, or Generalized Weakness HEENT: No Headaches, Difficulty Swallowing,Tooth/Dental Problems,Sore Throat,  No Sneezing, Rhinitis, Ear Ache, Nasal Congestion, or Post Nasal Drip,  Cardio-vascular:  No Chest pain, Orthopnea, PND, Edema in Lower Extremities, Anasarca, Dizziness, Palpitations  Resp: No Dyspnea, No DOE, No Productive Cough, No Non-Productive Cough, No Hemoptysis, No Wheezing.    GI: No Heartburn, Indigestion, Abdominal Pain, Nausea, Vomiting, Diarrhea, Constipation, Hematemesis, Hematochezia, Melena, Change in Bowel Habits,  Loss of Appetite  GU: No Dysuria, No Change in Color of Urine, No Urgency or Urinary Frequency, No Flank pain.  Musculoskeletal: No Joint Pain or Swelling, No Decreased Range of Motion, No Back Pain.  Neurologic: No Syncope, No Seizures, Muscle Weakness, Paresthesia, Vision Disturbance or Loss, No Diplopia, No Vertigo, No Difficulty Walking,  Skin: No Rash or Lesions. Psych: No Change in Mood or Affect, No Depression or Anxiety, No Memory loss, No Confusion, or Hallucinations   Past Medical History  Diagnosis Date  . HTN (hypertension) (06/17/1981)  .  Diverticula of colon (06/18/1983)  . Renal insufficiency   . Hyperlipidemia 06/17/1993)  . Diabetes mellitus  (06/17/1997)  . PUD (peptic ulcer disease) 1960's  . Pancreatitis 01/24 - 07/13/08    ABD CT  fatty liver early pancreatitis  07/12/2008  Pelvic CT Nml 07/12/2008  . Cardiomyopathy     EF 25% Ant Akinesis Global Hypo 3/2-08/18/08  . Thyroid nodule     per Dr. Brantley Stage  . Renal disease     per Dr. Posey Pronto  . Hypogonadism male   . GIB (gastrointestinal bleeding)     admission 07/2014, likely diverticular bleeding  . Arthritis   . Lower GI bleed 08/08/2014  . History of MI (myocardial infarction)     January 2010     Past Surgical History  Procedure Laterality Date  . Colonoscopy  12/2007    4 mm sigmoid HP polyp, diverticulosis. random biopsies negative for colitis.   . Cardiac catheterization       Severe 3-vessel coronary artery disease.  Ischemic   cardiomyopathy.   . Nephrectomy  01/09/09    Partial left nephrectomy. Clear Cell Renal Cell Carcinoma 01/09/2009  . Cardiac defibrillator placement      ICD-Medtronic .  [id number]  . Laparotomy       small mid 80's (laparotomy), 09/12/2000 (no surgery), 10/20/2008 (no surgery)  . Lesion removal      RLE 11/14/2006 (Dr Hulen Skains)   . Ct of abdomen  06/1997    Fatty process unchanged - myelolipoma adrenal unchangd  . Ct of abdomen  02/99    Adrenal mass unchanged, no further follow up required  . Sbo  08/2000 no surgery   10/20/08 no surgery    Mid 80's laparotomy  . Esophagogastroduodenoscopy  07/12/2008    Schatzki Ring 2 cm. HH (Dr. Deatra Ina)  . Abdominal US  07/12/08    Fatty infiltrate liver complex cyst in lower pole left kidney  . Icd/ crt (lv lead disabled)      Medtronic [id number]  . Coronary artery bypass graft  09/05/08     Mediastinotomy, extracorporeal circulation,   coronary artery bypass graft surgery x4 using a left internal mammary  artery graft to the left anterior descending coronary artery, with a  saphenous vein  graft to the first diagonal branch of the LAD, a   saphenous vein graft to the obtuse marginal branch of the left   circumflex coronary artery,  Dr. Cyndia Bent  . Cholecystectomy  1970's  . Esophagogastroduodenoscopy N/A 08/09/2014    Procedure: ESOPHAGOGASTRODUODENOSCOPY (EGD);  Surgeon: Ladene Artist, MD;  [facility] ENDOSCOPY;  Service: Endoscopy;  Laterality: N/A;  . Colonoscopy with propofol N/A 08/10/2014    Procedure: COLONOSCOPY WITH PROPOFOL;  Surgeon: Ladene Artist, MD;  Location: [facility] ENDOSCOPY;  Service: Endoscopy;  Laterality: N/A;      Prior to Admission medications   Medication Sig Start Date End Date Taking? Authorizing Provider  aspirin 81 MG tablet Take 81 mg by mouth at bedtime.    Yes Historical Provider, MD  carvedilol (COREG) 6.25 MG tablet Take by mouth. 6.25mg  twice a day   Yes Historical Provider, MD  Cholecalciferol (VITAMIN D) 2000 UNITS tablet Take 2,000 Units by mouth daily.   Yes Historical Provider, MD  Insulin Glargine (LANTUS) 100 UNIT/ML Solostar Pen Inject 30 Units into the skin daily at 10 pm.    Yes Historical Provider, MD  lisinopril (PRINIVIL,ZESTRIL) 2.5 MG tablet TAKE 1 TABLET BY MOUTH DAILY 03/08/15  Yes Tonia Ghent, MD  nitroGLYCERIN (NITROSTAT) 0.4 MG SL tablet Place 1 tablet (0.4 mg total) under the tongue every 5 (five) minutes as needed. 08/05/14  Yes Tonia Ghent, MD  Nutritional Supplements (GLUCERNA SHAKE PO) Take 4 oz by mouth daily after breakfast.     Yes Historical Provider, MD  pravastatin (PRAVACHOL) 40 MG tablet TAKE 1 TABLET BY MOUTH AT BEDTIME 01/23/15  Yes Tonia Ghent, MD  testosterone cypionate (DEPOTESTOTERONE CYPIONATE) 200 MG/ML injection INJECT 1.25ML INTO THE MUSCLE EVERY 28 DAYS 06/30/14  Yes Tonia Ghent, MD     Allergies  Allergen Reactions  . Celecoxib     REACTION: myalgia: abdominal pain  . Kayexalate [Polystyrene] Other (See Comments)    constipation  . Nsaids     REACTION: renal disease  . Rosuvastatin      REACTION: myalgia: musle pain    Social History:  reports that he quit smoking about 76 years ago. He has never used smokeless tobacco. He reports that he drinks alcohol. He reports that he does not use illicit drugs.    Family History  Problem Relation Age of Onset  . Angina Father   . Hypertension Father   . Heart disease Father     MI, angina  . Breast cancer Mother   . Heart disease Mother     Valve disease  . Cancer Brother     Brain tumor  . Colon cancer Neg Hx   . Prostate cancer Neg Hx   . Colon cancer Neg Hx   . Colon polyps Neg Hx   . Kidney disease Neg Hx   . Esophageal cancer  Neg Hx        Physical Exam:  GEN:  Pleasant Elderly Obese 81 y.o. Caucasian male examined and in no acute distress; cooperative with exam Filed Vitals:   05/21/15 0300 05/21/15 0302 05/21/15 0315 05/21/15 0330  BP: 142/82 142/82 120/78 129/61  Pulse: 71 71 70 72  Temp: 97.7 F (36.5 C) 97.7 F (36.5 C)    TempSrc: Oral     Resp: 23 17 19 22   Height: 6\' 1"  (1.854 m)     Weight: 95.709 kg (211 lb)     SpO2: 100% 100% 100% 98%   Blood pressure 129/61, pulse 72, temperature 97.7 F (36.5 C), temperature source Oral, resp. rate 22, height 6\' 1"  (1.854 m), weight 95.709 kg (211 lb), SpO2 98 %. PSYCH: He is alert and oriented x4; does not appear anxious does not appear depressed; affect is normal HEENT: Normocephalic and Atraumatic, Mucous membranes pink; PERRLA; EOM intact; Fundi:  Benign;  No scleral icterus, Nares: Patent, Oropharynx: Clear,  Fair Dentition,    Neck:  FROM, No Cervical Lymphadenopathy nor Thyromegaly or Carotid Bruit; No JVD; Breasts:: Not examined CHEST WALL: No tenderness CHEST: Normal respiration, clear to auscultation bilaterally HEART: Regular rate and rhythm; no murmurs rubs or gallops BACK: No kyphosis or scoliosis; No CVA tenderness ABDOMEN: Positive Bowel Sounds, Obese, Soft Non-Tender, No Rebound or Guarding; No Masses, No Organomegaly, No Pannus; No  Intertriginous candida. Rectal Exam: Not done EXTREMITIES: No Cyanosis, Clubbing, or Edema; No Ulcerations. Genitalia: not examined PULSES: 2+ and symmetric SKIN: Normal hydration no rash or ulceration CNS:  Alert and Oriented x 4, No Focal Deficits Vascular: pulses palpable throughout    Labs on Admission:  Basic Metabolic Panel:  Recent Labs Lab 05/21/15 0307  NA 136  K 4.8  CL 107  CO2 20*  GLUCOSE 201*  BUN 36*  CREATININE 2.47*  CALCIUM 8.6*   Liver Function Tests:  Recent Labs Lab 05/21/15 0307  AST 19  ALT 17  ALKPHOS 81  BILITOT 0.5  PROT 5.8*  ALBUMIN 3.0*   No results for input(s): LIPASE, AMYLASE in the last 168 hours. No results for input(s): AMMONIA in the last 168 hours. CBC:  Recent Labs Lab 05/21/15 0307  WBC 10.1  HGB 12.3*  HCT 37.7*  MCV 88.9  PLT 173   Cardiac Enzymes:  Recent Labs Lab 05/21/15 0307  TROPONINI 0.04*    BNP (last 3 results) No results for input(s): BNP in the last 8760 hours.  ProBNP (last 3 results) No results for input(s): PROBNP in the last 8760 hours.  CBG: No results for input(s): GLUCAP in the last 168 hours.  Radiological Exams on Admission: Dg Chest 2 View  05/21/2015  CLINICAL DATA:  Awakened at 1:15 feeling weak and dyspnea. Sharp substernal pain. EXAM: CHEST  2 VIEW COMPARISON:  03/07/2014 CT.  02/10/2012 radiographs. FINDINGS: Opacity in the lateral left base may represent early infiltrate. The right lung is clear. Pulmonary vasculature is normal. There is unchanged moderate cardiomegaly. There are intact appearances of the transvenous cardiac leads. IMPRESSION: Lateral left base opacity, possibly early infiltrate or atelectasis. Electronically Signed   By: Andreas Newport M.D.   On: 05/21/2015 04:47   Dg Abd 2 Views  05/21/2015  CLINICAL DATA:  Awakened at 01:15, feeling weakness and dyspnea. Sharp substernal pain. EXAM: ABDOMEN - 2 VIEW COMPARISON:  None. FINDINGS: The bowel gas pattern is  normal. There is no evidence of free air. No radio-opaque calculi or other  significant radiographic abnormality is seen. IMPRESSION: Negative. Electronically Signed   By: Andreas Newport M.D.   On: 05/21/2015 04:48     EKG: Independently reviewed. Ventricular Paced at rate of 71         Assessment/Plan:      81 y.o. male with  Principal Problem:   1.      CAP (community acquired pneumonia)   IV Rocephin and Azithromycin   Albuterol Nebs   O2 PRN   Active Problems:   2.     CAD (coronary artery disease)   Continue Carvedilol, ASA, and Pravastatin Rx   Cardiac Monitoring      3.     DM (diabetes mellitus), type 2, uncontrolled, with renal complications (HCC)   Continue Lantus Insulin   SSI coverage PRN   Check HbA1C in AM      4.     HTN (hypertension)   Continue Carvedilol, Lisinopril on Hold due to #6   Monitor BPs     5.     Chronic systolic congestive heart failure, NYHA class 3 (HCC)   Monitor I/Os and prevent Fluid Overload     6.     Renal insufficiency   Monitor BUN/Cr   Hold Ace Inhibitor Rx     7.     Anemia   Monitor Trend of Hb   Send Anemia Panel and FOBT     8.     Weakness- due to #1   Has PT as outpatient     9.     DVT Prophylaxis   SQ Heparin   Code Status:     FULL CODE Family Communication:   Wife and Daughter at Bedside    Disposition Plan:    Inpatient Status        Time spent:  107 Minutes      Theressa Millard [facility] Pager [phone]   If [place] Please Contact the Day Rounding Team MD for [facility]  If 7PM-7AM, Please Contact Night-Floor Coverage  [email or web address] Password [email or web address] 05/21/2015, 5:16 AM     ADDENDUM:   Patient was seen and examined on 05/21/2015

## 2015-05-21 NOTE — ED Notes (Signed)
Patient has been moved off the unit to receiving floor.

## 2015-05-21 NOTE — Progress Notes (Signed)
Attempted report. Left phone number for ED RN to call back.   Shelbie Hutching, RN, BSN

## 2015-05-21 NOTE — Evaluation (Signed)
Physical Therapy Evaluation Patient Details Name: Yuniel Sawhney MRN: [id number] DOB: 05-20-1926 Today's Date: 05/21/2015   History of Present Illness  HPI: Cutter Crusan is a 81 y.o. male with a history of CAD, Systolic CHF, HTN, DM2, Hyperlipidemia, and CRI ( S/P Left partial Nephrectomy due to RCCa in 2010) who presents to the ED with complaints of increased weakness and malaise x 1 week. He has had a nonproductive cough. He denies any fevers or chills. He was evaluated in the ED and found to have a LLL pneumonia. He was placed on IV antibiotics for CAP pneumonia  Clinical Impression   Pt admitted with above diagnosis. Pt currently with functional limitations due to the deficits listed below (see PT Problem List).  Pt will benefit from skilled PT to increase their independence and safety with mobility to allow discharge to the venue listed below.       Follow Up Recommendations Supervision - Intermittent;Outpatient PT (Mr. Hartman fits the clinical picture at this point for HHPT follow up, but he has seen [place] work with his wife in the past and he would rather not have it; He is going to Outpt PT for shoulder and neck pain -- it sounds like he would like to continue with them, [employer], and ask them to address his generalized weakness as well)    Equipment Recommendations  Rolling walker with 5" wheels;3in1 (PT)    Recommendations for Other Services OT consult     Precautions / Restrictions Precautions Precautions: Fall      Mobility  Bed Mobility Overal bed mobility: Needs Assistance Bed Mobility: Supine to Sit     Supine to sit: Min guard     General bed mobility comments: Slow moving and used rails, but did not require physical assist  Transfers Overall transfer level: Needs assistance Equipment used: Rolling walker (2 wheeled) Transfers: Sit to/from Stand Sit to Stand: Min assist         General transfer comment: Min assist to power  up  Ambulation/Gait Ambulation/Gait assistance: Min guard Ambulation Distance (Feet): 20 Feet Assistive device: Rolling walker (2 wheeled) Gait Pattern/deviations: Step-through pattern;Decreased stride length;Trunk flexed Gait velocity: quite slow   General Gait Details: Cues to self-monitor for activity tolerance; very tired from lack of sleep and fatigued from general malaise  Stairs            Wheelchair Mobility    Modified Rankin (Stroke Patients Only)       Balance Overall balance assessment: Needs assistance   Sitting balance-Leahy Scale: Good       Standing balance-Leahy Scale: Poor (approaching Fair)                               Pertinent Vitals/Pain Pain Assessment: No/denies pain    Home Living Family/patient expects to be discharged to:: Private residence Living Arrangements: Spouse/significant other Available Help at Discharge: Family;Available PRN/intermittently (close to 24 hour assist) Type of Home: House Home Access: Stairs to enter Entrance Stairs-Rails: Right Entrance Stairs-Number of Steps: 3 Home Layout: Two level;Laundry or work area in basement;Able to live on main level with bedroom/bathroom Home Equipment: Kasandra Knudsen - single point;Walker - 2 wheels (would need to verify RW)      Prior Function Level of Independence: Independent with assistive device(s)         Comments: cane prn     Hand Dominance        Extremity/Trunk Assessment  Upper Extremity Assessment: Generalized weakness           Lower Extremity Assessment: Generalized weakness         Communication   Communication: No difficulties  Cognition Arousal/Alertness: Awake/alert Behavior During Therapy: WFL for tasks assessed/performed Overall Cognitive Status: Within Functional Limits for tasks assessed                      General Comments General comments (skin integrity, edema, etc.): Session conducted on Room Air and O2 sats  remained greater than or equal to 95%    Exercises        Assessment/Plan    PT Assessment Patient needs continued PT services  PT Diagnosis Difficulty walking;Generalized weakness   PT Problem List Decreased strength;Decreased activity tolerance;Decreased balance;Decreased mobility;Decreased knowledge of use of DME;Decreased safety awareness;Decreased knowledge of precautions;Cardiopulmonary status limiting activity  PT Treatment Interventions DME instruction;Gait training;Stair training;Functional mobility training;Therapeutic activities;Therapeutic exercise;Patient/family education   PT Goals (Current goals can be found in the Care Plan section) Acute Rehab PT Goals Patient Stated Goal: wants to get rest and feel better PT Goal Formulation: With patient Time For Goal Achievement: 06/04/15 Potential to Achieve Goals: Good    Frequency Min 3X/week   Barriers to discharge        Co-evaluation               End of Session Equipment Utilized During Treatment: Gait belt Activity Tolerance: Patient limited by fatigue Patient left: in bed;with call bell/phone within reach;with family/visitor present Nurse Communication: Mobility status         Time: [id number] PT Time Calculation (min) (ACUTE ONLY): 27 min   Charges:   PT Evaluation $Initial PT Evaluation Tier I: 1 Procedure PT Treatments $Gait Training: 8-22 mins   PT G Codes:        Quin Hoop 05/21/2015, 3:39 PM  Roney [place], [place] Pager [phone] Office [phone]

## 2015-05-21 NOTE — Progress Notes (Signed)
Utilization Review Completed.Tige Meas T12/09/2014  

## 2015-05-21 NOTE — ED Provider Notes (Signed)
CSN: [id number]     Arrival date & time 05/21/15  0252 History  By signing my name below, I, Jolayne Panther, attest that this documentation has been prepared under the direction and in the presence of Jola Schmidt, MD. Electronically Signed: Jolayne Panther, Scribe. 05/21/2015. 3:59 AM.    Chief Complaint  Patient presents with  . Chest Pain    The history is provided by the patient, the spouse and a relative. No language interpreter was used.   HPI Comments: [name] is a 81 y.o. male with a h/o DM, pace maker, partial nephrectomy, CABG, and cholecsystectomy who presents to the Emergency Department complaining of mild, generalized weakness, chest pain, and difficulty ambulating due to mild loss of muscle control onset 1:15 AM this morning. Pt also notes a brief episode of shakes/tremors in his lower extremities bilaterally yesterday. It was also noted by the pt's spouse and daughter that his speech has been weak. His wife notes erythema of his buttocks onset 4 weeks ago. Pt is frequently constipated. He takes miralax and drinks prune juice for relief of his constipation. He denies cough, SOB, rash, mucous, dysuria, frequency of urination, difficulty of urination, fever, change in appetite, and recent change in medications taken.   Past Medical History  Diagnosis Date  . HTN (hypertension) (06/17/1981)  . Diverticula of colon (06/18/1983)  . Renal insufficiency   . Hyperlipidemia 06/17/1993)  . Diabetes mellitus  (06/17/1997)  . PUD (peptic ulcer disease) 1960's  . Pancreatitis 01/24 - 07/13/08    ABD CT  fatty liver early pancreatitis  07/12/2008  Pelvic CT Nml 07/12/2008  . Cardiomyopathy     EF 25% Ant Akinesis Global Hypo 3/2-08/18/08  . Thyroid nodule     per Dr. Brantley Stage  . Renal disease     per Dr. Posey Pronto  . Hypogonadism male   . GIB (gastrointestinal bleeding)     admission 07/2014, likely diverticular bleeding  . Arthritis   . Lower GI bleed 08/08/2014  .  History of MI (myocardial infarction)     January 2010   Past Surgical History  Procedure Laterality Date  . Colonoscopy  12/2007    4 mm sigmoid HP polyp, diverticulosis. random biopsies negative for colitis.   . Cardiac catheterization       Severe 3-vessel coronary artery disease.  Ischemic   cardiomyopathy.   . Nephrectomy  01/09/09    Partial left nephrectomy. Clear Cell Renal Cell Carcinoma 01/09/2009  . Cardiac defibrillator placement      ICD-Medtronic .  [id number]  . Laparotomy       small mid 80's (laparotomy), 09/12/2000 (no surgery), 10/20/2008 (no surgery)  . Lesion removal      RLE 11/14/2006 (Dr Hulen Skains)   . Ct of abdomen  06/1997    Fatty process unchanged - myelolipoma adrenal unchangd  . Ct of abdomen  02/99    Adrenal mass unchanged, no further follow up required  . Sbo  08/2000 no surgery   10/20/08 no surgery    Mid 80's laparotomy  . Esophagogastroduodenoscopy  07/12/2008    Schatzki Ring 2 cm. HH (Dr. Deatra Ina)  . Abdominal US  07/12/08    Fatty infiltrate liver complex cyst in lower pole left kidney  . Icd/ crt (lv lead disabled)      Medtronic [id number]  . Coronary artery bypass graft  09/05/08     Mediastinotomy, extracorporeal circulation,   coronary artery bypass graft surgery x4 using a left internal mammary  artery graft to the left anterior descending coronary artery, with a  saphenous vein graft to the first diagonal branch of the LAD, a   saphenous vein graft to the obtuse marginal branch of the left   circumflex coronary artery,  Dr. Cyndia Bent  . Cholecystectomy  1970's  . Esophagogastroduodenoscopy N/A 08/09/2014    Procedure: ESOPHAGOGASTRODUODENOSCOPY (EGD);  Surgeon: Ladene Artist, MD;  Location: [facility] ENDOSCOPY;  Service: Endoscopy;  Laterality: N/A;  . Colonoscopy with propofol N/A 08/10/2014    Procedure: COLONOSCOPY WITH PROPOFOL;  Surgeon: Ladene Artist, MD;  Location: [facility] ENDOSCOPY;  Service: Endoscopy;  Laterality: N/A;   Family History  Problem  Relation Age of Onset  . Angina Father   . Hypertension Father   . Heart disease Father     MI, angina  . Breast cancer Mother   . Heart disease Mother     Valve disease  . Cancer Brother     Brain tumor  . Colon cancer Neg Hx   . Prostate cancer Neg Hx   . Colon cancer Neg Hx   . Colon polyps Neg Hx   . Kidney disease Neg Hx   . Esophageal cancer Neg Hx    Social History  Substance Use Topics  . Smoking status: Former Smoker    Quit date: 06/17/1938  . Smokeless tobacco: Never Used  . Alcohol Use: 0.0 oz/week    0 Standard drinks or equivalent per week     Comment: Rare    Review of Systems A complete 10 system review of systems was obtained and all systems are negative except as noted in the HPI and PMH.   Allergies  Celecoxib; Kayexalate; Nsaids; and Rosuvastatin  Home Medications   Prior to Admission medications   Medication Sig Start Date End Date Taking? Authorizing Provider  aspirin 81 MG tablet Take 81 mg by mouth daily.    Historical Provider, MD  carvedilol (COREG) 6.25 MG tablet Take by mouth. 6.25mg  twice a day    Historical Provider, MD  Cholecalciferol (VITAMIN D) 2000 UNITS tablet Take 2,000 Units by mouth daily.    Historical Provider, MD  Insulin Glargine (LANTUS) 100 UNIT/ML Solostar Pen Inject 28 Units into the skin daily.    Historical Provider, MD  KIONEX 15 GM/60ML suspension TAKE 180 ML (45GRAMS TOTAL) AS ONE TIME DOSE 01/18/15   Historical Provider, MD  lisinopril (PRINIVIL,ZESTRIL) 2.5 MG tablet TAKE 1 TABLET BY MOUTH DAILY 03/08/15   Tonia Ghent, MD  nitroGLYCERIN (NITROSTAT) 0.4 MG SL tablet Place 1 tablet (0.4 mg total) under the tongue every 5 (five) minutes as needed. 08/05/14   Tonia Ghent, MD  Nutritional Supplements (GLUCERNA SHAKE PO) Take 4 oz by mouth daily after breakfast.      Historical Provider, MD  pravastatin (PRAVACHOL) 40 MG tablet TAKE 1 TABLET BY MOUTH AT BEDTIME 01/23/15   Tonia Ghent, MD  testosterone cypionate  (DEPOTESTOTERONE CYPIONATE) 200 MG/ML injection INJECT 1.25ML INTO THE MUSCLE EVERY 28 DAYS 06/30/14   Tonia Ghent, MD   BP 142/82 mmHg  Pulse 71  Temp(Src) 97.7 F (36.5 C) (Oral)  Resp 17  Ht 6\' 1"  (1.854 m)  Wt 211 lb (95.709 kg)  BMI 27.84 kg/m2  SpO2 100% Physical Exam  Constitutional: He is oriented to person, place, and time. He appears well-developed and well-nourished.  HENT:  Head: Normocephalic and atraumatic.  Eyes: EOM are normal.  Neck: Normal range of motion.  Cardiovascular: Normal rate, regular  rhythm and normal heart sounds.   Pulmonary/Chest: Effort normal and breath sounds normal. No respiratory distress.  Abdominal: Soft. He exhibits no distension. There is no tenderness.  Mild generalized abdominal tenderness without guarding or rebound.   Musculoskeletal: Normal range of motion.  Neurological: He is alert and oriented to person, place, and time.  Skin: Skin is warm and dry.  Psychiatric: He has a normal mood and affect. Judgment normal.  Nursing note and vitals reviewed.   ED Course  Procedures  DIAGNOSTIC STUDIES:    Oxygen Saturation is 100% on 10L/min, normal by my interpretation.   COORDINATION OF CARE:  3:25 AM Will order x-ray of chest and abdomen. Will order lab work. Will administer pt fluids in the ED. Discussed treatment plan with pt at bedside and pt agreed to plan.   Labs Review Labs Reviewed  BASIC METABOLIC PANEL - Abnormal; Notable for the following:    CO2 20 (*)    Glucose, Bld 201 (*)    BUN 36 (*)    Creatinine, Ser 2.47 (*)    Calcium 8.6 (*)    GFR calc non Af Amer 22 (*)    GFR calc Af Amer 25 (*)    All other components within normal limits  CBC - Abnormal; Notable for the following:    Hemoglobin 12.3 (*)    HCT 37.7 (*)    All other components within normal limits  URINALYSIS, ROUTINE W REFLEX MICROSCOPIC (NOT AT [facility]) - Abnormal; Notable for the following:    Glucose, UA 500 (*)    Protein, ur 30 (*)     Leukocytes, UA SMALL (*)    All other components within normal limits  HEPATIC FUNCTION PANEL - Abnormal; Notable for the following:    Total Protein 5.8 (*)    Albumin 3.0 (*)    Bilirubin, Direct <0.1 (*)    All other components within normal limits  TROPONIN I - Abnormal; Notable for the following:    Troponin I 0.04 (*)    All other components within normal limits  URINE MICROSCOPIC-ADD ON - Abnormal; Notable for the following:    Squamous Epithelial / LPF 0-5 (*)    Bacteria, UA RARE (*)    All other components within normal limits  IRON AND TIBC - Abnormal; Notable for the following:    Iron 32 (*)    Saturation Ratios 11 (*)    All other components within normal limits  RETICULOCYTES - Abnormal; Notable for the following:    RBC. 3.98 (*)    All other components within normal limits  GLUCOSE, CAPILLARY - Abnormal; Notable for the following:    Glucose-Capillary 165 (*)    All other components within normal limits  GLUCOSE, CAPILLARY - Abnormal; Notable for the following:    Glucose-Capillary 190 (*)    All other components within normal limits  GLUCOSE, CAPILLARY - Abnormal; Notable for the following:    Glucose-Capillary 221 (*)    All other components within normal limits  CBC - Abnormal; Notable for the following:    RBC 4.17 (*)    Hemoglobin 12.3 (*)    HCT 37.1 (*)    All other components within normal limits  GLUCOSE, CAPILLARY - Abnormal; Notable for the following:    Glucose-Capillary 173 (*)    All other components within normal limits  GLUCOSE, CAPILLARY - Abnormal; Notable for the following:    Glucose-Capillary 144 (*)    All other components within normal limits  VITAMIN B12  FOLATE  FERRITIN  TROPONIN I  STREP PNEUMONIAE URINARY ANTIGEN  BASIC METABOLIC PANEL  HEMOGLOBIN A1C  I-STAT TROPOININ, ED    Imaging Review Dg Chest 2 View  05/21/2015  CLINICAL DATA:  Awakened at 1:15 feeling weak and dyspnea. Sharp substernal pain. EXAM: CHEST  2 VIEW  COMPARISON:  03/07/2014 CT.  02/10/2012 radiographs. FINDINGS: Opacity in the lateral left base may represent early infiltrate. The right lung is clear. Pulmonary vasculature is normal. There is unchanged moderate cardiomegaly. There are intact appearances of the transvenous cardiac leads. IMPRESSION: Lateral left base opacity, possibly early infiltrate or atelectasis. Electronically Signed   By: Andreas Newport M.D.   On: 05/21/2015 04:47   Dg Abd 2 Views  05/21/2015  CLINICAL DATA:  Awakened at 01:15, feeling weakness and dyspnea. Sharp substernal pain. EXAM: ABDOMEN - 2 VIEW COMPARISON:  None. FINDINGS: The bowel gas pattern is normal. There is no evidence of free air. No radio-opaque calculi or other significant radiographic abnormality is seen. IMPRESSION: Negative. Electronically Signed   By: Andreas Newport M.D.   On: 05/21/2015 04:48   I have personally reviewed and evaluated these images and lab results as part of my medical decision-making.   EKG Interpretation   Date/Time:  Sunday May 21 2015 02:58:51 EST Ventricular Rate:  71 PR Interval:  231 QRS Duration: 190 QT Interval:  475 QTC Calculation: 516 R Axis:   51 Text Interpretation:  Electronic ventricular pacemaker Confirmed by Lonia Roane   MD, Lennette Bihari ([id number]) on 05/21/2015 3:27:19 AM      MDM   Diagnosis: Community-acquired pneumonia Generalized weakness  Patient be admitted the hospital for what appears to be developing to me acquired pneumonia which likely is the cause of his weakness.  IV antibiotics now.  Admits the hospital.    I personally performed the services described in this documentation, which was scribed in my presence. The recorded information has been reviewed and is accurate.       Jola Schmidt, MD 05/22/15 [phone]

## 2015-05-21 NOTE — ED Notes (Signed)
98.0 Rectal temp

## 2015-05-21 NOTE — Progress Notes (Signed)
[facility] PROGRESS NOTE  [name] [id number] DOB: 01/01/1926 DOA: 05/21/2015 PCP: Crawford Givens, MD  Assessment/Plan: 1. Suspected community acquired pneumonia -continue ROcephin/Zithromax -repeat CXR in 1-2days  2. Dyspnea/chest discomfort -due to #1 -check troponin again and ECHO  3. CAD.Ischemic cardiomyopathy/EF of 25%/AICD -continue ASA/Coreg/Statin -FU ECHO  4. DM -continue lantus, SSI  5. Debility/weakness -due to #1/cardiomyopathy -PT/OT eval  6. CKD 4 -stable, hold lisinopril  DVT proph: Hep SQ  Code Status:Full COde Family Communication: none at bedside Disposition Plan: Home when improved, 1-2days  HPI/Subjective: Feels a little better, still weak, no further chest pain  Objective: Filed Vitals:   05/21/15 0650 05/21/15 0823  BP: 147/79 125/63  Pulse: 72 77  Temp: 98.5 F (36.9 C) 98 F (36.7 C)  Resp: 19 22   No intake or output data in the 24 hours ending 05/21/15 0909 Filed Weights   05/21/15 0300  Weight: 95.709 kg (211 lb)    Exam:   General:  AAOx3, no distress, obese  Cardiovascular:S1S2/RRR  Respiratory: CTAB  Abdomen: soft, obese, NT, BS present  Musculoskeletal: no edema c/c  Data Reviewed: Basic Metabolic Panel:  Recent Labs Lab 05/21/15 0307  NA 136  K 4.8  CL 107  CO2 20*  GLUCOSE 201*  BUN 36*  CREATININE 2.47*  CALCIUM 8.6*   Liver Function Tests:  Recent Labs Lab 05/21/15 0307  AST 19  ALT 17  ALKPHOS 81  BILITOT 0.5  PROT 5.8*  ALBUMIN 3.0*   No results for input(s): LIPASE, AMYLASE in the last 168 hours. No results for input(s): AMMONIA in the last 168 hours. CBC:  Recent Labs Lab 05/21/15 0307  WBC 10.1  HGB 12.3*  HCT 37.7*  MCV 88.9  PLT 173   Cardiac Enzymes:  Recent Labs Lab 05/21/15 0307  TROPONINI 0.04*   BNP (last 3 results) No results for input(s): BNP in the last 8760 hours.  ProBNP (last 3 results) No results for input(s): PROBNP in the last  8760 hours.  CBG:  Recent Labs Lab 05/21/15 0823  GLUCAP 165*    No results found for this or any previous visit (from the past 240 hour(s)).   Studies: Dg Chest 2 View  05/21/2015  CLINICAL DATA:  Awakened at 1:15 feeling weak and dyspnea. Sharp substernal pain. EXAM: CHEST  2 VIEW COMPARISON:  03/07/2014 CT.  02/10/2012 radiographs. FINDINGS: Opacity in the lateral left base may represent early infiltrate. The right lung is clear. Pulmonary vasculature is normal. There is unchanged moderate cardiomegaly. There are intact appearances of the transvenous cardiac leads. IMPRESSION: Lateral left base opacity, possibly early infiltrate or atelectasis. Electronically Signed   By: Ellery Plunk M.D.   On: 05/21/2015 04:47   Dg Abd 2 Views  05/21/2015  CLINICAL DATA:  Awakened at 01:15, feeling weakness and dyspnea. Sharp substernal pain. EXAM: ABDOMEN - 2 VIEW COMPARISON:  None. FINDINGS: The bowel gas pattern is normal. There is no evidence of free air. No radio-opaque calculi or other significant radiographic abnormality is seen. IMPRESSION: Negative. Electronically Signed   By: Ellery Plunk M.D.   On: 05/21/2015 04:48    Scheduled Meds: . aspirin  81 mg Oral QHS  . azithromycin (ZITHROMAX) 500 MG IVPB  500 mg Intravenous Once  . [START ON 05/22/2015] azithromycin  500 mg Intravenous Q24H  . carvedilol  6.25 mg Oral BID WC  . cefTRIAXone (ROCEPHIN)  IV  1 g Intravenous Once  . [START ON 05/22/2015] cefTRIAXone (ROCEPHIN)  IV  1 g Intravenous Q24H  . cholecalciferol  2,000 Units Oral Daily  . feeding supplement (GLUCERNA SHAKE)  237 mL Oral QPC breakfast  . heparin  5,000 Units Subcutaneous 3 times per day  . insulin aspart  0-5 Units Subcutaneous QHS  . insulin aspart  0-9 Units Subcutaneous TID WC  . insulin glargine  30 Units Subcutaneous Q2200  . pravastatin  40 mg Oral QHS  . sodium chloride  3 mL Intravenous Q12H   Continuous Infusions:  Antibiotics Given (last 72 hours)     None      Principal Problem:   CAP (community acquired pneumonia) Active Problems:   DM (diabetes mellitus), type 2, uncontrolled, with renal complications (HCC)   HTN (hypertension)   Chronic systolic congestive heart failure, NYHA class 3 (HCC)   CAD (coronary artery disease)   Renal insufficiency   Anemia   Weakness    Time spent:    [facility]  [facility] Pager [phone]. If 7PM-7AM, please contact night-coverage at [email or web address], password [facility] 05/21/2015, 9:09 AM  LOS: 0 days

## 2015-05-21 NOTE — Progress Notes (Signed)
Admission note:  Arrival Method: Pt arrived to unit on stretcher from ED with nurse tech Mental Orientation: Alert and oriented x 4 Telemetry: Telemetry box 6e14 applied. CCMD notified Assessment: Completed, see doc flowsheets Skin: MSAD noted to patient's buttocks along with small skin tear on the left buttock cheek. Skin assessed with Earleen Reaper, RN.  IV: Left AC IV. Normal saline infusing at 69ml/hr. Site is clean, dry and intact.  Pain: Pt states no pain at this time.  Tubes: N/A Safety Measures: Bed in lowest position, non-slip socks placed, bed alarm activated, call light within reach Fall Prevention Safety Plan: Reviewed with patient and patient family. Patient verbalized understanding. Patient is a high fall risk Admission Screening: In progress 6700 Orientation: Patient has been oriented to the unit, staff and to the room. Patient is lying comfortably in bed with no further needs stated at this time. Orders have been reviewed and implemented. Call light is within reach. Report given to oncoming RN.   Shelbie Hutching, RN, BSN

## 2015-05-22 ENCOUNTER — Inpatient Hospital Stay (HOSPITAL_COMMUNITY): Payer: Medicare Other

## 2015-05-22 DIAGNOSIS — R531 Weakness: Secondary | ICD-10-CM

## 2015-05-22 DIAGNOSIS — Z794 Long term (current) use of insulin: Secondary | ICD-10-CM

## 2015-05-22 DIAGNOSIS — E1121 Type 2 diabetes mellitus with diabetic nephropathy: Secondary | ICD-10-CM

## 2015-05-22 DIAGNOSIS — I5022 Chronic systolic (congestive) heart failure: Secondary | ICD-10-CM

## 2015-05-22 DIAGNOSIS — R079 Chest pain, unspecified: Secondary | ICD-10-CM

## 2015-05-22 DIAGNOSIS — E1165 Type 2 diabetes mellitus with hyperglycemia: Secondary | ICD-10-CM

## 2015-05-22 LAB — BASIC METABOLIC PANEL
ANION GAP: 9 (ref 5–15)
BUN: 31 mg/dL — ABNORMAL HIGH (ref 6–20)
CO2: 20 mmol/L — AB (ref 22–32)
Calcium: 8.5 mg/dL — ABNORMAL LOW (ref 8.9–10.3)
Chloride: 109 mmol/L (ref 101–111)
Creatinine, Ser: 2.34 mg/dL — ABNORMAL HIGH (ref 0.61–1.24)
GFR calc Af Amer: 27 mL/min — ABNORMAL LOW (ref >60)
GFR calc non Af Amer: 23 mL/min — ABNORMAL LOW (ref >60)
GLUCOSE: 120 mg/dL — AB (ref 65–99)
POTASSIUM: 4.5 mmol/L (ref 3.5–5.1)
Sodium: 138 mmol/L (ref 135–145)

## 2015-05-22 LAB — CBC
HEMATOCRIT: 37.1 % — AB (ref 39.0–52.0)
HEMOGLOBIN: 12.3 g/dL — AB (ref 13.0–17.0)
MCH: 29.5 pg (ref 26.0–34.0)
MCHC: 33.2 g/dL (ref 30.0–36.0)
MCV: 89 fL (ref 78.0–100.0)
Platelets: 158 10*3/uL (ref 150–400)
RBC: 4.17 MIL/uL — ABNORMAL LOW (ref 4.22–5.81)
RDW: 14.6 % (ref 11.5–15.5)
WBC: 7.1 10*3/uL (ref 4.0–10.5)

## 2015-05-22 LAB — T4, FREE: Free T4: 1.47 ng/dL — ABNORMAL HIGH (ref 0.61–1.12)

## 2015-05-22 LAB — GLUCOSE, CAPILLARY
GLUCOSE-CAPILLARY: 190 mg/dL — AB (ref 65–99)
GLUCOSE-CAPILLARY: 199 mg/dL — AB (ref 65–99)
Glucose-Capillary: 144 mg/dL — ABNORMAL HIGH (ref 65–99)
Glucose-Capillary: 247 mg/dL — ABNORMAL HIGH (ref 65–99)

## 2015-05-22 LAB — TSH: TSH: 1.877 u[IU]/mL (ref 0.350–4.500)

## 2015-05-22 MED ORDER — LEVOFLOXACIN 500 MG PO TABS: 500.0000 mg | ORAL_TABLET | Freq: Every day | ORAL | Status: DC

## 2015-05-22 MED ORDER — LEVOFLOXACIN 750 MG PO TABS
750.0000 mg | ORAL_TABLET | ORAL | Status: DC
  Administered 2015-05-23: 750 mg via ORAL
  Filled 2015-05-22: qty 1

## 2015-05-22 NOTE — Progress Notes (Signed)
[facility] PROGRESS NOTE  [name] [id number] DOB: 05-31-1926 DOA: 05/21/2015 PCP: Crawford Givens, MD  Assessment/Plan: 1. Suspected community acquired pneumonia -treated with IV ROcephin/Zithromax -repeat CXR today improving -change to PO levaquin  2. Dyspnea/chest discomfort -due to #1 -troponin negative, FU ECHO  3. CAD.Ischemic cardiomyopathy/EF of 25%/AICD -continue ASA/Coreg/Statin -FU ECHO  4. DM -continue lantus, SSI  5. Debility/weakness -due to #1/cardiomyopathy -PT/OT eval completed -decide regarding HH  6. CKD 4 -stable, hold lisinopril  DVT proph: Hep SQ  Code Status:Full COde Family Communication: none at bedside, called and updated wife Disposition Plan: Home tomorrow if stable  HPI/Subjective: Feels a little better, still weak, no chest pain  Objective: Filed Vitals:   05/22/15 0543 05/22/15 0811  BP: 119/51 128/48  Pulse: 69 69  Temp: 97.8 F (36.6 C) 98 F (36.7 C)  Resp: 16 17    Intake/Output Summary (Last 24 hours) at 05/22/15 1504 Last data filed at 05/22/15 1300  Gross per 24 hour  Intake    650 ml  Output    900 ml  Net   -250 ml   Filed Weights   05/21/15 0300  Weight: 95.709 kg (211 lb)    Exam:   General:  AAOx3, no distress, obese  Cardiovascular:S1S2/RRR  Respiratory: CTAB  Abdomen: soft, obese, NT, BS present  Musculoskeletal: no edema c/c  Data Reviewed: Basic Metabolic Panel:  Recent Labs Lab 05/21/15 0307 05/22/15 0616  NA 136 138  K 4.8 4.5  CL 107 109  CO2 20* 20*  GLUCOSE 201* 120*  BUN 36* 31*  CREATININE 2.47* 2.34*  CALCIUM 8.6* 8.5*   Liver Function Tests:  Recent Labs Lab 05/21/15 0307  AST 19  ALT 17  ALKPHOS 81  BILITOT 0.5  PROT 5.8*  ALBUMIN 3.0*   No results for input(s): LIPASE, AMYLASE in the last 168 hours. No results for input(s): AMMONIA in the last 168 hours. CBC:  Recent Labs Lab 05/21/15 0307 05/22/15 0616  WBC 10.1 7.1  HGB 12.3* 12.3*   HCT 37.7* 37.1*  MCV 88.9 89.0  PLT 173 158   Cardiac Enzymes:  Recent Labs Lab 05/21/15 0307 05/21/15 0946  TROPONINI 0.04* 0.03   BNP (last 3 results) No results for input(s): BNP in the last 8760 hours.  ProBNP (last 3 results) No results for input(s): PROBNP in the last 8760 hours.  CBG:  Recent Labs Lab 05/21/15 1115 05/21/15 1632 05/21/15 2110 05/22/15 0721 05/22/15 1135  GLUCAP 190* 221* 173* 144* 190*    No results found for this or any previous visit (from the past 240 hour(s)).   Studies: Dg Chest 2 View  05/22/2015  CLINICAL DATA:  Left side pneumonia EXAM: CHEST  2 VIEW COMPARISON:  05/21/2015 FINDINGS: Cardiomediastinal silhouette is stable. There is improvement in aeration left base. Minimal residual atelectasis or infiltrate in lingula. No pulmonary edema. Three leads cardiac pacemaker is unchanged in position. Status post CABG. IMPRESSION: There is improvement in aeration left base. Minimal residual atelectasis or infiltrate in lingula. No pulmonary edema. Three leads cardiac pacemaker is unchanged in position. Electronically Signed   By: Natasha Mead M.D.   On: 05/22/2015 11:01   Dg Chest 2 View  05/21/2015  CLINICAL DATA:  Awakened at 1:15 feeling weak and dyspnea. Sharp substernal pain. EXAM: CHEST  2 VIEW COMPARISON:  03/07/2014 CT.  02/10/2012 radiographs. FINDINGS: Opacity in the lateral left base may represent early infiltrate. The right lung is clear. Pulmonary vasculature is normal. There  is unchanged moderate cardiomegaly. There are intact appearances of the transvenous cardiac leads. IMPRESSION: Lateral left base opacity, possibly early infiltrate or atelectasis. Electronically Signed   By: Ellery Plunk M.D.   On: 05/21/2015 04:47   Dg Abd 2 Views  05/21/2015  CLINICAL DATA:  Awakened at 01:15, feeling weakness and dyspnea. Sharp substernal pain. EXAM: ABDOMEN - 2 VIEW COMPARISON:  None. FINDINGS: The bowel gas pattern is normal. There is no  evidence of free air. No radio-opaque calculi or other significant radiographic abnormality is seen. IMPRESSION: Negative. Electronically Signed   By: Ellery Plunk M.D.   On: 05/21/2015 04:48    Scheduled Meds: . aspirin  81 mg Oral QHS  . carvedilol  6.25 mg Oral BID WC  . cholecalciferol  2,000 Units Oral Daily  . feeding supplement (GLUCERNA SHAKE)  237 mL Oral QPC breakfast  . heparin  5,000 Units Subcutaneous 3 times per day  . insulin aspart  0-5 Units Subcutaneous QHS  . insulin aspart  0-9 Units Subcutaneous TID WC  . insulin glargine  30 Units Subcutaneous Q2200  . [START ON 05/23/2015] levofloxacin  750 mg Oral Q48H  . pravastatin  40 mg Oral QHS  . sodium chloride  3 mL Intravenous Q12H   Continuous Infusions:  Antibiotics Given (last 72 hours)    Date/Time Action Medication Dose Rate   05/21/15 1135 Given   cefTRIAXone (ROCEPHIN) 1 g in dextrose 5 % 50 mL IVPB 1 g 100 mL/hr   05/21/15 1135 Given   azithromycin (ZITHROMAX) 500 mg in dextrose 5 % 250 mL IVPB 500 mg 250 mL/hr   05/22/15 0544 Given   cefTRIAXone (ROCEPHIN) 1 g in dextrose 5 % 50 mL IVPB 1 g 100 mL/hr   05/22/15 9563 Given   azithromycin (ZITHROMAX) 500 mg in dextrose 5 % 250 mL IVPB 500 mg 250 mL/hr      Principal Problem:   CAP (community acquired pneumonia) Active Problems:   DM (diabetes mellitus), type 2, uncontrolled, with renal complications (HCC)   HTN (hypertension)   Chronic systolic congestive heart failure, NYHA class 3 (HCC)   CAD (coronary artery disease)   Renal insufficiency   Anemia   Weakness    Time spent:    [facility]  [facility] Pager [phone]. If 7PM-7AM, please contact night-coverage at [email or web address], password [facility] 05/22/2015, 3:04 PM  LOS: 1 day

## 2015-05-22 NOTE — Progress Notes (Signed)
*  PRELIMINARY RESULTS* Echocardiogram 2D Echocardiogram has been performed.  [name] 05/22/2015, 12:02 PM

## 2015-05-22 NOTE — Evaluation (Signed)
Occupational Therapy Evaluation Patient Details Name: [name] MRN: [id number] DOB: May 14, 1926 Today's Date: 05/22/2015    History of Present Illness HPI: [place] is a 81 y.o. male with a history of CAD, Systolic CHF, HTN, DM2, Hyperlipidemia, and CRI ( S/P Left partial Nephrectomy due to RCCa in 2010) who presents to the ED with complaints of increased weakness and malaise x 1 week. He has had a nonproductive cough. He denies any fevers or chills. He was evaluated in the ED and found to have a LLL pneumonia. He was placed on IV antibiotics for CAP pneumonia   Clinical Impression   Patient evaluated by Occupational Therapy with no further acute OT needs identified. All education has been completed and the patient has no further questions. See below for any follow-up Occupational Therapy or equipment needs. OT to sign off. Thank you for referral.   Pt provided EC handout and reviewed with patient. Pt very receptive to education and has (A) from wife upon d/c    Follow Up Recommendations  No OT follow up    Equipment Recommendations  None recommended by OT    Recommendations for Other Services       Precautions / Restrictions Precautions Precautions: Fall      Mobility Bed Mobility Overal bed mobility: Needs Assistance Bed Mobility: Rolling Rolling: Modified independent (Device/Increase time) (with bed rail use)   Supine to sit: Supervision Sit to supine: Supervision   General bed mobility comments: cues for safety with sitting   Transfers Overall transfer level: Needs assistance Equipment used: Rolling walker (2 wheeled) Transfers: Sit to/from Stand Sit to Stand: Supervision         General transfer comment: pt pulling up on RW    Balance             Standing balance-Leahy Scale: Fair                              ADL   Eating/Feeding: Modified independent   Grooming: Wash/dry hands;Wash/dry face;Oral care;Modified  independent Grooming Details (indicate cue type and reason): educated on sitting for adl task as much as possible for [facility]                  Toilet Transfer: Supervision/safety           Functional mobility during ADLs: Supervision/safety;Rolling walker General ADL Comments: cues for safety with RW and staying inside the RW. pt sitting prematurely with RW too far away. Pt educated ON EC with handout provided. pt plans to use warm water and sitting in shower as two techniques for home     Vision     Perception     Praxis      Pertinent Vitals/Pain Pain Assessment: No/denies pain     Hand Dominance Right   Extremity/Trunk Assessment Upper Extremity Assessment Upper Extremity Assessment: Generalized weakness   Lower Extremity Assessment Lower Extremity Assessment: Defer to PT evaluation   Cervical / Trunk Assessment Cervical / Trunk Assessment: Normal   Communication Communication Communication: No difficulties   Cognition Arousal/Alertness: Awake/alert Behavior During Therapy: WFL for tasks assessed/performed Overall Cognitive Status: Within Functional Limits for tasks assessed                     General Comments       Exercises       Shoulder Instructions      Home Living Family/patient expects to  be discharged to:: Private residence Living Arrangements: Spouse/significant other Available Help at Discharge: Family;Available PRN/intermittently Type of Home: House Home Access: Stairs to enter [employer] of Steps: 3 Entrance Stairs-Rails: Right Home Layout: Two level;Laundry or work area in basement;Able to live on main level with bedroom/bathroom     Bathroom Shower/Tub: Occupational psychologist: Handicapped height     Home Equipment: Kasandra Knudsen - single point;Walker - 2 wheels;Shower seat - built in;Grab bars - tub/shower   Additional Comments: pt has (A) from wife upon dc       Prior Functioning/Environment Level of  Independence: Independent with assistive device(s)        Comments: cane prn, reports using RW more lately due to legs feel weak    OT Diagnosis: Generalized weakness   OT Problem List:     OT Treatment/Interventions:      OT Goals(Current goals can be found in the care plan section)    OT Frequency:     Barriers to D/C:            Co-evaluation              End of Session Equipment Utilized During Treatment: Rolling walker Nurse Communication: Mobility status;Precautions  Activity Tolerance: Patient tolerated treatment well Patient left: in bed;with bed alarm set;with call bell/phone within reach   Time: 0908-0924 OT Time Calculation (min): 16 min Charges:  OT General Charges $OT Visit: 1 Procedure OT Evaluation $Initial OT Evaluation Tier I: 1 Procedure G-Codes:    Parke Poisson B 2015/06/02, 9:43 AM   Jeri Modena   OTR/L PagerIP:[phone] Office: [phone] .

## 2015-05-23 LAB — HEMOGLOBIN A1C
HEMOGLOBIN A1C: 9.7 % — AB (ref 4.8–5.6)
Mean Plasma Glucose: 232 mg/dL

## 2015-05-23 LAB — GLUCOSE, CAPILLARY: Glucose-Capillary: 104 mg/dL — ABNORMAL HIGH (ref 65–99)

## 2015-05-23 MED ORDER — LEVOFLOXACIN 750 MG PO TABS: 750.0000 mg | ORAL_TABLET | ORAL | Status: DC

## 2015-05-23 NOTE — Consult Note (Signed)
   [facility] CM Inpatient Consult   05/23/2015  Kashten Shuey 20-Jan-1926 [id number] Received a call from inpatient [facility] regarding patient and EMMI call follow up for pneumonia education needs and to confirm that primary care provider, Dr. Elsie Stain is a network provider and confirmed.  For other questions, please contact: Natividad Brood, RN BSN [facility] Liaison  [phone] business mobile phone

## 2015-05-23 NOTE — Care Management Note (Signed)
Case Management Note  Patient Details  Name: [name] MRN: [id number] Date of Birth: 05-23-1926  Subjective/Objective:       CM following for progression and d/c planning.             Action/Plan: 05/23/2015 Met with pt re [place] which he and family are declining as pt is currently receiving outpt rehab. Pneumonia study (EMMI) with [facility] was explained to pt and family and the pt has agreed to be a part of this followup plan. [facility] rep Eliott Nine RN notified.   Expected Discharge Date:      05/23/2015            Expected Discharge Plan:  Home with self care and care of family  In-House Referral:  NA  Discharge planning Services  CM Consult  Post Acute Care Choice:  NA Choice offered to:  Patient  DME Arranged:  N/A DME Agency:  NA  HH Arranged:  Disease Management [place] Agency:  [place]  Status of Service:  Completed, signed off  Medicare Important Message Given:    Date Medicare IM Given:    Medicare IM give by:    Date Additional Medicare IM Given:    Additional Medicare Important Message give by:     If discussed at [place] of Stay Meetings, dates discussed:    Additional Comments:  Adron Bene, RN 05/23/2015, 11:32 AM

## 2015-05-24 ENCOUNTER — Telehealth: Payer: Self-pay

## 2015-05-24 NOTE — Telephone Encounter (Signed)
Transition Care Management Follow-up Telephone Call   Date discharged? 05/23/2015   How have you been since you were released from the hospital? Pt states he is feeling good, little tired;having problem walking but this is not new problem(neuropathy)   Do you understand why you were in the hospital? yes   Do you understand the discharge instructions? yes   Where were you discharged to? home   Items Reviewed:  Medications reviewed: yes  Allergies reviewed: yes  Dietary changes reviewed: yes  Referrals reviewed: N/A   Functional Questionnaire:   Activities of Daily Living (ADLs):   He states they are independent in the following: ambulation, bathing and hygiene, feeding, continence, grooming, toileting and dressing States they require assistance with the following: none   Any transportation issues/concerns?: no   Any patient concerns? no   Confirmed importance and date/time of follow-up visits scheduled yes, pt has appt 05/29/15 at 12:15   Provider Appointment booked with Dr Damita Dunnings.  Confirmed with patient if condition begins to worsen call PCP or go to the ER.  Patient was given the office number and encouraged to call back with question or concerns.  : yes

## 2015-05-29 ENCOUNTER — Other Ambulatory Visit: Payer: Self-pay

## 2015-05-29 ENCOUNTER — Inpatient Hospital Stay (HOSPITAL_COMMUNITY)
Admission: EM | Admit: 2015-05-29 | Discharge: 2015-06-05 | DRG: 281 | Disposition: A | Discharge status: Hospice | Payer: Medicare Other | Attending: Cardiovascular Disease | Admitting: Cardiovascular Disease

## 2015-05-29 ENCOUNTER — Encounter (HOSPITAL_COMMUNITY)
Admission: EM | Disposition: A | Discharge status: Hospice | Payer: Self-pay | Source: Home / Self Care | Attending: Cardiovascular Disease

## 2015-05-29 ENCOUNTER — Ambulatory Visit: Payer: Self-pay | Admitting: Family Medicine

## 2015-05-29 ENCOUNTER — Emergency Department (HOSPITAL_COMMUNITY): Payer: Medicare Other

## 2015-05-29 ENCOUNTER — Encounter (HOSPITAL_COMMUNITY): Payer: Self-pay

## 2015-05-29 DIAGNOSIS — Z794 Long term (current) use of insulin: Secondary | ICD-10-CM | POA: Diagnosis not present

## 2015-05-29 DIAGNOSIS — I5042 Chronic combined systolic (congestive) and diastolic (congestive) heart failure: Secondary | ICD-10-CM | POA: Diagnosis present

## 2015-05-29 DIAGNOSIS — Z85528 Personal history of other malignant neoplasm of kidney: Secondary | ICD-10-CM | POA: Diagnosis not present

## 2015-05-29 DIAGNOSIS — Z951 Presence of aortocoronary bypass graft: Secondary | ICD-10-CM

## 2015-05-29 DIAGNOSIS — I25119 Atherosclerotic heart disease of native coronary artery with unspecified angina pectoris: Secondary | ICD-10-CM | POA: Diagnosis present

## 2015-05-29 DIAGNOSIS — E875 Hyperkalemia: Secondary | ICD-10-CM | POA: Diagnosis not present

## 2015-05-29 DIAGNOSIS — R0789 Other chest pain: Secondary | ICD-10-CM | POA: Diagnosis not present

## 2015-05-29 DIAGNOSIS — I255 Ischemic cardiomyopathy: Secondary | ICD-10-CM

## 2015-05-29 DIAGNOSIS — K5900 Constipation, unspecified: Secondary | ICD-10-CM | POA: Diagnosis present

## 2015-05-29 DIAGNOSIS — Z515 Encounter for palliative care: Secondary | ICD-10-CM | POA: Diagnosis present

## 2015-05-29 DIAGNOSIS — Z95810 Presence of automatic (implantable) cardiac defibrillator: Secondary | ICD-10-CM

## 2015-05-29 DIAGNOSIS — Z905 Acquired absence of kidney: Secondary | ICD-10-CM | POA: Diagnosis not present

## 2015-05-29 DIAGNOSIS — E785 Hyperlipidemia, unspecified: Secondary | ICD-10-CM | POA: Diagnosis present

## 2015-05-29 DIAGNOSIS — I10 Essential (primary) hypertension: Secondary | ICD-10-CM | POA: Diagnosis present

## 2015-05-29 DIAGNOSIS — D649 Anemia, unspecified: Secondary | ICD-10-CM

## 2015-05-29 DIAGNOSIS — I2583 Coronary atherosclerosis due to lipid rich plaque: Secondary | ICD-10-CM | POA: Diagnosis present

## 2015-05-29 DIAGNOSIS — I110 Hypertensive heart disease with heart failure: Secondary | ICD-10-CM | POA: Diagnosis not present

## 2015-05-29 DIAGNOSIS — I25810 Atherosclerosis of coronary artery bypass graft(s) without angina pectoris: Secondary | ICD-10-CM | POA: Diagnosis not present

## 2015-05-29 DIAGNOSIS — Z66 Do not resuscitate: Secondary | ICD-10-CM | POA: Diagnosis not present

## 2015-05-29 DIAGNOSIS — E1122 Type 2 diabetes mellitus with diabetic chronic kidney disease: Secondary | ICD-10-CM | POA: Diagnosis not present

## 2015-05-29 DIAGNOSIS — N289 Disorder of kidney and ureter, unspecified: Secondary | ICD-10-CM | POA: Diagnosis not present

## 2015-05-29 DIAGNOSIS — Z7982 Long term (current) use of aspirin: Secondary | ICD-10-CM

## 2015-05-29 DIAGNOSIS — R079 Chest pain, unspecified: Secondary | ICD-10-CM | POA: Diagnosis not present

## 2015-05-29 DIAGNOSIS — I252 Old myocardial infarction: Secondary | ICD-10-CM | POA: Diagnosis not present

## 2015-05-29 DIAGNOSIS — N184 Chronic kidney disease, stage 4 (severe): Secondary | ICD-10-CM | POA: Diagnosis not present

## 2015-05-29 DIAGNOSIS — J189 Pneumonia, unspecified organism: Secondary | ICD-10-CM | POA: Diagnosis not present

## 2015-05-29 DIAGNOSIS — Z7189 Other specified counseling: Secondary | ICD-10-CM | POA: Insufficient documentation

## 2015-05-29 DIAGNOSIS — I2510 Atherosclerotic heart disease of native coronary artery without angina pectoris: Secondary | ICD-10-CM | POA: Diagnosis not present

## 2015-05-29 DIAGNOSIS — I214 Non-ST elevation (NSTEMI) myocardial infarction: Secondary | ICD-10-CM | POA: Diagnosis not present

## 2015-05-29 DIAGNOSIS — I130 Hypertensive heart and chronic kidney disease with heart failure and stage 1 through stage 4 chronic kidney disease, or unspecified chronic kidney disease: Secondary | ICD-10-CM | POA: Diagnosis not present

## 2015-05-29 DIAGNOSIS — S2190XA Unspecified open wound of unspecified part of thorax, initial encounter: Secondary | ICD-10-CM | POA: Diagnosis not present

## 2015-05-29 DIAGNOSIS — R0600 Dyspnea, unspecified: Secondary | ICD-10-CM | POA: Diagnosis not present

## 2015-05-29 DIAGNOSIS — I25111 Atherosclerotic heart disease of native coronary artery with angina pectoris with documented spasm: Secondary | ICD-10-CM | POA: Diagnosis not present

## 2015-05-29 DIAGNOSIS — I25708 Atherosclerosis of coronary artery bypass graft(s), unspecified, with other forms of angina pectoris: Secondary | ICD-10-CM | POA: Diagnosis not present

## 2015-05-29 HISTORY — PX: CARDIAC CATHETERIZATION: SHX172

## 2015-05-29 HISTORY — DX: Atherosclerotic heart disease of native coronary artery without angina pectoris: I25.10

## 2015-05-29 LAB — DIFFERENTIAL
Basophils Absolute: 0 10*3/uL (ref 0.0–0.1)
Basophils Relative: 0 %
EOS PCT: 4 %
Eosinophils Absolute: 0.4 10*3/uL (ref 0.0–0.7)
LYMPHS ABS: 1.5 10*3/uL (ref 0.7–4.0)
LYMPHS PCT: 16 %
Monocytes Absolute: 1.3 10*3/uL — ABNORMAL HIGH (ref 0.1–1.0)
Monocytes Relative: 13 %
NEUTROS ABS: 6.3 10*3/uL (ref 1.7–7.7)
NEUTROS PCT: 67 %

## 2015-05-29 LAB — CBC
HEMATOCRIT: 36.8 % — AB (ref 39.0–52.0)
Hemoglobin: 12.1 g/dL — ABNORMAL LOW (ref 13.0–17.0)
MCH: 29.2 pg (ref 26.0–34.0)
MCHC: 32.9 g/dL (ref 30.0–36.0)
MCV: 88.7 fL (ref 78.0–100.0)
PLATELETS: 190 10*3/uL (ref 150–400)
RBC: 4.15 MIL/uL — AB (ref 4.22–5.81)
RDW: 14.6 % (ref 11.5–15.5)
WBC: 9.7 10*3/uL (ref 4.0–10.5)

## 2015-05-29 LAB — BASIC METABOLIC PANEL
Anion gap: 8 (ref 5–15)
BUN: 38 mg/dL — AB (ref 6–20)
CHLORIDE: 106 mmol/L (ref 101–111)
CO2: 19 mmol/L — AB (ref 22–32)
CREATININE: 2.73 mg/dL — AB (ref 0.61–1.24)
Calcium: 8.2 mg/dL — ABNORMAL LOW (ref 8.9–10.3)
GFR calc non Af Amer: 19 mL/min — ABNORMAL LOW (ref >60)
GFR, EST AFRICAN AMERICAN: 23 mL/min — AB (ref >60)
Glucose, Bld: 218 mg/dL — ABNORMAL HIGH (ref 65–99)
Potassium: 6.2 mmol/L (ref 3.5–5.1)
Sodium: 133 mmol/L — ABNORMAL LOW (ref 135–145)

## 2015-05-29 LAB — I-STAT TROPONIN, ED: Troponin i, poc: 0.71 ng/mL (ref 0.00–0.08)

## 2015-05-29 LAB — TROPONIN I
TROPONIN I: 2.79 ng/mL — AB (ref <0.031)
TROPONIN I: 10.75 ng/mL — AB (ref <0.031)
Troponin I: 0.52 ng/mL (ref <0.031)
Troponin I: 14.13 ng/mL (ref <0.031)

## 2015-05-29 LAB — POCT ACTIVATED CLOTTING TIME: ACTIVATED CLOTTING TIME: 162 s

## 2015-05-29 LAB — MRSA PCR SCREENING: MRSA by PCR: NEGATIVE

## 2015-05-29 LAB — GLUCOSE, CAPILLARY: Glucose-Capillary: 80 mg/dL (ref 65–99)

## 2015-05-29 SURGERY — LEFT HEART CATH AND CORS/GRAFTS ANGIOGRAPHY

## 2015-05-29 MED ORDER — ALUM & MAG HYDROXIDE-SIMETH 200-200-20 MG/5ML PO SUSP
30.0000 mL | ORAL | Status: DC | PRN
  Administered 2015-05-29 – 2015-06-03 (×2): 30 mL via ORAL
  Filled 2015-05-29 (×2): qty 30

## 2015-05-29 MED ORDER — SODIUM CHLORIDE 0.9 % IV SOLN: 250.0000 mL | INTRAVENOUS | Status: DC | PRN

## 2015-05-29 MED ORDER — PRAVASTATIN SODIUM 40 MG PO TABS
40.0000 mg | ORAL_TABLET | Freq: Every day | ORAL | Status: DC
  Administered 2015-05-29 – 2015-06-04 (×7): 40 mg via ORAL
  Filled 2015-05-29 (×7): qty 1

## 2015-05-29 MED ORDER — ONDANSETRON HCL 4 MG/2ML IJ SOLN: 4.0000 mg | Freq: Four times a day (QID) | INTRAMUSCULAR | Status: DC | PRN

## 2015-05-29 MED ORDER — HYDRALAZINE HCL 20 MG/ML IJ SOLN: 10.0000 mg | INTRAMUSCULAR | Status: DC | PRN

## 2015-05-29 MED ORDER — SODIUM CHLORIDE 0.9 % WEIGHT BASED INFUSION
1.0000 mL/kg/h | INTRAVENOUS | Status: AC
Start: 2015-05-29 — End: 2015-05-29

## 2015-05-29 MED ORDER — INSULIN GLARGINE 100 UNIT/ML ~~LOC~~ SOLN
10.0000 [IU] | Freq: Every day | SUBCUTANEOUS | Status: DC
Start: 2015-05-29 — End: 2015-06-05
  Administered 2015-05-29 – 2015-06-04 (×7): 10 [IU] via SUBCUTANEOUS
  Filled 2015-05-29 (×8): qty 0.1

## 2015-05-29 MED ORDER — LIDOCAINE HCL (PF) 1 % IJ SOLN
INTRAMUSCULAR | Status: DC | PRN
  Administered 2015-05-29: 11:00:00

## 2015-05-29 MED ORDER — SODIUM CHLORIDE 0.9 % IJ SOLN
3.0000 mL | Freq: Two times a day (BID) | INTRAMUSCULAR | Status: DC
Start: 2015-05-29 — End: 2015-06-05
  Administered 2015-05-29 – 2015-06-05 (×12): 3 mL via INTRAVENOUS

## 2015-05-29 MED ORDER — SODIUM CHLORIDE 0.9 % IV SOLN
INTRAVENOUS | Status: DC | PRN
  Administered 2015-05-29: 50 mL/h via INTRAVENOUS

## 2015-05-29 MED ORDER — DEXTROSE 50 % IV SOLN
50.0000 mL | Freq: Once | INTRAVENOUS | Status: AC
  Administered 2015-05-29: 50 mL via INTRAVENOUS
  Filled 2015-05-29: qty 50

## 2015-05-29 MED ORDER — HEPARIN (PORCINE) IN NACL 100-0.45 UNIT/ML-% IJ SOLN
1400.0000 [IU]/h | INTRAMUSCULAR | Status: DC
  Administered 2015-05-29 – 2015-05-30 (×2): 1150 [IU]/h via INTRAVENOUS
  Filled 2015-05-29 (×2): qty 250

## 2015-05-29 MED ORDER — ACETAMINOPHEN 325 MG PO TABS
650.0000 mg | ORAL_TABLET | ORAL | Status: DC | PRN
  Filled 2015-05-29 (×2): qty 2

## 2015-05-29 MED ORDER — INSULIN GLARGINE 100 UNIT/ML ~~LOC~~ SOLN
30.0000 [IU] | Freq: Every day | SUBCUTANEOUS | Status: DC
  Filled 2015-05-29: qty 0.3

## 2015-05-29 MED ORDER — NITROGLYCERIN 0.4 MG SL SUBL
0.4000 mg | SUBLINGUAL_TABLET | SUBLINGUAL | Status: DC | PRN
  Administered 2015-06-01 (×3): 0.4 mg via SUBLINGUAL
  Filled 2015-05-29 (×2): qty 1

## 2015-05-29 MED ORDER — SODIUM POLYSTYRENE SULFONATE 15 GM/60ML PO SUSP
30.0000 g | Freq: Once | ORAL | Status: DC
Start: 2015-05-29 — End: 2015-05-29
  Filled 2015-05-29: qty 120

## 2015-05-29 MED ORDER — LISINOPRIL 2.5 MG PO TABS
2.5000 mg | ORAL_TABLET | Freq: Every day | ORAL | Status: DC
  Administered 2015-05-29: 2.5 mg via ORAL
  Filled 2015-05-29: qty 1

## 2015-05-29 MED ORDER — GLUCERNA SHAKE PO LIQD
237.0000 mL | Freq: Every day | ORAL | Status: DC
  Administered 2015-06-05: 237 mL via ORAL

## 2015-05-29 MED ORDER — LIDOCAINE HCL (PF) 1 % IJ SOLN
INTRAMUSCULAR | Status: AC
Start: 2015-05-29 — End: 2015-05-29
  Filled 2015-05-29: qty 30

## 2015-05-29 MED ORDER — SODIUM POLYSTYRENE SULFONATE 15 GM/60ML PO SUSP
30.0000 g | Freq: Once | ORAL | Status: AC
  Administered 2015-05-29: 30 g via RECTAL
  Filled 2015-05-29: qty 120

## 2015-05-29 MED ORDER — HEPARIN (PORCINE) IN NACL 2-0.9 UNIT/ML-% IJ SOLN
INTRAMUSCULAR | Status: AC
Start: 2015-05-29 — End: 2015-05-29
  Filled 2015-05-29: qty 1000

## 2015-05-29 MED ORDER — ONDANSETRON HCL 4 MG/2ML IJ SOLN
4.0000 mg | Freq: Once | INTRAMUSCULAR | Status: AC
  Administered 2015-05-29: 4 mg via INTRAVENOUS
  Filled 2015-05-29: qty 2

## 2015-05-29 MED ORDER — ONDANSETRON HCL 4 MG/2ML IJ SOLN
4.0000 mg | Freq: Four times a day (QID) | INTRAMUSCULAR | Status: DC | PRN
  Administered 2015-05-30 – 2015-06-02 (×2): 4 mg via INTRAVENOUS
  Filled 2015-05-29 (×2): qty 2

## 2015-05-29 MED ORDER — ASPIRIN EC 81 MG PO TBEC
81.0000 mg | DELAYED_RELEASE_TABLET | Freq: Every day | ORAL | Status: DC
  Administered 2015-05-30 – 2015-06-05 (×7): 81 mg via ORAL
  Filled 2015-05-29 (×7): qty 1

## 2015-05-29 MED ORDER — SODIUM CHLORIDE 0.9 % IJ SOLN: 3.0000 mL | INTRAMUSCULAR | Status: DC | PRN

## 2015-05-29 MED ORDER — HEPARIN (PORCINE) IN NACL 100-0.45 UNIT/ML-% IJ SOLN
1150.0000 [IU]/h | INTRAMUSCULAR | Status: DC
Start: 2015-05-29 — End: 2015-05-29
  Administered 2015-05-29: 1150 [IU]/h via INTRAVENOUS
  Filled 2015-05-29: qty 250

## 2015-05-29 MED ORDER — ACETAMINOPHEN 325 MG PO TABS
650.0000 mg | ORAL_TABLET | ORAL | Status: DC | PRN
  Administered 2015-05-29 – 2015-05-30 (×2): 650 mg via ORAL

## 2015-05-29 MED ORDER — ALBUTEROL SULFATE (2.5 MG/3ML) 0.083% IN NEBU
2.5000 mg | INHALATION_SOLUTION | Freq: Once | RESPIRATORY_TRACT | Status: AC
  Administered 2015-05-29: 2.5 mg via RESPIRATORY_TRACT
  Filled 2015-05-29: qty 3

## 2015-05-29 MED ORDER — CARVEDILOL 6.25 MG PO TABS
6.2500 mg | ORAL_TABLET | Freq: Two times a day (BID) | ORAL | Status: DC
  Administered 2015-05-29 – 2015-06-05 (×15): 6.25 mg via ORAL
  Filled 2015-05-29 (×15): qty 1

## 2015-05-29 MED ORDER — HEPARIN BOLUS VIA INFUSION
4000.0000 [IU] | Freq: Once | INTRAVENOUS | Status: AC
  Administered 2015-05-29: 4000 [IU] via INTRAVENOUS
  Filled 2015-05-29: qty 4000

## 2015-05-29 MED ORDER — MORPHINE SULFATE (PF) 4 MG/ML IV SOLN
4.0000 mg | Freq: Once | INTRAVENOUS | Status: AC
  Administered 2015-05-29: 4 mg via INTRAVENOUS
  Filled 2015-05-29: qty 1

## 2015-05-29 MED ORDER — IOHEXOL 350 MG/ML SOLN
INTRAVENOUS | Status: DC | PRN
  Administered 2015-05-29: 75 mL via INTRACARDIAC

## 2015-05-29 MED ORDER — INSULIN ASPART 100 UNIT/ML ~~LOC~~ SOLN
10.0000 [IU] | Freq: Once | SUBCUTANEOUS | Status: AC
  Administered 2015-05-29: 10 [IU] via INTRAVENOUS
  Filled 2015-05-29: qty 1

## 2015-05-29 MED ORDER — VITAMIN D 1000 UNITS PO TABS
2000.0000 [IU] | ORAL_TABLET | Freq: Every day | ORAL | Status: DC
  Administered 2015-05-29 – 2015-06-05 (×8): 2000 [IU] via ORAL
  Filled 2015-05-29 (×9): qty 2

## 2015-05-29 MED ORDER — MORPHINE SULFATE (PF) 4 MG/ML IV SOLN
4.0000 mg | Freq: Once | INTRAVENOUS | Status: DC
  Filled 2015-05-29: qty 1

## 2015-05-29 MED ORDER — MORPHINE SULFATE (PF) 2 MG/ML IV SOLN
2.0000 mg | INTRAVENOUS | Status: DC | PRN
  Administered 2015-05-30: 2 mg via INTRAVENOUS
  Filled 2015-05-29: qty 1

## 2015-05-29 SURGICAL SUPPLY — 9 items
CATH INFINITI 5FR JL5 (CATHETERS) ×2 IMPLANT
CATH INFINITI 5FR MULTPACK ANG (CATHETERS) ×2 IMPLANT
KIT HEART LEFT (KITS) ×3 IMPLANT
PACK CARDIAC CATHETERIZATION (CUSTOM PROCEDURE TRAY) ×3 IMPLANT
SHEATH PINNACLE 6F 10CM (SHEATH) ×2 IMPLANT
SYR MEDRAD MARK V 150ML (SYRINGE) ×3 IMPLANT
TRANSDUCER W/STOPCOCK (MISCELLANEOUS) ×3 IMPLANT
TUBING CIL FLEX 10 FLL-RA (TUBING) ×3 IMPLANT
WIRE EMERALD 3MM-J .035X150CM (WIRE) ×2 IMPLANT

## 2015-05-29 NOTE — Progress Notes (Addendum)
Pt c/o feeling throat burning, having reflux. Wife states he has it at home at times and takes zantac as needed for it. Pt given prn maalox at this time. Will wait til off bedrest and speech therapy sees as ordered before giving any po tablets scheduled.

## 2015-05-29 NOTE — H&P (Signed)
  Patient with known CAD, ongoing chest pain and positive enzymes presents for urgent cardiac catheterization.  Lorretta Harp, M.D., [place], [facility], [place] [address] [place], [place]  [id number]  [phone] 05/29/2015 10:14 AM

## 2015-05-29 NOTE — Evaluation (Signed)
Clinical/Bedside Swallow Evaluation Patient Details  Name: [name] MRN: [id number] Date of Birth: March 05, 1926  Today's Date: 05/29/2015 Time: SLP Start Time (ACUTE ONLY): [id number] SLP Stop Time (ACUTE ONLY): 1657 SLP Time Calculation (min) (ACUTE ONLY): 16 min  Past Medical History:  Past Medical History  Diagnosis Date  . HTN (hypertension) (06/17/1981)  . Diverticula of colon (06/18/1983)  . Renal insufficiency   . Hyperlipidemia 06/17/1993)  . Diabetes mellitus  (06/17/1997)  . PUD (peptic ulcer disease) 1960's  . Pancreatitis 01/24 - 07/13/08    ABD CT  fatty liver early pancreatitis  07/12/2008  Pelvic CT Nml 07/12/2008  . Cardiomyopathy     EF 25% Ant Akinesis Global Hypo 3/2-08/18/08  . Thyroid nodule     per Dr. Brantley Stage  . Renal disease     per Dr. Posey Pronto  . Hypogonadism male   . GIB (gastrointestinal bleeding)     admission 07/2014, likely diverticular bleeding  . Arthritis   . Lower GI bleed 08/08/2014  . History of MI (myocardial infarction)     January 2010  . CAD (coronary artery disease)     s/p CABG in 2010, LIMA-LAD, SVG-1st Diagonal, SVG-OM, SVG-Posterior Descending of the RCA   Past Surgical History:  Past Surgical History  Procedure Laterality Date  . Colonoscopy  12/2007    4 mm sigmoid HP polyp, diverticulosis. random biopsies negative for colitis.   . Cardiac catheterization       Severe 3-vessel coronary artery disease.  Ischemic   cardiomyopathy.   . Nephrectomy  01/09/09    Partial left nephrectomy. Clear Cell Renal Cell Carcinoma 01/09/2009  . Cardiac defibrillator placement      ICD-Medtronic .  [id number] H  . Laparotomy       small mid 80's (laparotomy), 09/12/2000 (no surgery), 10/20/2008 (no surgery)  . Lesion removal      RLE 11/14/2006 (Dr Hulen Skains)   . Ct of abdomen  06/1997    Fatty process unchanged - myelolipoma adrenal unchangd  . Ct of abdomen  02/99    Adrenal mass unchanged, no further follow up required  . Sbo  08/2000 no surgery    10/20/08 no surgery    Mid 80's laparotomy  . Esophagogastroduodenoscopy  07/12/2008    Schatzki Ring 2 cm. HH (Dr. Deatra Ina)  . Abdominal US  07/12/08    Fatty infiltrate liver complex cyst in lower pole left kidney  . Icd/ crt (lv lead disabled)      Medtronic [id number]  . Coronary artery bypass graft  09/05/08     Mediastinotomy, extracorporeal circulation,   coronary artery bypass graft surgery x4 using a left internal mammary  artery graft to the left anterior descending coronary artery, with a  saphenous vein graft to the first diagonal branch of the LAD, a   saphenous vein graft to the obtuse marginal branch of the left   circumflex coronary artery,  Dr. Cyndia Bent  . Cholecystectomy  1970's  . Esophagogastroduodenoscopy N/A 08/09/2014    Procedure: ESOPHAGOGASTRODUODENOSCOPY (EGD);  Surgeon: Ladene Artist, MD;  Location: [facility] ENDOSCOPY;  Service: Endoscopy;  Laterality: N/A;  . Colonoscopy with propofol N/A 08/10/2014    Procedure: COLONOSCOPY WITH PROPOFOL;  Surgeon: Ladene Artist, MD;  Location: [facility] ENDOSCOPY;  Service: Endoscopy;  Laterality: N/A;  . Cardiac catheterization N/A 05/29/2015    Procedure: Left Heart Cath and Cors/Grafts Angiography;  Surgeon: Lorretta Harp, MD;  Location: [facility] CV LAB;  Service: Cardiovascular;  Laterality: N/A;  HPI:  81 y.o. male with past medical history of CAD, cardiomyopathy, chronic combined systolic and diastolic CHF, HTN, stage 4 CKD, GERD, recent CAP admitted 12/12 with NSTEMI.  Underwent cardiac cath 12/12 am.    Assessment / Plan / Recommendation Clinical Impression  Pt presents with normal oropharyngeal swallow marked by adequate mastication, brisk swallow response, and no overt s/s of aspiration.  He presents with belching, c/o frequent heart burn.  Recommended to pt/wife that he f/u with PCP re: GERD w/'u.  No further SLP f/u needed.      Aspiration Risk  No limitations    Diet Recommendation   HH diet thin liquids   Medication  Administration: Whole meds with liquid    Other  Recommendations Oral Care Recommendations: Oral care BID              Swallow Study   General Date of Onset: 05/29/15 HPI: 81 y.o. male with past medical history of CAD, cardiomyopathy, chronic combined systolic and diastolic CHF, HTN, stage 4 CKD, GERD, recent CAP admitted 12/12 with NSTEMI.  Underwent cardiac cath 12/12 am.  Type of Study: Bedside Swallow Evaluation Diet Prior to this Study: NPO Temperature Spikes Noted: No Respiratory Status: Room air Behavior/Cognition: Alert;Cooperative;Pleasant mood Oral Cavity Assessment: Within Functional Limits Oral Care Completed by SLP: No Oral Cavity - Dentition: Adequate natural dentition Vision: Functional for self-feeding Self-Feeding Abilities: Able to feed self Patient Positioning: Upright in bed Baseline Vocal Quality: Normal Volitional Cough: Strong Volitional Swallow: Able to elicit    Oral/Motor/Sensory Function Overall Oral Motor/Sensory Function: Within functional limits   Ice Chips Ice chips: Within functional limits Presentation: Self Fed   Thin Liquid Thin Liquid: Within functional limits    Nectar Thick Nectar Thick Liquid: Not tested   Honey Thick Honey Thick Liquid: Not tested   Puree Puree: Within functional limits Presentation: Self Fed;Spoon   Solid Solid: Within functional limits Presentation: Self Fed       [name] 05/29/2015,4:58 PM

## 2015-05-29 NOTE — ED Notes (Signed)
Pt held kayexalate in rectum for approx 20 minutes.

## 2015-05-29 NOTE — H&P (Signed)
Patient ID: Yancey Lankenau MRN: [id number], DOB/AGE: 07-25-1925   Admit date: 05/29/2015   Primary Physician: Elsie Stain, MD Primary Cardiologist: Dr. Gwenlyn Found; Dr. Percival Spanish   [id number] Nosbisch is a 81 y.o. male with past medical history of CAD (s/p CABG in 2010, LIMA-LAD, SVG-1st Diagonal, SVG-OM, SVG-Posterior Descending of the RCA) ischemic cardiomyopathy (EF 20%, s/p ICD placement 2011), chronic combined systolic and diastolic CHF, Stage 4 CKD (s/p left partial nephrectomy in 2010 due to [place]), HTN, HLD, Type 2 DM who presents to [facility] ED on 05/29/2015 for evaluation of chest pain.  The patient reports he developed right shoulder pain last night at 1100. He tried using a heating pad and arthritis cream which usually helps with his chronic shoulder pain (following a fall in April 2016) but says his pain persisted. At around 0200 this morning, he was awoken from sleep due to the right shoulder pain and central chest pressure. He reports the pressure was a 6/10. He took expired SL NTG, but had no relief. The pain was also radiating down his left arm. He denies any associated nausea, vomiting, or diaphoresis. He reports having constipation for the past week and feeling weak over the past several days.   En route to the ED, he was administered 3 SL NTG and his pressure dropped to 70's/30's. He was given 367mL of NS. His BP has since been 94/47 - 132/87 while in the ED. He is still having active chest pressure at a 2/10.  His initial troponin value was elevated to 0.52, and a repeat value is elevated to 2.79. His K+ was elevated to 6.2 and Kayexalate has been administered. Creatinine is 2.73, which is his baseline. His EKG shows a V-paced rhythm with rate in 80's. No acute changes since his previous tracing.   He was recently admitted from 05/21/2015 - 05/23/2015 for CAP. Had an echo at that time which showed an EF of 20%, Diffuse hypokinesis, and Grade 1 DD.   Home Medications Prior  to Admission medications   Medication Sig Start Date End Date Taking? Authorizing Provider  aspirin 325 MG tablet Take 325 mg by mouth daily.   Yes Historical Provider, MD  carvedilol (COREG) 6.25 MG tablet Take 6.25 mg by mouth 2 (two) times daily.    Yes Historical Provider, MD  Cholecalciferol (VITAMIN D) 2000 UNITS tablet Take 2,000 Units by mouth daily.   Yes Historical Provider, MD  Insulin Glargine (LANTUS) 100 UNIT/ML Solostar Pen Inject 30 Units into the skin daily at 10 pm.    Yes Historical Provider, MD  lisinopril (PRINIVIL,ZESTRIL) 2.5 MG tablet Take 2.5 mg by mouth daily.   Yes Historical Provider, MD  nitroGLYCERIN (NITROSTAT) 0.4 MG SL tablet Place 1 tablet (0.4 mg total) under the tongue every 5 (five) minutes as needed. Patient taking differently: Place 0.4 mg under the tongue every 5 (five) minutes as needed for chest pain.  08/05/14  Yes Tonia Ghent, MD  Nutritional Supplements (GLUCERNA SHAKE PO) Take 4 oz by mouth daily after breakfast.     Yes Historical Provider, MD  pravastatin (PRAVACHOL) 40 MG tablet TAKE 1 TABLET BY MOUTH AT BEDTIME 01/23/15  Yes Tonia Ghent, MD  testosterone cypionate (DEPOTESTOTERONE CYPIONATE) 200 MG/ML injection INJECT 1.25ML INTO THE MUSCLE EVERY 28 DAYS 06/30/14  Yes Tonia Ghent, MD    Allergies Allergies  Allergen Reactions  . Celecoxib     REACTION: myalgia: abdominal pain  . Kayexalate [Polystyrene] Other (See Comments)  constipation  . Nsaids     REACTION: renal disease  . Rosuvastatin     REACTION: myalgia: musle pain    Past Medical History Past Medical History  Diagnosis Date  . HTN (hypertension) (06/17/1981)  . Diverticula of colon (06/18/1983)  . Renal insufficiency   . Hyperlipidemia 06/17/1993)  . Diabetes mellitus  (06/17/1997)  . PUD (peptic ulcer disease) 1960's  . Pancreatitis 01/24 - 07/13/08    ABD CT  fatty liver early pancreatitis  07/12/2008  Pelvic CT Nml 07/12/2008  . Cardiomyopathy     EF 25% Ant  Akinesis Global Hypo 3/2-08/18/08  . Thyroid nodule     per Dr. Brantley Stage  . Renal disease     per Dr. Posey Pronto  . Hypogonadism male   . GIB (gastrointestinal bleeding)     admission 07/2014, likely diverticular bleeding  . Arthritis   . Lower GI bleed 08/08/2014  . History of MI (myocardial infarction)     January 2010  . CAD (coronary artery disease)     s/p CABG in 2010, LIMA-LAD, SVG-1st Diagonal, SVG-OM, SVG-Posterior Descending of the RCA     Surgical History   Past Surgical History  Procedure Laterality Date  . Colonoscopy  12/2007    4 mm sigmoid HP polyp, diverticulosis. random biopsies negative for colitis.   . Cardiac catheterization       Severe 3-vessel coronary artery disease.  Ischemic   cardiomyopathy.   . Nephrectomy  01/09/09    Partial left nephrectomy. Clear Cell Renal Cell Carcinoma 01/09/2009  . Cardiac defibrillator placement      ICD-Medtronic .  [id number]  . Laparotomy       small mid 80's (laparotomy), 09/12/2000 (no surgery), 10/20/2008 (no surgery)  . Lesion removal      RLE 11/14/2006 (Dr Hulen Skains)   . Ct of abdomen  06/1997    Fatty process unchanged - myelolipoma adrenal unchangd  . Ct of abdomen  02/99    Adrenal mass unchanged, no further follow up required  . Sbo  08/2000 no surgery   10/20/08 no surgery    Mid 80's laparotomy  . Esophagogastroduodenoscopy  07/12/2008    Schatzki Ring 2 cm. HH (Dr. Deatra Ina)  . Abdominal US  07/12/08    Fatty infiltrate liver complex cyst in lower pole left kidney  . Icd/ crt (lv lead disabled)      Medtronic [id number]  . Coronary artery bypass graft  09/05/08     Mediastinotomy, extracorporeal circulation,   coronary artery bypass graft surgery x4 using a left internal mammary  artery graft to the left anterior descending coronary artery, with a  saphenous vein graft to the first diagonal branch of the LAD, a   saphenous vein graft to the obtuse marginal branch of the left   circumflex coronary artery,  Dr. Cyndia Bent  .  Cholecystectomy  1970's  . Esophagogastroduodenoscopy N/A 08/09/2014    Procedure: ESOPHAGOGASTRODUODENOSCOPY (EGD);  Surgeon: Ladene Artist, MD;  Location[facility] ENDOSCOPY;  Service: Endoscopy;  Laterality: N/A;  . Colonoscopy with propofol N/A 08/10/2014    Procedure: COLONOSCOPY WITH PROPOFOL;  Surgeon: Ladene Artist, MD;  Location: [facility] ENDOSCOPY;  Service: Endoscopy;  Laterality: N/A;     Family History  Family History  Problem Relation Age of Onset  . Angina Father   . Hypertension Father   . Heart disease Father     MI, angina  . Breast cancer Mother   . Heart disease Mother  Valve disease  . Cancer Brother     Brain tumor  . Colon cancer Neg Hx   . Prostate cancer Neg Hx   . Colon cancer Neg Hx   . Colon polyps Neg Hx   . Kidney disease Neg Hx   . Esophageal cancer Neg Hx     Social History  Social History   Social History  . Marital Status: Married    Spouse Name: N/A  . Number of Children: 2  . Years of Education: N/A   Occupational History  . Retired from [employer]    Social History Main Topics  . Smoking status: Former Smoker    Quit date: 06/17/1938  . Smokeless tobacco: Never Used  . Alcohol Use: 0.0 oz/week    0 Standard drinks or equivalent per week     Comment: Rare  . Drug Use: No  . Sexual Activity: Not on file   Other Topics Concern  . Not on file   Social History Narrative    The patient is retired.  He has been married since age        of 5.  They have 2 children.  They have grandchildren and great        grandchildren.       Review of Systems General:  No chills, fever, night sweats or weight changes. Positive for fatigue. Cardiovascular:  No edema, orthopnea, palpitations, paroxysmal nocturnal dyspnea. Positive for chest pain and dyspnea on exertion. Dermatological: No rash, lesions/masses Respiratory: No cough, dyspnea Urologic: No hematuria, dysuria Abdominal:   No nausea, vomiting,  diarrhea, bright red blood per rectum, melena, or hematemesis. Positive for constipation. Neurologic:  No visual changes, wkns, changes in mental status. All other systems reviewed and are otherwise negative except as noted above.   Physical Exam: Blood pressure 131/74, pulse 72, temperature 97.4 F (36.3 C), temperature source Oral, resp. rate 16, SpO2 98 %.  General: Well developed, elderly Caucasian,male in no acute distress. Head: Normocephalic, atraumatic, sclera non-icteric, no xanthomas, nares are without discharge. Dentition:  Neck: No carotid bruits. JVD not elevated.  Lungs: Respirations regular and unlabored, without wheezes or rales.  Heart: Regular rate and rhythm. No S3 or S4.  No murmur, no rubs, or gallops appreciated. ICD on right. Abdomen: Soft, diffusely tender, non-distended with normoactive bowel sounds. No hepatomegaly. No rebound/guarding. No obvious abdominal masses. Msk:  Strength and tone appear normal for age. No joint deformities or effusions. Extremities: No clubbing or cyanosis. No edema.  Distal pedal pulses are 2+ bilaterally. Neuro: Alert and oriented X 3. Moves all extremities spontaneously. No focal deficits noted. Psych:  Responds to questions appropriately with a normal affect. Skin: No rashes or lesions noted   Labs Lab Results  Component Value Date   WBC 9.7 05/29/2015   HGB 12.1* 05/29/2015   HCT 36.8* 05/29/2015   MCV 88.7 05/29/2015   PLT 190 05/29/2015     Recent Labs Lab 05/29/15 0415  NA 133*  K 6.2*  CL 106  CO2 19*  BUN 38*  CREATININE 2.73*  CALCIUM 8.2*  GLUCOSE 218*   Troponin (Point of Care Test)  Recent Labs  05/29/15 0431  TROPIPOC 0.71*    Recent Labs  05/29/15 0415 05/29/15 0820  TROPONINI 0.52* 2.79*   No results for input(s): INR in the last 72 hours. Lab Results  Component Value Date   CHOL 145 12/28/2014   HDL 37.90* 12/28/2014   LDLCALC 66  10/02/2010   TRIG 211.0* 12/28/2014   No results found  for: BNP PRO B NATRIURETIC PEPTIDE (BNP)  Date/Time Value Ref Range Status  11/21/2008 04:06 AM 2014.0* 0.0 - 100.0 pg/mL Final  07/10/2008 12:20 PM 126.0* 0.0 - 100.0 pg/mL Final   No results found for: DDIMER   ECG: V-paced with rate in 80's. LBBB (old). No acute changes since previous tracing.  Echo: Study Conclusions - Left ventricle: The cavity size was mildly dilated. Wall thickness was increased in a pattern of moderate LVH. The estimated ejection fraction was 20%. Diffuse hypokinesis. Doppler parameters are consistent with abnormal left ventricular relaxation (grade 1 diastolic dysfunction). - Aortic valve: There was no stenosis. - Aorta: Mildly dilated aortic root. Aortic root dimension: 37 mm (ED). - Mitral valve: Mildly calcified annulus. There was no significant regurgitation. - Left atrium: The atrium was mildly dilated. - Right ventricle: Pacer wire or catheter noted in right ventricle. Systolic function was moderately reduced. - Pulmonary arteries: No complete TR doppler jet so unable to estimate PA systolic pressure. - Inferior vena cava: The vessel was normal in size. The respirophasic diameter changes were in the normal range (>= 50%), consistent with normal central venous pressure.  Impressions: - Mildly dilated LV with EF 20%, diffuse hypokinesis. Moderate LV hypertrophy. Normal RV size with moderately decreased systolic function. No significant valvular abnormalities.  Radiology/Studies: Dg Chest 2 View: 05/29/2015  CLINICAL DATA:  RIGHT-sided chest pain, admitted for pneumonia last week. EXAM: CHEST  2 VIEW COMPARISON:  Chest radiograph May 22, 2015 FINDINGS: Cardiac silhouette is upper limits of normal in size, mediastinal silhouette is nonsuspicious. Increased lung volumes with flattened hemidiaphragms compatible with COPD. LEFT mid and lower lung zone strandy densities without pleural effusion or focal consolidation. No  pneumothorax. Dual lead LEFT cardiac defibrillator in situ. Status post median sternotomy. Soft tissue planes and included osseous structures are nonsuspicious. Surgical clips in the abdomen. IMPRESSION: LEFT mid lung zone atelectasis/ scarring. Electronically Signed   By: Elon Alas M.D.   On: 05/29/2015 05:20     ASSESSMENT AND PLAN:   1. NSTEMI (non-ST elevated myocardial infarction) ([place]) - history of CAD s/p CABG in 2010, LIMA-LAD, SVG-1st Diagonal, SVG-OM, SVG-Posterior Descending of the RCA - chest pain awakening him from sleep overnight. Associated with shortness of breath. Chest pain initially 6/10, now 2/10. -  initial troponin value was elevated to 0.52, and a repeat value is elevated to 2.79. - will be taken urgently to the catheterization lab for possible PCI due to continuing active chest pain and rising troponin values. - continue ASA, statin, BB, and ACE-I.   2. Ischemic cardiomyopathy - EF 20% by recent echo s/p ICD placement 2011 - continue ACE-I and BB.  3. Chronic combined systolic and diastolic CHF - EF of 123456, Diffuse hypokinesis, and Grade 1 DD on recent echo in 05/2015. - does not appear volume overloaded on exam. - continue ACE-I and BB.  4. Stage 4 CKD  - s/p left partial nephrectomy in 2010 due to [place] - baseline 2.5 - 2.7. Creatinine 2.73 on 05/29/2015. - monitor with daily BMET  5. HTN - BP has been 94/47 - 132/87 while in the ED. - continue ACE-I.  6. HLD - continue statin  7. Type 2 DM  - continue Lantus 30U nightly  8. Hyperkalemia - K+ 6.2 on admission. Kayexalate administered - monitor with daily BMET   Signed, Erma Heritage, PA-C 05/29/2015, 9:39 AM Pager: [phone]  Attending Note:  The patient was seen and examined.  Agree with assessment and plan as noted above.  Changes made to the above note as needed.  81 yo with hx of CAD   .  Presents with symptoms of MI and abnormal Troponin. Concerning for MI.   I would  favor cath.   I've discussed the risks ( especially reviewed the risk of renal failure ) , benefits, options.   He understands and agrees to proceed.    Thayer Headings, Brooke Bonito., MD, [facility] 05/29/2015, 4:49 PM [address]  [place] Pager [phone]

## 2015-05-29 NOTE — Patient Outreach (Signed)
[facility]) Care Management  05/29/2015  [name] May 27, 1926 [id number]  Telephone call to patient regarding EMMI pneumonia red referral. Unable to reach patient. HIPAA compliant voice message left with call back phone number. Upon chart review, RNCM noted in EPIC patient has been admitted to hospital.    PLAN; RNCM notified Lurline Del and Margretta Ditty of patients admission.  Request hospital liaison be notified to follow up with patient while in patient.   Quinn Plowman RN,BSN,CCM [facility] Coordinator [phone]

## 2015-05-29 NOTE — ED Notes (Signed)
Cardiology PA at bedside. 

## 2015-05-29 NOTE — Progress Notes (Signed)
Pt started sneezing/coughing and vomited 200-300cc tan-brown colored water. Denied having any nausea prior to vomiting. Pt declined to eat dinner. Offered fluids --only wanted water at this time. Denied any nausea post emesis.  Will monitor.  Dr Acie Fredrickson in about 15 minutes before and made aware of reflux/indigestion, he was already aware of elevated troponin at 10.75.

## 2015-05-29 NOTE — Care Management Note (Signed)
Case Management Note  Patient Details  Name: [name] MRN: [id number] Date of Birth: [birth year]  Subjective/Objective:     Adm w nstemi               Action/Plan: lives w fam, pcp dr g Damita Dunnings   Expected Discharge Date:                  Expected Discharge Plan:     In-House Referral:     Discharge planning Services     Post Acute Care Choice:    Choice offered to:     DME Arranged:    DME Agency:     HH Arranged:    [place] Agency:     Status of Service:     Medicare Important Message Given:    Date Medicare IM Given:    Medicare IM give by:    Date Additional Medicare IM Given:    Additional Medicare Important Message give by:     If discussed at [place] of Stay Meetings, dates discussed:    Additional Comments: ur review done  Lacretia Leigh, RN 05/29/2015, 12:50 PM

## 2015-05-29 NOTE — Progress Notes (Signed)
ANTICOAGULATION CONSULT NOTE - Initial Consult  Pharmacy Consult for Heparin Indication: chest pain/ACS  Allergies  Allergen Reactions  . Celecoxib     REACTION: myalgia: abdominal pain  . Kayexalate [Polystyrene] Other (See Comments)    constipation  . Nsaids     REACTION: renal disease  . Rosuvastatin     REACTION: myalgia: musle pain    Patient Measurements:   Heparin Dosing Weight: 95 kg  Vital Signs: Temp: 97.2 F (36.2 C) (12/12 0423) Temp Source: Oral (12/12 0423) BP: 113/70 mmHg (12/12 0423) Pulse Rate: 84 (12/12 0427)  Labs:  Recent Labs  05/29/15 0415  HGB 12.1*  HCT 36.8*  PLT 190    Estimated Creatinine Clearance: 25.1 mL/min (by C-G formula based on Cr of 2.34).   Medical History: Past Medical History  Diagnosis Date  . HTN (hypertension) (06/17/1981)  . Diverticula of colon (06/18/1983)  . Renal insufficiency   . Hyperlipidemia 06/17/1993)  . Diabetes mellitus  (06/17/1997)  . PUD (peptic ulcer disease) 1960's  . Pancreatitis 01/24 - 07/13/08    ABD CT  fatty liver early pancreatitis  07/12/2008  Pelvic CT Nml 07/12/2008  . Cardiomyopathy     EF 25% Ant Akinesis Global Hypo 3/2-08/18/08  . Thyroid nodule     per Dr. Brantley Stage  . Renal disease     per Dr. Posey Pronto  . Hypogonadism male   . GIB (gastrointestinal bleeding)     admission 07/2014, likely diverticular bleeding  . Arthritis   . Lower GI bleed 08/08/2014  . History of MI (myocardial infarction)     January 2010    Medications:  ASA  Coreg  Vit D  Lantus  Pravachol  Ntg  Levaquin    Assessment: 81 y.o. male with chest pain and elevated cardiac markers for heparin  Goal of Therapy:  Heparin level 0.3-0.7 units/ml Monitor platelets by anticoagulation protocol: Yes   Plan:  Heparin 4000 units IV bolus, then start heparin 1150 units/hr Check heparin level in 8 hours.   Caryl Pina 05/29/2015,4:44 AM

## 2015-05-29 NOTE — ED Notes (Signed)
Per [facility]": Pt had back pain prior to going to sleep,(family tried asper cream for the pain) pt woke up around 11 pm from the pain, and chest pain. Pt does have pacemaker, is trhowing non profusing PVC's. Pt states pain has traveled from back, to shoulder, chest, and arm.   Pt given 324 ASA and 4 nitro tablets with [facility], Pt took 1 nitro at home prior to EMS arrival. Pt given 300 ml of saline. Pt started at 145/100, after the 4th nitro blood pressure dropped as low as 70/30.

## 2015-05-29 NOTE — ED Provider Notes (Signed)
CSN: [id number]     Arrival date & time 05/29/15  0411 History   First MD Initiated Contact with Patient 05/29/15 0415     Chief Complaint  Patient presents with  . Chest Pain     (Consider location/radiation/quality/duration/timing/severity/associated sxs/prior Treatment) Patient is a 81 y.o. male presenting with chest pain. The history is provided by the patient.  Chest Pain He has been having chronic pain in his right shoulder for some time since falling on it. Last night, pain started to move from the back of the shoulder where it had been to the front of the shoulder and then had down into his right arm and also to the right side of his chest. He rated pain at 5/10. There is no associated dyspnea, nausea, diaphoresis. He did take one nitroglycerin which did not help the pain, but it also did not burn under his tongue or give him a headache. EMS was called and they gave her 3 nitroglycerin which did improve the pain slightly, but also dropped his blood pressure 0000000 systolic. He states the pain is currently still 5/10. Of note, he had been in the hospital last week for her pneumonia and had been doing well since going home. He does have a pacemaker in place.  Past Medical History  Diagnosis Date  . HTN (hypertension) (06/17/1981)  . Diverticula of colon (06/18/1983)  . Renal insufficiency   . Hyperlipidemia 06/17/1993)  . Diabetes mellitus  (06/17/1997)  . PUD (peptic ulcer disease) 1960's  . Pancreatitis 01/24 - 07/13/08    ABD CT  fatty liver early pancreatitis  07/12/2008  Pelvic CT Nml 07/12/2008  . Cardiomyopathy     EF 25% Ant Akinesis Global Hypo 3/2-08/18/08  . Thyroid nodule     per Dr. Brantley Stage  . Renal disease     per Dr. Posey Pronto  . Hypogonadism male   . GIB (gastrointestinal bleeding)     admission 07/2014, likely diverticular bleeding  . Arthritis   . Lower GI bleed 08/08/2014  . History of MI (myocardial infarction)     January 2010   Past Surgical History  Procedure  Laterality Date  . Colonoscopy  12/2007    4 mm sigmoid HP polyp, diverticulosis. random biopsies negative for colitis.   . Cardiac catheterization       Severe 3-vessel coronary artery disease.  Ischemic   cardiomyopathy.   . Nephrectomy  01/09/09    Partial left nephrectomy. Clear Cell Renal Cell Carcinoma 01/09/2009  . Cardiac defibrillator placement      ICD-Medtronic .  [id number]  . Laparotomy       small mid 80's (laparotomy), 09/12/2000 (no surgery), 10/20/2008 (no surgery)  . Lesion removal      RLE 11/14/2006 (Dr Hulen Skains)   . Ct of abdomen  06/1997    Fatty process unchanged - myelolipoma adrenal unchangd  . Ct of abdomen  02/99    Adrenal mass unchanged, no further follow up required  . Sbo  08/2000 no surgery   10/20/08 no surgery    Mid 80's laparotomy  . Esophagogastroduodenoscopy  07/12/2008    Schatzki Ring 2 cm. HH (Dr. Deatra Ina)  . Abdominal US  07/12/08    Fatty infiltrate liver complex cyst in lower pole left kidney  . Icd/ crt (lv lead disabled)      Medtronic [id number]  . Coronary artery bypass graft  09/05/08     Mediastinotomy, extracorporeal circulation,   coronary artery bypass graft surgery x4 using a  left internal mammary  artery graft to the left anterior descending coronary artery, with a  saphenous vein graft to the first diagonal branch of the LAD, a   saphenous vein graft to the obtuse marginal branch of the left   circumflex coronary artery,  Dr. Cyndia Bent  . Cholecystectomy  1970's  . Esophagogastroduodenoscopy N/A 08/09/2014    Procedure: ESOPHAGOGASTRODUODENOSCOPY (EGD);  Surgeon: Ladene Artist, MD;  Location: [facility] ENDOSCOPY;  Service: Endoscopy;  Laterality: N/A;  . Colonoscopy with propofol N/A 08/10/2014    Procedure: COLONOSCOPY WITH PROPOFOL;  Surgeon: Ladene Artist, MD;  Location: [facility] ENDOSCOPY;  Service: Endoscopy;  Laterality: N/A;   Family History  Problem Relation Age of Onset  . Angina Father   . Hypertension Father   . Heart disease Father      MI, angina  . Breast cancer Mother   . Heart disease Mother     Valve disease  . Cancer Brother     Brain tumor  . Colon cancer Neg Hx   . Prostate cancer Neg Hx   . Colon cancer Neg Hx   . Colon polyps Neg Hx   . Kidney disease Neg Hx   . Esophageal cancer Neg Hx    Social History  Substance Use Topics  . Smoking status: Former Smoker    Quit date: 06/17/1938  . Smokeless tobacco: Never Used  . Alcohol Use: 0.0 oz/week    0 Standard drinks or equivalent per week     Comment: Rare    Review of Systems  Cardiovascular: Positive for chest pain.  All other systems reviewed and are negative.     Allergies  Celecoxib; Kayexalate; Nsaids; and Rosuvastatin  Home Medications   Prior to Admission medications   Medication Sig Start Date End Date Taking? Authorizing Provider  aspirin 81 MG tablet Take 81 mg by mouth at bedtime.     Historical Provider, MD  carvedilol (COREG) 6.25 MG tablet Take by mouth. 6.25mg  twice a day    Historical Provider, MD  Cholecalciferol (VITAMIN D) 2000 UNITS tablet Take 2,000 Units by mouth daily.    Historical Provider, MD  Insulin Glargine (LANTUS) 100 UNIT/ML Solostar Pen Inject 30 Units into the skin daily at 10 pm.     Historical Provider, MD  levofloxacin (LEVAQUIN) 750 MG tablet Take 1 tablet (750 mg total) by mouth every other day. For 4days 05/23/15   Domenic Polite, MD  nitroGLYCERIN (NITROSTAT) 0.4 MG SL tablet Place 1 tablet (0.4 mg total) under the tongue every 5 (five) minutes as needed. 08/05/14   Tonia Ghent, MD  Nutritional Supplements (GLUCERNA SHAKE PO) Take 4 oz by mouth daily after breakfast.      Historical Provider, MD  pravastatin (PRAVACHOL) 40 MG tablet TAKE 1 TABLET BY MOUTH AT BEDTIME 01/23/15   Tonia Ghent, MD  testosterone cypionate (DEPOTESTOTERONE CYPIONATE) 200 MG/ML injection INJECT 1.25ML INTO THE MUSCLE EVERY 28 DAYS 06/30/14   Tonia Ghent, MD   BP 113/70 mmHg  Pulse 84  Temp(Src) 97.2 F (36.2 C) (Oral)   Resp 22  SpO2 100% Physical Exam  Nursing note and vitals reviewed.  81 year old male, resting comfortably and in no acute distress. Vital signs are significant for tachypnea. Oxygen saturation is 100%, which is normal. Head is normocephalic and atraumatic. PERRLA, EOMI. Oropharynx is clear. Neck is nontender and supple without adenopathy or JVD. Back is nontender and there is no CVA tenderness. Lungs are clear without rales,  wheezes, or rhonchi. Chest is nontender. Heart has regular rate and rhythm without murmur. Abdomen is soft, flat, nontender without masses or hepatosplenomegaly and peristalsis is normoactive. Small umbilical hernia is present which is easily reducible. Extremities have no cyanosis or edema, full range of motion is present. Skin is warm and dry without rash. Neurologic: Mental status is normal, cranial nerves are intact, there are no motor or sensory deficits.  ED Course  Procedures (including critical care time) Labs Review Results for orders placed or performed during the hospital encounter of 123456  Basic metabolic panel  Result Value Ref Range   Sodium 133 (L) 135 - 145 mmol/L   Potassium 6.2 (HH) 3.5 - 5.1 mmol/L   Chloride 106 101 - 111 mmol/L   CO2 19 (L) 22 - 32 mmol/L   Glucose, Bld 218 (H) 65 - 99 mg/dL   BUN 38 (H) 6 - 20 mg/dL   Creatinine, Ser 2.73 (H) 0.61 - 1.24 mg/dL   Calcium 8.2 (L) 8.9 - 10.3 mg/dL   GFR calc non Af Amer 19 (L) >60 mL/min   GFR calc Af Amer 23 (L) >60 mL/min   Anion gap 8 5 - 15  CBC  Result Value Ref Range   WBC 9.7 4.0 - 10.5 K/uL   RBC 4.15 (L) 4.22 - 5.81 MIL/uL   Hemoglobin 12.1 (L) 13.0 - 17.0 g/dL   HCT 36.8 (L) 39.0 - 52.0 %   MCV 88.7 78.0 - 100.0 fL   MCH 29.2 26.0 - 34.0 pg   MCHC 32.9 30.0 - 36.0 g/dL   RDW 14.6 11.5 - 15.5 %   Platelets 190 150 - 400 K/uL  Differential  Result Value Ref Range   Neutrophils Relative % 67 %   Neutro Abs 6.3 1.7 - 7.7 K/uL   Lymphocytes Relative 16 %   Lymphs  Abs 1.5 0.7 - 4.0 K/uL   Monocytes Relative 13 %   Monocytes Absolute 1.3 (H) 0.1 - 1.0 K/uL   Eosinophils Relative 4 %   Eosinophils Absolute 0.4 0.0 - 0.7 K/uL   Basophils Relative 0 %   Basophils Absolute 0.0 0.0 - 0.1 K/uL  Troponin I  Result Value Ref Range   Troponin I 0.52 (HH) <0.031 ng/mL  I-stat troponin, ED (not at [facility], [facility])  Result Value Ref Range   Troponin i, poc 0.71 (HH) 0.00 - 0.08 ng/mL   Comment NOTIFIED PHYSICIAN    Comment 3            Imaging Review Dg Chest 2 View  05/29/2015  CLINICAL DATA:  RIGHT-sided chest pain, admitted for pneumonia last week. EXAM: CHEST  2 VIEW COMPARISON:  Chest radiograph May 22, 2015 FINDINGS: Cardiac silhouette is upper limits of normal in size, mediastinal silhouette is nonsuspicious. Increased lung volumes with flattened hemidiaphragms compatible with COPD. LEFT mid and lower lung zone strandy densities without pleural effusion or focal consolidation. No pneumothorax. Dual lead LEFT cardiac defibrillator in situ. Status post median sternotomy. Soft tissue planes and included osseous structures are nonsuspicious. Surgical clips in the abdomen. IMPRESSION: LEFT mid lung zone atelectasis/ scarring. Electronically Signed   By: Elon Alas M.D.   On: 05/29/2015 05:20   I have personally reviewed and evaluated these images and lab results as part of my medical decision-making.   EKG Interpretation   Date/Time:  Monday May 29 2015 04:14:49 EST Ventricular Rate:  84 PR Interval:  229 QRS Duration: 145 QT Interval:  380 QTC Calculation:  449 R Axis:   -84 Text Interpretation:  VENTRICULAR PACED RHYTHM When compared with ECG of  05/21/2015, No significant change was found Confirmed by [facility]  MD, Sheela Mcculley  ([id number]) on 05/29/2015 4:19:28 AM      CRITICAL CARE Performed by: [id number] Total critical care time: 50 minutes Critical care time was exclusive of separately billable procedures and treating other  patients. Critical care was necessary to treat or prevent imminent or life-threatening deterioration. Critical care was time spent personally by me on the following activities: development of treatment plan with patient and/or surrogate as well as nursing, discussions with consultants, evaluation of patient's response to treatment, examination of patient, obtaining history from patient or surrogate, ordering and performing treatments and interventions, ordering and review of laboratory studies, ordering and review of radiographic studies, pulse oximetry and re-evaluation of patient's condition.  MDM   Final diagnoses:  Non-STEMI (non-ST elevated myocardial infarction) (HCC)  Renal insufficiency  Hyperkalemia  Normochromic normocytic anemia    Chest pain of uncertain cause. ECG is not helpful because of underlying pacemaker. Troponin is come back mildly elevated so they started on heparin. Because of his hypotensive response to nitroglycerin, nitroglycerin drip was not started. Old records were reviewed and he has a history of ischemic cardiomyopathy with ejection fraction of 25-30%. He did have recent hospitalization for community-acquired pneumonia.  Troponin is come back elevated indicating this is a non-STEMI. Heparin is started. Renal insufficiency is noted along with hyperkalemia. No evidence of cardiotoxicity from hyperkalemia and it is treated with glucose, insulin, albuterol. He is given morphine for pain with significant relief. Case is discussed with Dr. Susy Manor of cardiology service who agrees to come to evaluate the patient for admission.  Delora Fuel, MD [id number] [id number]

## 2015-05-29 NOTE — Progress Notes (Signed)
Site area: right groin fa sheath Site Prior to Removal:  Level  0 Pressure Applied For:  20 minutes Manual:   yes Patient Status During Pull:  stable Post Pull Site:  Level  0 Post Pull Instructions Given:  yes Post Pull Pulses Present: yes Dressing Applied:  tegaderm Bedrest begins @  [id number] Comments:

## 2015-05-30 ENCOUNTER — Inpatient Hospital Stay (HOSPITAL_COMMUNITY): Payer: Medicare Other

## 2015-05-30 DIAGNOSIS — N289 Disorder of kidney and ureter, unspecified: Secondary | ICD-10-CM

## 2015-05-30 DIAGNOSIS — I2510 Atherosclerotic heart disease of native coronary artery without angina pectoris: Secondary | ICD-10-CM

## 2015-05-30 DIAGNOSIS — I10 Essential (primary) hypertension: Secondary | ICD-10-CM

## 2015-05-30 LAB — CBC
HEMATOCRIT: 39.4 % (ref 39.0–52.0)
HEMOGLOBIN: 12.8 g/dL — AB (ref 13.0–17.0)
MCH: 29.1 pg (ref 26.0–34.0)
MCHC: 32.5 g/dL (ref 30.0–36.0)
MCV: 89.5 fL (ref 78.0–100.0)
Platelets: 206 10*3/uL (ref 150–400)
RBC: 4.4 MIL/uL (ref 4.22–5.81)
RDW: 14.9 % (ref 11.5–15.5)
WBC: 12.1 10*3/uL — AB (ref 4.0–10.5)

## 2015-05-30 LAB — BASIC METABOLIC PANEL
ANION GAP: 9 (ref 5–15)
BUN: 38 mg/dL — AB (ref 6–20)
CO2: 22 mmol/L (ref 22–32)
Calcium: 8.5 mg/dL — ABNORMAL LOW (ref 8.9–10.3)
Chloride: 106 mmol/L (ref 101–111)
Creatinine, Ser: 2.97 mg/dL — ABNORMAL HIGH (ref 0.61–1.24)
GFR, EST AFRICAN AMERICAN: 20 mL/min — AB (ref >60)
GFR, EST NON AFRICAN AMERICAN: 18 mL/min — AB (ref >60)
Glucose, Bld: 158 mg/dL — ABNORMAL HIGH (ref 65–99)
POTASSIUM: 5.3 mmol/L — AB (ref 3.5–5.1)
SODIUM: 137 mmol/L (ref 135–145)

## 2015-05-30 LAB — TROPONIN I: TROPONIN I: 20.65 ng/mL — AB (ref <0.031)

## 2015-05-30 LAB — HEPARIN LEVEL (UNFRACTIONATED)
HEPARIN UNFRACTIONATED: 0.36 [IU]/mL (ref 0.30–0.70)
Heparin Unfractionated: 0.31 IU/mL (ref 0.30–0.70)

## 2015-05-30 LAB — GLUCOSE, CAPILLARY
GLUCOSE-CAPILLARY: 189 mg/dL — AB (ref 65–99)
Glucose-Capillary: 130 mg/dL — ABNORMAL HIGH (ref 65–99)
Glucose-Capillary: 166 mg/dL — ABNORMAL HIGH (ref 65–99)
Glucose-Capillary: 142 mg/dL — ABNORMAL HIGH (ref 65–99)

## 2015-05-30 LAB — LIPID PANEL
CHOL/HDL RATIO: 3.4 ratio
CHOLESTEROL: 142 mg/dL (ref 0–200)
HDL: 42 mg/dL (ref >40)
LDL Cholesterol: 76 mg/dL (ref 0–99)
TRIGLYCERIDES: 120 mg/dL (ref <150)
VLDL: 24 mg/dL (ref 0–40)

## 2015-05-30 MED ORDER — INSULIN ASPART 100 UNIT/ML ~~LOC~~ SOLN
0.0000 [IU] | Freq: Three times a day (TID) | SUBCUTANEOUS | Status: DC
  Administered 2015-05-30 (×2): 3 [IU] via SUBCUTANEOUS
  Administered 2015-05-31: 2 [IU] via SUBCUTANEOUS
  Administered 2015-06-01: 5 [IU] via SUBCUTANEOUS
  Administered 2015-06-01 – 2015-06-03 (×5): 3 [IU] via SUBCUTANEOUS
  Administered 2015-06-04: 2 [IU] via SUBCUTANEOUS
  Administered 2015-06-04: 5 [IU] via SUBCUTANEOUS
  Administered 2015-06-04: 3 [IU] via SUBCUTANEOUS
  Administered 2015-06-05: 2 [IU] via SUBCUTANEOUS
  Administered 2015-06-05: 5 [IU] via SUBCUTANEOUS

## 2015-05-30 NOTE — Progress Notes (Signed)
  noted to be eating fast that he starts to cough. Instructed  to eat slowly and chew his food.noted to be doing good with it. Continue to monitor.

## 2015-05-30 NOTE — Progress Notes (Signed)
PROGRESS NOTE  Subjective:   81 y.o. male with past medical history of CAD (s/p CABG in 2010, LIMA-LAD, SVG-1st Diagonal, SVG-OM, SVG-Posterior Descending of the RCA) ischemic cardiomyopathy (EF 20%, s/p ICD placement 2011), chronic combined systolic and diastolic CHF, Stage 4 CKD (s/p left partial nephrectomy in 2010 due to [place]), HTN, HLD, Type 2 DM who presents to [facility] ED on 05/29/2015 for evaluation of chest pain.  Cath yesterday showed severe native CAD.   Patent IMA to LAD and patent SVG to RCA. The 2 other SVGs were occluded.  He has ruled in for MI - Troponin = 20.6 I've kept him on heparin overnight. Was still feeling poorly last night . Creatinine is up a bit 2.7 -->  2.97    Objective:    Vital Signs:   Temp:  [97.4 F (36.3 C)-98 F (36.7 C)] 98 F (36.7 C) (12/13 0744) Pulse Rate:  [0-295] 78 (12/13 0744) Resp:  [0-41] 17 (12/13 0744) BP: (87-153)/(41-87) 121/59 mmHg (12/13 0744) SpO2:  [0 %-100 %] 95 % (12/13 0744) Weight:  [213 lb 3 oz (96.7 kg)-214 lb 15.2 oz (97.5 kg)] 214 lb 15.2 oz (97.5 kg) (12/13 0500)  Last BM Date: 05/29/15   24-hour weight change: Weight change:   Weight trends: Filed Weights   05/30/15 0000 05/30/15 0500  Weight: 213 lb 3 oz (96.7 kg) 214 lb 15.2 oz (97.5 kg)    Intake/Output:  12/12 0701 - 12/13 0700 In: 228 [P.O.:90; I.V.:138] Out: -      Physical Exam: BP 121/59 mmHg  Pulse 78  Temp(Src) 98 F (36.7 C) (Oral)  Resp 17  Wt 214 lb 15.2 oz (97.5 kg)  SpO2 95%  Wt Readings from Last 3 Encounters:  05/30/15 214 lb 15.2 oz (97.5 kg)  05/22/15 210 lb 1.6 oz (95.3 kg)  03/01/15 214 lb (97.07 kg)    General: Vital signs reviewed and noted.   Head: Normocephalic, atraumatic.  Eyes: conjunctivae/corneas clear.  EOM's intact.   Throat: normal  Neck:  normal   Lungs:    clear   Heart:  RR   Abdomen:  Soft, non-tender, non-distended    Extremities: No edema    Neurologic: A&O X3, CN II - XII are grossly  intact.   Psych: Normal     Labs: BMET:  Recent Labs  05/29/15 0415 05/30/15 0030  NA 133* 137  K 6.2* 5.3*  CL 106 106  CO2 19* 22  GLUCOSE 218* 158*  BUN 38* 38*  CREATININE 2.73* 2.97*  CALCIUM 8.2* 8.5*    Liver function tests: No results for input(s): AST, ALT, ALKPHOS, BILITOT, PROT, ALBUMIN in the last 72 hours. No results for input(s): LIPASE, AMYLASE in the last 72 hours.  CBC:  Recent Labs  05/29/15 0415 05/30/15 0030  WBC 9.7 12.1*  NEUTROABS 6.3  --   HGB 12.1* 12.8*  HCT 36.8* 39.4  MCV 88.7 89.5  PLT 190 206    Cardiac Enzymes:  Recent Labs  05/29/15 0820 05/29/15 1434 05/29/15 1844 05/30/15 0030  TROPONINI 2.79* 10.75* 14.13* 20.65*    Coagulation Studies: No results for input(s): LABPROT, INR in the last 72 hours.  Other: Invalid input(s): POCBNP No results for input(s): DDIMER in the last 72 hours. No results for input(s): HGBA1C in the last 72 hours.  Recent Labs  05/30/15 0030  CHOL 142  HDL 42  LDLCALC 76  TRIG 120  CHOLHDL 3.4   No results  for input(s): TSH, T4TOTAL, T3FREE, THYROIDAB in the last 72 hours.  Invalid input(s): FREET3 No results for input(s): VITAMINB12, FOLATE, FERRITIN, TIBC, IRON, RETICCTPCT in the last 72 hours.   Other results:  EKG  ( personally reviewed )  - NSR with A sensing with V pacing at 79  Medications:    Infusions: . heparin 1,150 Units (05/29/15 1900)    Scheduled Medications: . aspirin EC  81 mg Oral Daily  . carvedilol  6.25 mg Oral BID  . cholecalciferol  2,000 Units Oral Daily  . feeding supplement (GLUCERNA SHAKE)  237 mL Oral QPC breakfast  . insulin glargine  10 Units Subcutaneous Q2200  . lisinopril  2.5 mg Oral Daily  .  morphine injection  4 mg Intravenous Once  . pravastatin  40 mg Oral QHS  . sodium chloride  3 mL Intravenous Q12H    Assessment/ Plan:   Principal Problem:   NSTEMI (non-ST elevated myocardial infarction) ([place]) Active Problems:   HTN  (hypertension)   CKD (chronic kidney disease), stage IV (HCC)   CAD (coronary artery disease)   Ischemic cardiomyopathy   Non-STEMI (non-ST elevated myocardial infarction) ([place])  1. NSTEMI (non-ST elevated myocardial infarction) [facility]) - history of CAD s/p CABG in 2010, LIMA-LAD, SVG-1st Diagonal, SVG-OM, SVG-Posterior Descending of the RCA Cath yesterday showed patent IMA and SVG to the RCA.   The SVG to 1st diag and SVG to OM are occluded  Has rule in for MI .  Continue heparin for another day   Check echo tomorrow to reassess LV function .   2. Ischemic cardiomyopathy - EF 20% by recent echo s/p ICD placement 2011 Will hold ACE-I since creatinine is up a bit.     3. Chronic combined systolic and diastolic CHF - EF of 123456, Diffuse hypokinesis, and Grade 1 DD on recent echo in 05/2015. - does not appear volume overloaded on exam.  continue bb  4. Stage 4 CKD  - s/p left partial nephrectomy in 2010 due to [place] - baseline 2.5 - 2.7.  2.9 today .   5. HTN - BP has been 94/47 - 132/87 while in the ED.    6. HLD - continue statin  7. Type 2 DM  - continue Lantus 30U nightly  8. Hyperkalemia - K+ 6.2 on admission. Kayexalate administered - monitor with daily BMET K = 5.3 this sm.      Disposition: will keep in icu.   Length of Stay: 1  Thayer Headings, Brooke Bonito., MD, [facility] 05/30/2015, 8:18 AM Office [phone] Pager [phone]

## 2015-05-30 NOTE — Progress Notes (Signed)
[place] for Heparin Indication: chest pain/ACS  Allergies  Allergen Reactions  . Celecoxib     REACTION: myalgia: abdominal pain  . Kayexalate [Polystyrene] Other (See Comments)    constipation  . Nsaids     REACTION: renal disease  . Rosuvastatin     REACTION: myalgia: musle pain    Patient Measurements: Weight: 213 lb 3 oz (96.7 kg) Heparin Dosing Weight: 95 kg  Vital Signs: Temp: 97.8 F (36.6 C) (12/12 2327) Temp Source: Oral (12/12 2327) BP: 113/74 mmHg (12/12 2327) Pulse Rate: 83 (12/12 2327)  Labs:  Recent Labs  05/29/15 0415 05/29/15 0820 05/29/15 1434 05/29/15 1844 05/30/15 0030  HGB 12.1*  --   --   --  12.8*  HCT 36.8*  --   --   --  39.4  PLT 190  --   --   --  206  HEPARINUNFRC  --   --   --   --  0.31  CREATININE 2.73*  --   --   --   --   TROPONINI 0.52* 2.79* 10.75* 14.13*  --     Estimated Creatinine Clearance: 23.4 mL/min (by C-G formula based on Cr of 2.73).  Assessment: 81 y.o. male with NSTEMI s/p cath, for medical management of CAD with heparin  Goal of Therapy:  Heparin level 0.3-0.7 units/ml Monitor platelets by anticoagulation protocol: Yes   Plan:  Continue Heparin at current rate Follow-up am labs.    Caryl Pina 05/30/2015,1:08 AM

## 2015-05-30 NOTE — Progress Notes (Signed)
  Echocardiogram 2D Echocardiogram has been performed.  [name] 05/30/2015, 3:28 PM

## 2015-05-30 NOTE — Progress Notes (Signed)
[place] for Heparin Indication: chest pain/ACS  Allergies  Allergen Reactions  . Celecoxib     REACTION: myalgia: abdominal pain  . Kayexalate [Polystyrene] Other (See Comments)    constipation  . Nsaids     REACTION: renal disease  . Rosuvastatin     REACTION: myalgia: musle pain    Patient Measurements: Weight: 214 lb 15.2 oz (97.5 kg) Heparin Dosing Weight: 95 kg  Vital Signs: Temp: 98 F (36.7 C) (12/13 0744) Temp Source: Oral (12/13 0744) BP: 121/59 mmHg (12/13 0744) Pulse Rate: 78 (12/13 0744)  Labs:  Recent Labs  05/29/15 0415  05/29/15 1434 05/29/15 1844 05/30/15 0030 05/30/15 0240  HGB 12.1*  --   --   --  12.8*  --   HCT 36.8*  --   --   --  39.4  --   PLT 190  --   --   --  206  --   HEPARINUNFRC  --   --   --   --  0.31 0.36  CREATININE 2.73*  --   --   --  2.97*  --   TROPONINI 0.52*  < > 10.75* 14.13* 20.65*  --   < > = values in this interval not displayed.  Estimated Creatinine Clearance: 21.5 mL/min (by C-G formula based on Cr of 2.97).  Assessment: 81 y.o. male with NSTEMI s/p cath continuing with medical management. Heparin resumed post cath and plan to continue through 12/13 per cards. Will then re-evaluate. HL therapeutic x2 (0.31, 0.36) at previous rate of 1150 units/hr. CBC stable, no issues.   Goal of Therapy:  Heparin level 0.3-0.7 units/ml Monitor platelets by anticoagulation protocol: Yes   Plan:  Continue heparin at 1150 units/hr Daily HL and CBC Follow anticoag plans Monitor renal fcn, s/sx bleeding    Judieth Keens, PharmD. Clinical Pharmacy Resident 05/30/2015,8:56 AM

## 2015-05-31 DIAGNOSIS — N184 Chronic kidney disease, stage 4 (severe): Secondary | ICD-10-CM

## 2015-05-31 LAB — GLUCOSE, CAPILLARY
GLUCOSE-CAPILLARY: 104 mg/dL — AB (ref 65–99)
GLUCOSE-CAPILLARY: 115 mg/dL — AB (ref 65–99)
GLUCOSE-CAPILLARY: 138 mg/dL — AB (ref 65–99)
Glucose-Capillary: 101 mg/dL — ABNORMAL HIGH (ref 65–99)
Glucose-Capillary: 175 mg/dL — ABNORMAL HIGH (ref 65–99)

## 2015-05-31 LAB — BASIC METABOLIC PANEL
Anion gap: 7 (ref 5–15)
BUN: 52 mg/dL — AB (ref 6–20)
CHLORIDE: 106 mmol/L (ref 101–111)
CO2: 22 mmol/L (ref 22–32)
CREATININE: 3.35 mg/dL — AB (ref 0.61–1.24)
Calcium: 8.1 mg/dL — ABNORMAL LOW (ref 8.9–10.3)
GFR calc Af Amer: 18 mL/min — ABNORMAL LOW (ref >60)
GFR calc non Af Amer: 15 mL/min — ABNORMAL LOW (ref >60)
GLUCOSE: 120 mg/dL — AB (ref 65–99)
Potassium: 5.2 mmol/L — ABNORMAL HIGH (ref 3.5–5.1)
Sodium: 135 mmol/L (ref 135–145)

## 2015-05-31 LAB — CBC
HEMATOCRIT: 35.2 % — AB (ref 39.0–52.0)
Hemoglobin: 11.4 g/dL — ABNORMAL LOW (ref 13.0–17.0)
MCH: 29 pg (ref 26.0–34.0)
MCHC: 32.4 g/dL (ref 30.0–36.0)
MCV: 89.6 fL (ref 78.0–100.0)
PLATELETS: 154 10*3/uL (ref 150–400)
RBC: 3.93 MIL/uL — ABNORMAL LOW (ref 4.22–5.81)
RDW: 14.9 % (ref 11.5–15.5)
WBC: 13.9 10*3/uL — ABNORMAL HIGH (ref 4.0–10.5)

## 2015-05-31 LAB — HEPARIN LEVEL (UNFRACTIONATED): HEPARIN UNFRACTIONATED: 0.14 [IU]/mL — AB (ref 0.30–0.70)

## 2015-05-31 MED ORDER — ENOXAPARIN SODIUM 30 MG/0.3ML ~~LOC~~ SOLN
30.0000 mg | SUBCUTANEOUS | Status: DC
  Administered 2015-05-31 – 2015-06-04 (×5): 30 mg via SUBCUTANEOUS
  Filled 2015-05-31 (×5): qty 0.3

## 2015-05-31 MED ORDER — SODIUM CHLORIDE 0.9 % IV BOLUS (SEPSIS)
500.0000 mL | Freq: Once | INTRAVENOUS | Status: AC
  Administered 2015-05-31: 500 mL via INTRAVENOUS

## 2015-05-31 NOTE — Progress Notes (Signed)
BP 89/47 both arms, CBg-101, lethargic but arousable, PA made aware,cancelled transfer to telemetry unit, bolused with ns 500 ml. Now awake and alert. Latest bp-116/67continue to monitor.

## 2015-05-31 NOTE — Progress Notes (Signed)
[place] for Heparin Indication: chest pain/ACS  Allergies  Allergen Reactions  . Celecoxib     REACTION: myalgia: abdominal pain  . Kayexalate [Polystyrene] Other (See Comments)    constipation  . Nsaids     REACTION: renal disease  . Rosuvastatin     REACTION: myalgia: musle pain    Patient Measurements: Height: 6\' 1"  (185.4 cm) Weight: 216 lb 0.8 oz (98 kg) IBW/kg (Calculated) : 79.9 Heparin Dosing Weight: 95 kg  Vital Signs: Temp: 97.5 F (36.4 C) (12/14 0356) Temp Source: Oral (12/14 0356) BP: 102/46 mmHg (12/14 0356) Pulse Rate: 73 (12/13 1700)  Labs:  Recent Labs  05/29/15 0415  05/29/15 1434 05/29/15 1844 05/30/15 0030 05/30/15 0240 05/31/15 0234  HGB 12.1*  --   --   --  12.8*  --  11.4*  HCT 36.8*  --   --   --  39.4  --  35.2*  PLT 190  --   --   --  206  --  154  HEPARINUNFRC  --   --   --   --  0.31 0.36 0.14*  CREATININE 2.73*  --   --   --  2.97*  --   --   TROPONINI 0.52*  < > 10.75* 14.13* 20.65*  --   --   < > = values in this interval not displayed.  Estimated Creatinine Clearance: 21.6 mL/min (by C-G formula based on Cr of 2.97).  Assessment: 81 y.o. male with NSTEMI s/p cath continuing with medical management. Heparin resumed post cath and plan to continue until 12/14 per cards. Heparin level down to subtherapeutic (0.14) on 1150 units/hr. Noted that pt therapeutic x 2 on this same rate. Hgb down to 11.4, plt down to 154. No issues with line or bleeding reported per RN.   Goal of Therapy:  Heparin level 0.3-0.7 units/ml Monitor platelets by anticoagulation protocol: Yes   Plan:  Increase heparin to 1400 units/hr F/u 8 hr heparin level F/u length of therapy with heparin   Sherlon Handing, PharmD, BCPS Clinical pharmacist, pager [phone] 05/31/2015,4:26 AM

## 2015-05-31 NOTE — Progress Notes (Signed)
Wrote order per MI protocol. Attempted to walk with him however pt sts he can't, he is just too weak and that his legs would not hold him. Wife present and sts pt has been like this for up to a week due to having PNA (was in hospital 3 days). Pt seems more appropriate for PT. Please order if you agree. Discussed MI and NTG. Wife voiced understanding.  [id number] Yves Dill CES, ACSM 11:23 AM 05/31/2015

## 2015-05-31 NOTE — Discharge Summary (Signed)
Physician Discharge Summary  [name] [id number] DOB: Nov 12, 1925 DOA: 05/21/2015  PCP: Crawford Givens, MD  Admit date: 05/21/2015 Discharge date: 05/23/2015  Time spent: 45 minutes  Recommendations for Outpatient Follow-up:  1. PCP Dr.Duncan 12/12 2. FU CXR in 4-6weeks   Discharge Diagnoses:  Principal Problem:   CAP (community acquired pneumonia) Active Problems:   DM (diabetes mellitus), type 2, uncontrolled, with renal complications (HCC)   HTN (hypertension)   Chronic systolic congestive heart failure, NYHA class 3 (HCC)   CAD (coronary artery disease)   Renal insufficiency   Anemia   Weakness   Discharge Condition: stable  Diet recommendation: diabetic heart healthy  Filed Weights   05/21/15 0300 05/22/15 2010  Weight: 95.709 kg (211 lb) 95.3 kg (210 lb 1.6 oz)    History of present illness:  Chief Complaint: Weakness  HPI: [name] is a 81 y.o. male with a history of CAD, Systolic CHF, HTN, DM2, Hyperlipidemia, and CRI ( S/P Left partial Nephrectomy due to RCCa in 2010) who presents to the ED with complaints of increased weakness and malaise x 1 week. He has had a nonproductive cough. He denies any fevers or chills. He was evaluated in the ED and found to have a LLL pneumonia. He was placed on IV antibiotics for CAP pneumonia and referred for admission.   Hospital Course:  1. Suspected community acquired pneumonia -treated with IV ROcephin/Zithromax -repeat CXR 12/5 improving -change to PO levaquin  2. Dyspnea/chest discomfort -due to #1 -troponin negative, 2d ECHO unchanged from prior  3. CAD.Ischemic cardiomyopathy/EF of 20-25%/AICD -continue ASA/Coreg/Statin -repeat ECHO unchanged  4. DM -continue lantus, SSI  5. Debility/weakness -due to #1/cardiomyopathy -PT/OT eval completed -declined home health, advised to continue Outpatient PT  6. CKD 4 -stable, stopped lisinopril due to renal insufficiency  Discharge  Exam: Filed Vitals:   05/23/15 0517 05/23/15 0840  BP: 109/60 141/71  Pulse: 66 69  Temp: 98.4 F (36.9 C)   Resp: 20 19    General: AAOx3 Cardiovascular:S1S2/RRR Respiratory: CTAB, improved air movement  Discharge Instructions   Discharge Instructions    Diet - low sodium heart healthy    Complete by:  As directed      Diet Carb Modified    Complete by:  As directed      Increase activity slowly    Complete by:  As directed           Discharge Medication List as of 05/23/2015 10:02 AM    START taking these medications   Details  levofloxacin (LEVAQUIN) 750 MG tablet Take 1 tablet (750 mg total) by mouth every other day. For 4days, Starting 05/23/2015, Until Discontinued, Print      CONTINUE these medications which have NOT CHANGED   Details  carvedilol (COREG) 6.25 MG tablet Take by mouth. 6.25mg  twice a day, Until Discontinued, Historical Med    Cholecalciferol (VITAMIN D) 2000 UNITS tablet Take 2,000 Units by mouth daily., Until Discontinued, Historical Med    Insulin Glargine (LANTUS) 100 UNIT/ML Solostar Pen Inject 30 Units into the skin daily at 10 pm. , Until Discontinued, Historical Med    nitroGLYCERIN (NITROSTAT) 0.4 MG SL tablet Place 1 tablet (0.4 mg total) under the tongue every 5 (five) minutes as needed., Starting 08/05/2014, Until Discontinued, Normal    Nutritional Supplements (GLUCERNA SHAKE PO) Take 4 oz by mouth daily after breakfast.  , Until Discontinued, Historical Med    pravastatin (PRAVACHOL) 40 MG tablet TAKE 1 TABLET BY MOUTH AT  BEDTIME, Normal    testosterone cypionate (DEPOTESTOTERONE CYPIONATE) 200 MG/ML injection INJECT 1.25ML INTO THE MUSCLE EVERY 28 DAYS, Print    aspirin 81 MG tablet Take 81 mg by mouth at bedtime. , Until Discontinued, Historical Med      STOP taking these medications     lisinopril (PRINIVIL,ZESTRIL) 2.5 MG tablet        Allergies  Allergen Reactions  . Celecoxib     REACTION: myalgia: abdominal pain  .  Kayexalate [Polystyrene] Other (See Comments)    constipation  . Nsaids     REACTION: renal disease  . Rosuvastatin     REACTION: myalgia: musle pain   Follow-up Information    Follow up with Crawford Givens, MD. Schedule an appointment as soon as possible for a visit on 05/29/2015.   Specialty:  Family Medicine   Why:  APPOINTMENT:  Monday, 05-29-15 @ 12:15 pm   Contact information:   [address] [place] [address] [phone]        The results of significant diagnostics from this hospitalization (including imaging, microbiology, ancillary and laboratory) are listed below for reference.    Significant Diagnostic Studies: Dg Chest 2 View  05/29/2015  CLINICAL DATA:  RIGHT-sided chest pain, admitted for pneumonia last week. EXAM: CHEST  2 VIEW COMPARISON:  Chest radiograph May 22, 2015 FINDINGS: Cardiac silhouette is upper limits of normal in size, mediastinal silhouette is nonsuspicious. Increased lung volumes with flattened hemidiaphragms compatible with COPD. LEFT mid and lower lung zone strandy densities without pleural effusion or focal consolidation. No pneumothorax. Dual lead LEFT cardiac defibrillator in situ. Status post median sternotomy. Soft tissue planes and included osseous structures are nonsuspicious. Surgical clips in the abdomen. IMPRESSION: LEFT mid lung zone atelectasis/ scarring. Electronically Signed   By: Awilda Metro M.D.   On: 05/29/2015 05:20   Dg Chest 2 View  05/22/2015  CLINICAL DATA:  Left side pneumonia EXAM: CHEST  2 VIEW COMPARISON:  05/21/2015 FINDINGS: Cardiomediastinal silhouette is stable. There is improvement in aeration left base. Minimal residual atelectasis or infiltrate in lingula. No pulmonary edema. Three leads cardiac pacemaker is unchanged in position. Status post CABG. IMPRESSION: There is improvement in aeration left base. Minimal residual atelectasis or infiltrate in lingula. No pulmonary edema. Three leads cardiac  pacemaker is unchanged in position. Electronically Signed   By: Natasha Mead M.D.   On: 05/22/2015 11:01   Dg Chest 2 View  05/21/2015  CLINICAL DATA:  Awakened at 1:15 feeling weak and dyspnea. Sharp substernal pain. EXAM: CHEST  2 VIEW COMPARISON:  03/07/2014 CT.  02/10/2012 radiographs. FINDINGS: Opacity in the lateral left base may represent early infiltrate. The right lung is clear. Pulmonary vasculature is normal. There is unchanged moderate cardiomegaly. There are intact appearances of the transvenous cardiac leads. IMPRESSION: Lateral left base opacity, possibly early infiltrate or atelectasis. Electronically Signed   By: Ellery Plunk M.D.   On: 05/21/2015 04:47   Dg Abd 2 Views  05/21/2015  CLINICAL DATA:  Awakened at 01:15, feeling weakness and dyspnea. Sharp substernal pain. EXAM: ABDOMEN - 2 VIEW COMPARISON:  None. FINDINGS: The bowel gas pattern is normal. There is no evidence of free air. No radio-opaque calculi or other significant radiographic abnormality is seen. IMPRESSION: Negative. Electronically Signed   By: Ellery Plunk M.D.   On: 05/21/2015 04:48    Microbiology: Recent Results (from the past 240 hour(s))  MRSA PCR Screening     Status: None   Collection  Time: 05/29/15 12:05 PM  Result Value Ref Range Status   MRSA by PCR NEGATIVE NEGATIVE Final    Comment:        The GeneXpert MRSA Assay (FDA approved for NASAL specimens only), is one component of a comprehensive MRSA colonization surveillance program. It is not intended to diagnose MRSA infection nor to guide or monitor treatment for MRSA infections.      Labs: Basic Metabolic Panel:  Recent Labs Lab 05/29/15 0415 05/30/15 0030 05/31/15 1005  NA 133* 137 135  K 6.2* 5.3* 5.2*  CL 106 106 106  CO2 19* 22 22  GLUCOSE 218* 158* 120*  BUN 38* 38* 52*  CREATININE 2.73* 2.97* 3.35*  CALCIUM 8.2* 8.5* 8.1*   Liver Function Tests: No results for input(s): AST, ALT, ALKPHOS, BILITOT, PROT, ALBUMIN  in the last 168 hours. No results for input(s): LIPASE, AMYLASE in the last 168 hours. No results for input(s): AMMONIA in the last 168 hours. CBC:  Recent Labs Lab 05/29/15 0415 05/30/15 0030 05/31/15 0234  WBC 9.7 12.1* 13.9*  NEUTROABS 6.3  --   --   HGB 12.1* 12.8* 11.4*  HCT 36.8* 39.4 35.2*  MCV 88.7 89.5 89.6  PLT 190 206 154   Cardiac Enzymes:  Recent Labs Lab 05/29/15 0415 05/29/15 0820 05/29/15 1434 05/29/15 1844 05/30/15 0030  TROPONINI 0.52* 2.79* 10.75* 14.13* 20.65*   BNP: BNP (last 3 results) No results for input(s): BNP in the last 8760 hours.  ProBNP (last 3 results) No results for input(s): PROBNP in the last 8760 hours.  CBG:  Recent Labs Lab 05/30/15 1147 05/30/15 1706 05/30/15 2123 05/31/15 0804 05/31/15 1127  GLUCAP 166* 142* 104* 115* 101*       Signed:  Othar Curto  [facility] 05/31/2015, 4:33 PM

## 2015-05-31 NOTE — Progress Notes (Addendum)
PROGRESS NOTE  Subjective:   81 y.o. male with past medical history of CAD (s/p CABG in 2010, LIMA-LAD, SVG-1st Diagonal, SVG-OM, SVG-Posterior Descending of the RCA) ischemic cardiomyopathy (EF 20%, s/p ICD placement 2011), chronic combined systolic and diastolic CHF, Stage 4 CKD (s/p left partial nephrectomy in 2010 due to [place]), HTN, HLD, Type 2 DM who presents to [facility] ED on 05/29/2015 for evaluation of chest pain.  Cath yesterday showed severe native CAD.   Patent IMA to LAD and patent SVG to RCA. The 2 other SVGs were occluded.  He has ruled in for MI - Troponin = 20.6 I've kept him on heparin overnight. Was still feeling poorly last night . Creatinine is up a bit 2.7 -->  2.97  Not feeling well today    Objective:    Vital Signs:   Temp:  [97.5 F (36.4 C)-99.5 F (37.5 C)] 98 F (36.7 C) (12/14 0745) Pulse Rate:  [73-76] 75 (12/14 0745) Resp:  [11-27] 27 (12/14 0745) BP: (92-130)/(44-80) 108/64 mmHg (12/14 0745) SpO2:  [93 %-100 %] 93 % (12/14 0745) Weight:  [216 lb 0.8 oz (98 kg)] 216 lb 0.8 oz (98 kg) (12/14 0356)  Last BM Date: 05/29/15   24-hour weight change: Weight change: 2 lb 13.9 oz (1.3 kg)  Weight trends: Filed Weights   05/30/15 0000 05/30/15 0500 05/31/15 0356  Weight: 213 lb 3 oz (96.7 kg) 214 lb 15.2 oz (97.5 kg) 216 lb 0.8 oz (98 kg)    Intake/Output:  12/13 0701 - 12/14 0700 In: 732.2 [P.O.:450; I.V.:282.2] Out: 450 [Urine:450]     Physical Exam: BP 108/64 mmHg  Pulse 75  Temp(Src) 98 F (36.7 C) (Oral)  Resp 27  Ht 6\' 1"  (1.854 m)  Wt 216 lb 0.8 oz (98 kg)  BMI 28.51 kg/m2  SpO2 93%  Wt Readings from Last 3 Encounters:  05/31/15 216 lb 0.8 oz (98 kg)  05/22/15 210 lb 1.6 oz (95.3 kg)  03/01/15 214 lb (97.07 kg)    General: Vital signs reviewed and noted.   Head: Normocephalic, atraumatic.  Eyes: conjunctivae/corneas clear.  EOM's intact.   Throat: normal  Neck:  normal   Lungs:  clear   Heart:  RR   Abdomen:   Soft, non-tender, non-distended    Extremities: No edema    Neurologic: A&O X3, CN II - XII are grossly intact.   Psych: Normal     Labs: BMET:  Recent Labs  05/29/15 0415 05/30/15 0030  NA 133* 137  K 6.2* 5.3*  CL 106 106  CO2 19* 22  GLUCOSE 218* 158*  BUN 38* 38*  CREATININE 2.73* 2.97*  CALCIUM 8.2* 8.5*    Liver function tests: No results for input(s): AST, ALT, ALKPHOS, BILITOT, PROT, ALBUMIN in the last 72 hours. No results for input(s): LIPASE, AMYLASE in the last 72 hours.  CBC:  Recent Labs  05/29/15 0415 05/30/15 0030 05/31/15 0234  WBC 9.7 12.1* 13.9*  NEUTROABS 6.3  --   --   HGB 12.1* 12.8* 11.4*  HCT 36.8* 39.4 35.2*  MCV 88.7 89.5 89.6  PLT 190 206 154    Cardiac Enzymes:  Recent Labs  05/29/15 0820 05/29/15 1434 05/29/15 1844 05/30/15 0030  TROPONINI 2.79* 10.75* 14.13* 20.65*    Coagulation Studies: No results for input(s): LABPROT, INR in the last 72 hours.  Other: Invalid input(s): POCBNP No results for input(s): DDIMER in the last 72 hours. No results for input(s):  HGBA1C in the last 72 hours.  Recent Labs  05/30/15 0030  CHOL 142  HDL 42  LDLCALC 76  TRIG 120  CHOLHDL 3.4   No results for input(s): TSH, T4TOTAL, T3FREE, THYROIDAB in the last 72 hours.  Invalid input(s): FREET3 No results for input(s): VITAMINB12, FOLATE, FERRITIN, TIBC, IRON, RETICCTPCT in the last 72 hours.   Other results:  EKG  ( personally reviewed )  - NSR with A sensing with V pacing at 79  Medications:    Infusions:    Scheduled Medications: . aspirin EC  81 mg Oral Daily  . carvedilol  6.25 mg Oral BID  . cholecalciferol  2,000 Units Oral Daily  . enoxaparin (LOVENOX) injection  30 mg Subcutaneous Q24H  . feeding supplement (GLUCERNA SHAKE)  237 mL Oral QPC breakfast  . insulin aspart  0-15 Units Subcutaneous TID WC  . insulin glargine  10 Units Subcutaneous Q2200  . pravastatin  40 mg Oral QHS  . sodium chloride  3 mL  Intravenous Q12H    Assessment/ Plan:   Principal Problem:   NSTEMI (non-ST elevated myocardial infarction) ([place]) Active Problems:   HTN (hypertension)   CKD (chronic kidney disease), stage IV (HCC)   CAD (coronary artery disease)   Ischemic cardiomyopathy   Non-STEMI (non-ST elevated myocardial infarction) ([place])  1. NSTEMI (non-ST elevated myocardial infarction) [facility]) - history of CAD s/p CABG in 2010, LIMA-LAD, SVG-1st Diagonal, SVG-OM, SVG-Posterior Descending of the RCA Cath yesterday showed patent IMA and SVG to the RCA.   The SVG to 1st diag and SVG to OM are occluded  Has ruled in for MI .  Will DC heparin .      2. Ischemic cardiomyopathy - EF 20% by recent echo s/p ICD placement 2011 Will hold ACE-I since creatinine is up a bit.     3. Chronic combined systolic and diastolic CHF - EF of 123456, Diffuse hypokinesis, and Grade 1 DD on recent echo in 05/2015. - does not appear volume overloaded on exam.  continue bb  4. Stage 4 CKD  - s/p left partial nephrectomy in 2010 due to [place] - baseline 2.5 - 2.7.    5. HTN - BP has been 94/47 - 132/87 while in the ED.    6. HLD - continue statin  7. Type 2 DM  - continue Lantus 30U nightly  8. Hyperkalemia - K+ 6.2 on admission. Kayexalate administered - monitor with daily BMET K = 5.3 this sm.      Disposition: will keep in icu.   Length of Stay: 2  Ramond Dial., MD, [facility] 05/31/2015, 8:54 AM Office [phone] Pager [phone]

## 2015-05-31 NOTE — Consult Note (Signed)
   [facility] CM Inpatient Consult   05/31/2015  Odalis Kasel [birth year] [id number] Patient is currently active [up to [place]  with [facility] for chronic disease management services.  Patient was being followed by [facility] Telephonic nurse for the [facility] pneumonia program.  Will continue to follow up for progress and community care management needs as appropriate. Will follow up with care management staff for needs and progress. Of note, [facility] Care Management services does not replace or interfere with any services that are needed or arranged by inpatient case management or social work.  For additional questions or referrals please contact: Natividad Brood, RN BSN [facility] Liaison  [phone] business mobile phone

## 2015-06-01 DIAGNOSIS — I25111 Atherosclerotic heart disease of native coronary artery with angina pectoris with documented spasm: Secondary | ICD-10-CM

## 2015-06-01 LAB — BASIC METABOLIC PANEL
Anion gap: 9 (ref 5–15)
BUN: 59 mg/dL — ABNORMAL HIGH (ref 6–20)
CHLORIDE: 106 mmol/L (ref 101–111)
CO2: 20 mmol/L — AB (ref 22–32)
CREATININE: 3.28 mg/dL — AB (ref 0.61–1.24)
Calcium: 8.2 mg/dL — ABNORMAL LOW (ref 8.9–10.3)
GFR calc non Af Amer: 16 mL/min — ABNORMAL LOW (ref >60)
GFR, EST AFRICAN AMERICAN: 18 mL/min — AB (ref >60)
Glucose, Bld: 142 mg/dL — ABNORMAL HIGH (ref 65–99)
Potassium: 5.2 mmol/L — ABNORMAL HIGH (ref 3.5–5.1)
Sodium: 135 mmol/L (ref 135–145)

## 2015-06-01 LAB — GLUCOSE, CAPILLARY
GLUCOSE-CAPILLARY: 162 mg/dL — AB (ref 65–99)
GLUCOSE-CAPILLARY: 138 mg/dL — AB (ref 65–99)
Glucose-Capillary: 120 mg/dL — ABNORMAL HIGH (ref 65–99)
Glucose-Capillary: 208 mg/dL — ABNORMAL HIGH (ref 65–99)

## 2015-06-01 LAB — CBC
HEMATOCRIT: 34.8 % — AB (ref 39.0–52.0)
HEMOGLOBIN: 11.3 g/dL — AB (ref 13.0–17.0)
MCH: 29.2 pg (ref 26.0–34.0)
MCHC: 32.5 g/dL (ref 30.0–36.0)
MCV: 89.9 fL (ref 78.0–100.0)
Platelets: 155 10*3/uL (ref 150–400)
RBC: 3.87 MIL/uL — ABNORMAL LOW (ref 4.22–5.81)
RDW: 15 % (ref 11.5–15.5)
WBC: 9.6 10*3/uL (ref 4.0–10.5)

## 2015-06-01 NOTE — Progress Notes (Signed)
Patient arrived to the floor from [place]. VSS. Patient oriented to the floor and unit. Patient daughter and wife at the bedside. Patient states no SOB or Chest pain at this time. Call bell within reach. Will continue to monitor.   Domingo Dimes RN

## 2015-06-01 NOTE — Progress Notes (Signed)
PROGRESS NOTE  Subjective:   81 y.o. male with past medical history of CAD (s/p CABG in 2010, LIMA-LAD, SVG-1st Diagonal, SVG-OM, SVG-Posterior Descending of the RCA) ischemic cardiomyopathy (EF 20%, s/p ICD placement 2011), chronic combined systolic and diastolic CHF, Stage 4 CKD (s/p left partial nephrectomy in 2010 due to [place]), HTN, HLD, Type 2 DM who presents to [facility] ED on 05/29/2015 for evaluation of chest pain.  Cath yesterday showed severe native CAD.   Patent IMA to LAD and patent SVG to RCA. The 2 other SVGs were occluded.  He has ruled in for MI - Troponin = 20.6 I've kept him on heparin overnight. Was still feeling poorly last night . Creatinine is up a bit 2.7 -->  2.97 --> 3.35 --> 3.28  Not feeling well today  Creatinine increased slightly and now is on the way down.    Objective:    Vital Signs:   Temp:  [98.3 F (36.8 C)-98.9 F (37.2 C)] 98.5 F (36.9 C) (12/15 0732) Pulse Rate:  [82] 82 (12/14 1615) Resp:  [9-23] 9 (12/15 0732) BP: (89-132)/(48-74) 115/62 mmHg (12/15 0732) SpO2:  [94 %-98 %] 96 % (12/15 0732) Weight:  [217 lb 6 oz (98.6 kg)] 217 lb 6 oz (98.6 kg) (12/15 0352)  Last BM Date: 05/30/15   24-hour weight change: Weight change: 1 lb 5.2 oz (0.6 kg)  Weight trends: Filed Weights   05/30/15 0500 05/31/15 0356 06/01/15 0352  Weight: 214 lb 15.2 oz (97.5 kg) 216 lb 0.8 oz (98 kg) 217 lb 6 oz (98.6 kg)    Intake/Output:  12/14 0701 - 12/15 0700 In: 668 [P.O.:640; I.V.:28] Out: 400 [Urine:400]     Physical Exam: BP 115/62 mmHg  Pulse 82  Temp(Src) 98.5 F (36.9 C) (Oral)  Resp 9  Ht 6\' 1"  (1.854 m)  Wt 217 lb 6 oz (98.6 kg)  BMI 28.69 kg/m2  SpO2 96%  Wt Readings from Last 3 Encounters:  06/01/15 217 lb 6 oz (98.6 kg)  05/22/15 210 lb 1.6 oz (95.3 kg)  03/01/15 214 lb (97.07 kg)    General: Vital signs reviewed and noted.   Head: Normocephalic, atraumatic.  Eyes: conjunctivae/corneas clear.  EOM's intact.     Throat: normal  Neck:  normal   Lungs:  clear   Heart:  RR   Abdomen:  Soft, non-tender, non-distended    Extremities: No edema    Neurologic: A&O X3, CN II - XII are grossly intact.   Psych: Normal     Labs: BMET:  Recent Labs  05/31/15 1005 06/01/15 0308  NA 135 135  K 5.2* 5.2*  CL 106 106  CO2 22 20*  GLUCOSE 120* 142*  BUN 52* 59*  CREATININE 3.35* 3.28*  CALCIUM 8.1* 8.2*    Liver function tests: No results for input(s): AST, ALT, ALKPHOS, BILITOT, PROT, ALBUMIN in the last 72 hours. No results for input(s): LIPASE, AMYLASE in the last 72 hours.  CBC:  Recent Labs  05/31/15 0234 06/01/15 0308  WBC 13.9* 9.6  HGB 11.4* 11.3*  HCT 35.2* 34.8*  MCV 89.6 89.9  PLT 154 155    Cardiac Enzymes:  Recent Labs  05/29/15 0820 05/29/15 1434 05/29/15 1844 05/30/15 0030  TROPONINI 2.79* 10.75* 14.13* 20.65*    Coagulation Studies: No results for input(s): LABPROT, INR in the last 72 hours.  Other: Invalid input(s): POCBNP No results for input(s): DDIMER in the last 72 hours. No results for input(s):  HGBA1C in the last 72 hours.  Recent Labs  05/30/15 0030  CHOL 142  HDL 42  LDLCALC 76  TRIG 120  CHOLHDL 3.4   No results for input(s): TSH, T4TOTAL, T3FREE, THYROIDAB in the last 72 hours.  Invalid input(s): FREET3 No results for input(s): VITAMINB12, FOLATE, FERRITIN, TIBC, IRON, RETICCTPCT in the last 72 hours.   Other results:  EKG  ( personally reviewed )  - NSR with A sensing with V pacing at 79  Medications:    Infusions:    Scheduled Medications: . aspirin EC  81 mg Oral Daily  . carvedilol  6.25 mg Oral BID  . cholecalciferol  2,000 Units Oral Daily  . enoxaparin (LOVENOX) injection  30 mg Subcutaneous Q24H  . feeding supplement (GLUCERNA SHAKE)  237 mL Oral QPC breakfast  . insulin aspart  0-15 Units Subcutaneous TID WC  . insulin glargine  10 Units Subcutaneous Q2200  . pravastatin  40 mg Oral QHS  . sodium chloride  3  mL Intravenous Q12H    Assessment/ Plan:   Principal Problem:   NSTEMI (non-ST elevated myocardial infarction) ([place]) Active Problems:   HTN (hypertension)   CKD (chronic kidney disease), stage IV (HCC)   CAD (coronary artery disease)   Ischemic cardiomyopathy   Non-STEMI (non-ST elevated myocardial infarction) ([place])  1. NSTEMI (non-ST elevated myocardial infarction) ([place]) - history of CAD s/p CABG in 2010, LIMA-LAD, SVG-1st Diagonal, SVG-OM, SVG-Posterior Descending of the RCA Cath   showed patent IMA and SVG to the RCA.   The SVG to 1st diag and SVG to OM are occluded  Has ruled in for MI . There were no clear culprit vessels to open.  Has severe native CAD.  Likely had occlusion of a small branch  Felt poorly yesterday and we kept in the ICU.  Felt better after some IVF. Doing better today , thinks he can walk some. Will transfer to tele      2. Ischemic cardiomyopathy - EF 20% by recent echo s/p ICD placement 2011 Will hold ACE-I since creatinine is up a bit.     3. Chronic combined systolic and diastolic CHF - EF of 123456, Diffuse hypokinesis, and Grade 1 DD on recent echo in 05/2015. - does not appear volume overloaded on exam.  continue bb  4. Stage 4 CKD  - s/p left partial nephrectomy in 2010 due to [place] - baseline 2.5 - 2.7. - 3.3 Creatinine is already coming back down   5. HTN Well controlled. We have held the ACE-I due to his slight worsening renal function I anticipate restarting it once his creatinine is back to baseline of ~ 2.5    6. HLD - continue statin  7. Type 2 DM  - continue Lantus 30U nightly  8. Hyperkalemia - K+ 6.2 on admission. Kayexalate administered - monitor with daily BMET K = 5.3 this sm.      Disposition: transfer to tele. I anticipate DC on Friday or Saturday - depending on how he does with ambulation   Length of Stay: 3  Thayer Headings, Brooke Bonito., MD, [facility] 06/01/2015, 7:57 AM Office [phone] Pager [phone]

## 2015-06-01 NOTE — Progress Notes (Signed)
Pt c/o SOB and chest pain. Pt placed on 2 L O2. EKG taken. Pain unrelieved by SL Nitro x3. BP low, otherwise VSS. MD paged. New orders given. Will continue to monitor closely.  Raliegh Ip RN

## 2015-06-01 NOTE — Care Management Important Message (Signed)
Important Message  Patient Details  Name: [name] MRN: [id number] Date of Birth: 1925-12-16   Medicare Important Message Given:  Yes    [name] G 06/01/2015, 12:11 PM

## 2015-06-02 LAB — CBC
HEMATOCRIT: 31.9 % — AB (ref 39.0–52.0)
Hemoglobin: 10.6 g/dL — ABNORMAL LOW (ref 13.0–17.0)
MCH: 29.6 pg (ref 26.0–34.0)
MCHC: 33.2 g/dL (ref 30.0–36.0)
MCV: 89.1 fL (ref 78.0–100.0)
PLATELETS: 166 10*3/uL (ref 150–400)
RBC: 3.58 MIL/uL — AB (ref 4.22–5.81)
RDW: 14.7 % (ref 11.5–15.5)
WBC: 10.5 10*3/uL (ref 4.0–10.5)

## 2015-06-02 LAB — GLUCOSE, CAPILLARY
GLUCOSE-CAPILLARY: 129 mg/dL — AB (ref 65–99)
GLUCOSE-CAPILLARY: 178 mg/dL — AB (ref 65–99)
Glucose-Capillary: 112 mg/dL — ABNORMAL HIGH (ref 65–99)

## 2015-06-02 MED ORDER — ISOSORBIDE MONONITRATE ER 30 MG PO TB24
30.0000 mg | ORAL_TABLET | Freq: Every day | ORAL | Status: DC
  Administered 2015-06-02 – 2015-06-05 (×4): 30 mg via ORAL
  Filled 2015-06-02 (×4): qty 1

## 2015-06-02 MED ORDER — MORPHINE SULFATE (PF) 4 MG/ML IV SOLN
4.0000 mg | INTRAVENOUS | Status: DC | PRN
  Administered 2015-06-02: 4 mg via INTRAVENOUS
  Filled 2015-06-02: qty 1

## 2015-06-02 MED ORDER — SODIUM CHLORIDE 0.9 % IV BOLUS (SEPSIS)
250.0000 mL | Freq: Once | INTRAVENOUS | Status: AC
  Administered 2015-06-02 (×2): 250 mL via INTRAVENOUS

## 2015-06-02 MED ORDER — MORPHINE SULFATE (PF) 2 MG/ML IV SOLN
1.0000 mg | Freq: Once | INTRAVENOUS | Status: AC
  Administered 2015-06-02: 1 mg via INTRAVENOUS
  Filled 2015-06-02: qty 1

## 2015-06-02 NOTE — Progress Notes (Signed)
PROGRESS NOTE  Subjective:   81 y.o. male with past medical history of CAD (s/p CABG in 2010, LIMA-LAD, SVG-1st Diagonal, SVG-OM, SVG-Posterior Descending of the RCA) ischemic cardiomyopathy (EF 20%, s/p ICD placement 2011), chronic combined systolic and diastolic CHF, Stage 4 CKD (s/p left partial nephrectomy in 2010 due to [place]), HTN, HLD, Type 2 DM who presents to [facility] ED on 05/29/2015 for evaluation of chest pain.  Cath yesterday showed severe native CAD.   Patent IMA to LAD and patent SVG to RCA. The 2 other SVGs were occluded.  He has ruled in for MI - Troponin = 20.6 I've kept him on heparin overnight. Was still feeling poorly last night . Creatinine is up a bit 2.7 -->  2.97 --> 3.35 --> 3.28  Transferred to 2 w. Continues to have intermittant CP.  Gets better with SL NTG but he typically gets hypotensive.   Complains of constipation . Prune juice works at home     Objective:    Vital Signs:   Temp:  [97.9 F (36.6 C)-98.8 F (37.1 C)] 98.1 F (36.7 C) (12/16 0525) Pulse Rate:  [70-92] 73 (12/16 0828) Resp:  [16-18] 18 (12/16 0525) BP: (80-154)/(44-108) 129/59 mmHg (12/16 0828) SpO2:  [94 %-99 %] 96 % (12/16 0828) Weight:  [212 lb 1.3 oz (96.2 kg)-213 lb 10 oz (96.9 kg)] 213 lb 10 oz (96.9 kg) (12/16 0525)  Last BM Date: 05/30/15   24-hour weight change: Weight change: -5 lb 4.7 oz (-2.4 kg)  Weight trends: Filed Weights   06/01/15 0352 06/01/15 1127 06/02/15 0525  Weight: 217 lb 6 oz (98.6 kg) 212 lb 1.3 oz (96.2 kg) 213 lb 10 oz (96.9 kg)    Intake/Output:  12/15 0701 - 12/16 0700 In: 723 [P.O.:720; I.V.:3] Out: 850 [Urine:850]     Physical Exam: BP 129/59 mmHg  Pulse 73  Temp(Src) 98.1 F (36.7 C) (Oral)  Resp 18  Ht 6\' 1"  (1.854 m)  Wt 213 lb 10 oz (96.9 kg)  BMI 28.19 kg/m2  SpO2 96%  Wt Readings from Last 3 Encounters:  06/02/15 213 lb 10 oz (96.9 kg)  05/22/15 210 lb 1.6 oz (95.3 kg)  03/01/15 214 lb (97.07 kg)     General: Vital signs reviewed and noted.   Head: Normocephalic, atraumatic.  Eyes: conjunctivae/corneas clear.  EOM's intact.   Throat: normal  Neck:  normal   Lungs:  clear   Heart:  RR   Abdomen:  Soft, non-tender, non-distended    Extremities: No edema    Neurologic: A&O X3, CN II - XII are grossly intact.   Psych: Normal     Labs: BMET:  Recent Labs  05/31/15 1005 06/01/15 0308  NA 135 135  K 5.2* 5.2*  CL 106 106  CO2 22 20*  GLUCOSE 120* 142*  BUN 52* 59*  CREATININE 3.35* 3.28*  CALCIUM 8.1* 8.2*    Liver function tests: No results for input(s): AST, ALT, ALKPHOS, BILITOT, PROT, ALBUMIN in the last 72 hours. No results for input(s): LIPASE, AMYLASE in the last 72 hours.  CBC:  Recent Labs  06/01/15 0308 06/02/15 0310  WBC 9.6 10.5  HGB 11.3* 10.6*  HCT 34.8* 31.9*  MCV 89.9 89.1  PLT 155 166    Cardiac Enzymes: No results for input(s): CKTOTAL, CKMB, TROPONINI in the last 72 hours.  Coagulation Studies: No results for input(s): LABPROT, INR in the last 72 hours.  Other: Invalid input(s): POCBNP No  results for input(s): DDIMER in the last 72 hours. No results for input(s): HGBA1C in the last 72 hours. No results for input(s): CHOL, HDL, LDLCALC, TRIG, CHOLHDL in the last 72 hours. No results for input(s): TSH, T4TOTAL, T3FREE, THYROIDAB in the last 72 hours.  Invalid input(s): FREET3 No results for input(s): VITAMINB12, FOLATE, FERRITIN, TIBC, IRON, RETICCTPCT in the last 72 hours.   Other results:  EKG  ( personally reviewed )  - NSR with A sensing with V pacing at 79  Medications:    Infusions:    Scheduled Medications: . aspirin EC  81 mg Oral Daily  . carvedilol  6.25 mg Oral BID  . cholecalciferol  2,000 Units Oral Daily  . enoxaparin (LOVENOX) injection  30 mg Subcutaneous Q24H  . feeding supplement (GLUCERNA SHAKE)  237 mL Oral QPC breakfast  . insulin aspart  0-15 Units Subcutaneous TID WC  . insulin glargine  10  Units Subcutaneous Q2200  . isosorbide mononitrate  30 mg Oral Daily  . pravastatin  40 mg Oral QHS  . sodium chloride  3 mL Intravenous Q12H    Assessment/ Plan:   Principal Problem:   NSTEMI (non-ST elevated myocardial infarction) ([place]) Active Problems:   HTN (hypertension)   CKD (chronic kidney disease), stage IV (H[facility])   CAD (coronary artery disease)   Ischemic cardiomyopathy   Non-STEMI (non-ST elevated myocardial infarction) ([place])  1. NSTEMI (non-ST elevated myocardial infarction) ([place]) - history of CAD s/p CABG in 2010, LIMA-LAD, SVG-1st Diagonal, SVG-OM, SVG-Posterior Descending of the RCA Cath   showed patent IMA and SVG to the RCA.   The SVG to 1st diag and SVG to OM are occluded  Has ruled in for MI . There were no clear culprit vessels to open.  Has severe native CAD.  Likely had occlusion of a small branch  Still having angina  Will order morphine and also Imdur 30 a day .   I discussed the fact that we could not fix the CAD and that we may need to consider a Palliative care consult.    2. Ischemic cardiomyopathy - EF 20% by recent echo s/p ICD placement 2011 holsing ACE - I   3. Chronic combined systolic and diastolic CHF - EF of 123456, Diffuse hypokinesis, and Grade 1 DD on recent echo in 05/2015. - does not appear volume overloaded on exam.  continue bb  4. Stage 4 CKD  - s/p left partial nephrectomy in 2010 due to [place] - baseline 2.5 - 2.7. - 3.3 Creatinine is already coming back down   5. HTN Well controlled. We have held the ACE-I due to his slight worsening renal function I anticipate restarting it once his creatinine is back to baseline of ~ 2.5    6. HLD - continue statin  7. Type 2 DM  - continue Lantus 30U nightly   constipation - will give him prune juice and order LOC .    Will need to keep here until he stabilizes   Length of Stay: 4  Thayer Headings, Brooke Bonito., MD, [facility] 06/02/2015, 10:02 AM Office [phone] Pager [phone]

## 2015-06-02 NOTE — Progress Notes (Signed)
   06/02/15 0828  Vital Signs  Pulse Rate 73  BP (!) 129/59 mmHg  BP Location Left Arm  BP Method Automatic  Patient Position (if appropriate) Lying  Oxygen Therapy  SpO2 96 %  O2 Device Room Air   Patient complaining of right shoulder pain, 8/10. PA paged. EKG and vitals obtained. Awaiting new orders.

## 2015-06-02 NOTE — Progress Notes (Signed)
PT Cancellation Note  Patient Details Name: [name] MRN: [id number] DOB: January 12, 1926   Cancelled Treatment:    Reason Eval/Treat Not Completed: Medical issues which prohibited therapy Pt having angina at rest. Palliative care consult pending. Will follow up if appropriate.   Marguarite Arbour A Hobert Poplaski 06/02/2015, 1:27 PM Wray Kearns, [place], DPT [phone]

## 2015-06-03 DIAGNOSIS — K5909 Other constipation: Secondary | ICD-10-CM

## 2015-06-03 DIAGNOSIS — I519 Heart disease, unspecified: Secondary | ICD-10-CM

## 2015-06-03 DIAGNOSIS — R0789 Other chest pain: Secondary | ICD-10-CM

## 2015-06-03 DIAGNOSIS — R079 Chest pain, unspecified: Secondary | ICD-10-CM | POA: Insufficient documentation

## 2015-06-03 DIAGNOSIS — I25708 Atherosclerosis of coronary artery bypass graft(s), unspecified, with other forms of angina pectoris: Secondary | ICD-10-CM

## 2015-06-03 DIAGNOSIS — R0600 Dyspnea, unspecified: Secondary | ICD-10-CM | POA: Insufficient documentation

## 2015-06-03 DIAGNOSIS — I255 Ischemic cardiomyopathy: Secondary | ICD-10-CM

## 2015-06-03 DIAGNOSIS — I5022 Chronic systolic (congestive) heart failure: Secondary | ICD-10-CM

## 2015-06-03 DIAGNOSIS — Z515 Encounter for palliative care: Secondary | ICD-10-CM

## 2015-06-03 DIAGNOSIS — E785 Hyperlipidemia, unspecified: Secondary | ICD-10-CM

## 2015-06-03 LAB — GLUCOSE, CAPILLARY
GLUCOSE-CAPILLARY: 183 mg/dL — AB (ref 65–99)
GLUCOSE-CAPILLARY: 190 mg/dL — AB (ref 65–99)
Glucose-Capillary: 156 mg/dL — ABNORMAL HIGH (ref 65–99)
Glucose-Capillary: 165 mg/dL — ABNORMAL HIGH (ref 65–99)

## 2015-06-03 LAB — BASIC METABOLIC PANEL
Anion gap: 9 (ref 5–15)
BUN: 65 mg/dL — AB (ref 6–20)
CHLORIDE: 104 mmol/L (ref 101–111)
CO2: 21 mmol/L — ABNORMAL LOW (ref 22–32)
CREATININE: 3.04 mg/dL — AB (ref 0.61–1.24)
Calcium: 8.2 mg/dL — ABNORMAL LOW (ref 8.9–10.3)
GFR calc Af Amer: 20 mL/min — ABNORMAL LOW (ref >60)
GFR calc non Af Amer: 17 mL/min — ABNORMAL LOW (ref >60)
GLUCOSE: 167 mg/dL — AB (ref 65–99)
POTASSIUM: 5.1 mmol/L (ref 3.5–5.1)
Sodium: 134 mmol/L — ABNORMAL LOW (ref 135–145)

## 2015-06-03 LAB — CBC
HEMATOCRIT: 30.9 % — AB (ref 39.0–52.0)
Hemoglobin: 10.1 g/dL — ABNORMAL LOW (ref 13.0–17.0)
MCH: 29.2 pg (ref 26.0–34.0)
MCHC: 32.7 g/dL (ref 30.0–36.0)
MCV: 89.3 fL (ref 78.0–100.0)
Platelets: 179 10*3/uL (ref 150–400)
RBC: 3.46 MIL/uL — ABNORMAL LOW (ref 4.22–5.81)
RDW: 14.6 % (ref 11.5–15.5)
WBC: 7.5 10*3/uL (ref 4.0–10.5)

## 2015-06-03 MED ORDER — SENNOSIDES-DOCUSATE SODIUM 8.6-50 MG PO TABS
1.0000 | ORAL_TABLET | Freq: Two times a day (BID) | ORAL | Status: DC
  Administered 2015-06-03 – 2015-06-05 (×4): 1 via ORAL
  Filled 2015-06-03 (×4): qty 1

## 2015-06-03 MED ORDER — MAGNESIUM HYDROXIDE 400 MG/5ML PO SUSP
30.0000 mL | Freq: Every day | ORAL | Status: DC | PRN
  Administered 2015-06-03: 30 mL via ORAL

## 2015-06-03 MED ORDER — BISACODYL 10 MG RE SUPP
10.0000 mg | Freq: Once | RECTAL | Status: AC
  Administered 2015-06-03: 10 mg via RECTAL
  Filled 2015-06-03: qty 1

## 2015-06-03 MED ORDER — MORPHINE SULFATE (CONCENTRATE) 10 MG/0.5ML PO SOLN: 5.0000 mg | ORAL | Status: DC | PRN

## 2015-06-03 NOTE — Progress Notes (Signed)
PT Cancellation Note  Patient Details Name: [name] MRN: [id number] DOB: Oct 11, 1925   Cancelled Treatment:    Reason Eval/Treat Not Completed: Medical issues which prohibited therapy Pt on bedrest with bathroom privileges. Will await increase in activity orders as pt with angina at rest.  [place] 06/03/2015, 8:54 AM Wray Kearns, PT, DPT [phone]

## 2015-06-03 NOTE — Evaluation (Signed)
Physical Therapy Evaluation Patient Details Name: [name] MRN: [id number] DOB: [birth year] Today's Date: 06/03/2015   History of Present Illness  Patient is a 81 y/o male with hx of CAD, systolic CHF, HTN, DM, HLD presents with chest pain and positive enzymes s/p cardiac cath, found to have NSTEMI. Palliative care consult pending.  Clinical Impression  Patient presents with functional limitations due to deficits listed in PT problem list (see below). Pt with generalized weakness, fatigue and balance deficits impacting mobility. Tolerated short distance ambulation with Min A for balance/safety due bil knee instability. Pt will have support from wife at home. Will follow acutely to maximize independence and mobility prior to return home.    Follow Up Recommendations Supervision for mobility/OOB;Home health PT (pending progress)    Equipment Recommendations  None recommended by PT    Recommendations for Other Services       Precautions / Restrictions Precautions Precautions: Fall Restrictions Weight Bearing Restrictions: No      Mobility  Bed Mobility Overal bed mobility: Needs Assistance Bed Mobility: Supine to Sit     Supine to sit: Supervision;HOB elevated     General bed mobility comments: Use of rail, no assist needed.  Transfers Overall transfer level: Needs assistance Equipment used: Rolling walker (2 wheeled) Transfers: Sit to/from Stand Sit to Stand: Min assist         General transfer comment: Min A to boost from EOB with mutliple attempts to stand. Cues for hand placement and anterior translation. transferred to chair post ambulation bout.  Ambulation/Gait Ambulation/Gait assistance: Min assist Ambulation Distance (Feet): 35 Feet Assistive device: Rolling walker (2 wheeled) Gait Pattern/deviations: Step-through pattern;Decreased stride length;Trunk flexed;Decreased stance time - right;Decreased step length - left     General Gait Details: Cues  to decrease gait speed for safety and to self monitor for activity tolerance- fatigue and bil knee instability noted. DOE 3/4. VSS.  Stairs            Wheelchair Mobility    Modified Rankin (Stroke Patients Only)       Balance Overall balance assessment: Needs assistance Sitting-balance support: Feet supported;No upper extremity supported Sitting balance-Leahy Scale: Good     Standing balance support: During functional activity Standing balance-Leahy Scale: Poor Standing balance comment: Relient on RW for support.                              Pertinent Vitals/Pain Pain Assessment: No/denies pain    Home Living Family/patient expects to be discharged to:: Private residence Living Arrangements: Spouse/significant other Available Help at Discharge: Family;Available PRN/intermittently Type of Home: House Home Access: Stairs to enter Entrance Stairs-Rails: Right Entrance Stairs-Number of Steps: 3 Home Layout: Two level;Laundry or work area in basement;Able to live on main level with bedroom/bathroom Home Equipment: Gilmer Mor - single point;Walker - 2 wheels;Shower seat - built in;Grab bars - tub/shower Additional Comments: pt has (A) from wife upon dc     Prior Function Level of Independence: Independent with assistive device(s)         Comments: cane prn, reports using RW more lately due to legs feel weak     Hand Dominance   Dominant Hand: Right    Extremity/Trunk Assessment   Upper Extremity Assessment: Defer to OT evaluation           Lower Extremity Assessment: Generalized weakness      Cervical / Trunk Assessment: Normal  Communication   Communication: No  difficulties  Cognition Arousal/Alertness: Awake/alert Behavior During Therapy: WFL for tasks assessed/performed Overall Cognitive Status: Within Functional Limits for tasks assessed                      General Comments General comments (skin integrity, edema, etc.): Wife  and daughter present during session.    Exercises        Assessment/Plan    PT Assessment Patient needs continued PT services  PT Diagnosis Difficulty walking;Generalized weakness   PT Problem List Decreased strength;Decreased activity tolerance;Decreased balance;Decreased mobility;Cardiopulmonary status limiting activity;Decreased safety awareness  PT Treatment Interventions     PT Goals (Current goals can be found in the Care Plan section) Acute Rehab PT Goals Patient Stated Goal: feel better and go home PT Goal Formulation: With patient Time For Goal Achievement: 06/17/15 Potential to Achieve Goals: Good    Frequency Min 3X/week   Barriers to discharge Inaccessible home environment steps to enter home    Co-evaluation               End of Session Equipment Utilized During Treatment: Gait belt Activity Tolerance: Patient limited by fatigue Patient left: in chair;with call bell/phone within reach;with family/visitor present Nurse Communication: Mobility status         Time: 2440-1027 PT Time Calculation (min) (ACUTE ONLY): 26 min   Charges:   PT Evaluation $Initial PT Evaluation Tier I: 1 Procedure PT Treatments $Gait Training: 8-22 mins   PT G Codes:        Ayani Ospina A Dream [place] 06/03/2015, 3:07 PM Mylo Red, PT, DPT [phone]

## 2015-06-03 NOTE — Consult Note (Signed)
Consultation Note Date: 06/03/2015   Patient Name: [name]  DOB: [birth year]  MRN: [id number]  Age / Sex: 81 y.o., male  PCP: Tonia Ghent, MD Referring Physician: Thayer Headings, MD  Reason for Consultation: Establishing goals of care  Life limiting illness: Severe coronary artery disease, ischemic cardiomyopathy.  Clinical Assessment/Narrative: Patient is a 81 year old gentleman who lives at home with his wife. He has 2 daughters were present at the bedside. Patient has a past medical history significant for severe coronary artery disease. He is status post CABG in the past. Patient's daughter states that he had pneumonia 2 weeks ago. He has had generalized weakness. In April he had a fall and concussion this year. He has underlying severe degenerative joint disease bilaterally both knees. He is noted to be not a candidate for joint replacement because of his heart condition. The context of this hospitalization is such that the patient has been diagnosed with non-ST segment elevation MI. He has ruled in. He has undergone cardiac catheterization showing severe native coronary artery disease. He has stage IV chronic kidney disease. He has ischemic cardiomyopathy ejection fraction less than 20%. It is reported that family members requested palliative care consultation  Patient is resting in bed. Wife and 2 daughters recommended discussions outside the room. Patient's attorney was also present. Discussed about the patient's acute life limiting condition of severe coronary artery disease. Discussed natural progression and disease trajectory of severe ischemic cardiomyopathy. Discussed about aggressive symptom management of chest discomfort/dyspnea. Discussed that the prognosis could be less than 6 months. This determines hospice eligibility. Patient is complaining of constipation will expand bowel regimen. Detailed  discussions about addition of hospice as an extra layer of support. Detailed discussions about the appropriateness of DO NOT RESUSCITATE/DO NOT INTUBATE at this time. Family wishes to discuss amongst themselves and with the patient directly this evening. Family has requested another family meeting on 06-04-15 at 1334 finalizing the above-mentioned discussions in decision-making.  Contacts/Participants in Discussion: Primary Decision Maker: Patient, then his wife, 2 daughters. Relationship to Patient wife HCPOA: yes  It is reported that one of the patient's 2 daughters is established healthcare power of attorney agent. Actual status of document unknown. Patient has wife and 2 daughters. Family makes decisions together as a unit.  SUMMARY OF RECOMMENDATIONS: CODE STATUS discussions undertaken in great detail: Full code for now. Palliative family meeting again at 1330 on 06-04-15 for further discussions Discussed about appropriate disposition options. Patient lives at home with wife. Wishes to be discharged home. Discussed about addition of hospice services at home as an extra layer of support. Discussed about hospice in great detail. All questions answered. Constipation: Will expand bowel regimen Chest discomfort, dyspnea secondary to life limiting illness of severe coronary artery disease: Agree with morphine IV. Will also have Roxanol sublingual by mouth when necessary available.  Thank you for the consult. Palliative will meet again with patient and family on 06-04-15 at 1330.  Code Status/Advance Care Planning: Full code    Code Status Orders        Start     Ordered   05/29/15 1244  Full code   Continuous     05/29/15 1244    Advance Directive Documentation        Most Recent Value   Type of Advance Directive  Living will   Pre-existing out of facility DNR order (yellow form or pink MOST form)     "MOST" Form in Place?  Other Directives:Other  Symptom Management:    Chest pain, dyspnea: Morphine IV when necessary Will also add Roxanol sublingual next line constipation: Add Senokot-S 1 tablet by mouth twice a day. Continue current bowel regimen   Palliative Prophylaxis:   Bowel Regimen  Additional Recommendations (Limitations, Scope, Preferences): Family meeting tomorrow for further discussions and to finalize CODE STATUS discussions and goals of care discussions and to finalize appropriate disposition arrangements. This may include addition of hospice services. Family wishes to discuss further amongst themselves and with the patient this evening.  Psycho-social/Spiritual:  Support System: Strong Desire for further Chaplaincy support:no Additional Recommendations: Education on Hospice  Prognosis: Less than 6 months  Discharge Planning: Home with Hospice likely    Chief Complaint/ Primary Diagnoses: Present on Admission:  . HTN (hypertension) . CKD (chronic kidney disease), stage IV ([place]) . CAD (coronary artery disease) . Non-STEMI (non-ST elevated myocardial infarction) ([place])  I have reviewed the medical record, interviewed the patient and family, and examined the patient. The following aspects are pertinent.  Past Medical History  Diagnosis Date  . HTN (hypertension) (06/17/1981)  . Diverticula of colon (06/18/1983)  . Renal insufficiency   . Hyperlipidemia 06/17/1993)  . Diabetes mellitus  (06/17/1997)  . PUD (peptic ulcer disease) 1960's  . Pancreatitis 01/24 - 07/13/08    ABD CT  fatty liver early pancreatitis  07/12/2008  Pelvic CT Nml 07/12/2008  . Cardiomyopathy     EF 25% Ant Akinesis Global Hypo 3/2-08/18/08  . Thyroid nodule     per Dr. Brantley Stage  . Renal disease     per Dr. Posey Pronto  . Hypogonadism male   . GIB (gastrointestinal bleeding)     admission 07/2014, likely diverticular bleeding  . Arthritis   . Lower GI bleed 08/08/2014  . History of MI (myocardial infarction)     January 2010  . CAD (coronary artery disease)      s/p CABG in 2010, LIMA-LAD, SVG-1st Diagonal, SVG-OM, SVG-Posterior Descending of the RCA   Social History   Social History  . Marital Status: Married    Spouse Name: N/A  . Number of Children: 2  . Years of Education: N/A   Occupational History  . Retired from [employer]    Social History Main Topics  . Smoking status: Former Smoker    Quit date: 06/17/1938  . Smokeless tobacco: Never Used  . Alcohol Use: 0.0 oz/week    0 Standard drinks or equivalent per week     Comment: Rare  . Drug Use: No  . Sexual Activity: Not Asked   Other Topics Concern  . None   Social History Narrative    The patient is retired.  He has been married since age        of 54.  They have 2 children.  They have grandchildren and great        grandchildren.     Family History  Problem Relation Age of Onset  . Angina Father   . Hypertension Father   . Heart disease Father     MI, angina  . Breast cancer Mother   . Heart disease Mother     Valve disease  . Cancer Brother     Brain tumor  . Colon cancer Neg Hx   . Prostate cancer Neg Hx   . Colon cancer Neg Hx   . Colon polyps Neg Hx   . Kidney disease Neg Hx   . Esophageal cancer  Neg Hx    Scheduled Meds: . aspirin EC  81 mg Oral Daily  . bisacodyl  10 mg Rectal Once  . carvedilol  6.25 mg Oral BID  . cholecalciferol  2,000 Units Oral Daily  . enoxaparin (LOVENOX) injection  30 mg Subcutaneous Q24H  . feeding supplement (GLUCERNA SHAKE)  237 mL Oral QPC breakfast  . insulin aspart  0-15 Units Subcutaneous TID WC  . insulin glargine  10 Units Subcutaneous Q2200  . isosorbide mononitrate  30 mg Oral Daily  . pravastatin  40 mg Oral QHS  . sodium chloride  3 mL Intravenous Q12H   Continuous Infusions:  PRN Meds:.sodium chloride, acetaminophen, acetaminophen, alum & mag hydroxide-simeth, hydrALAZINE, magnesium hydroxide, morphine injection, nitroGLYCERIN, ondansetron (ZOFRAN) IV, ondansetron  (ZOFRAN) IV, sodium chloride Medications Prior to Admission:  Prior to Admission medications   Medication Sig Start Date End Date Taking? Authorizing Provider  aspirin 325 MG tablet Take 325 mg by mouth daily.   Yes Historical Provider, MD  carvedilol (COREG) 6.25 MG tablet Take 6.25 mg by mouth 2 (two) times daily.    Yes Historical Provider, MD  Cholecalciferol (VITAMIN D) 2000 UNITS tablet Take 2,000 Units by mouth daily.   Yes Historical Provider, MD  Insulin Glargine (LANTUS) 100 UNIT/ML Solostar Pen Inject 30 Units into the skin daily at 10 pm.    Yes Historical Provider, MD  lisinopril (PRINIVIL,ZESTRIL) 2.5 MG tablet Take 2.5 mg by mouth daily.   Yes Historical Provider, MD  nitroGLYCERIN (NITROSTAT) 0.4 MG SL tablet Place 1 tablet (0.4 mg total) under the tongue every 5 (five) minutes as needed. Patient taking differently: Place 0.4 mg under the tongue every 5 (five) minutes as needed for chest pain.  08/05/14  Yes Tonia Ghent, MD  Nutritional Supplements (GLUCERNA SHAKE PO) Take 4 oz by mouth daily after breakfast.     Yes Historical Provider, MD  pravastatin (PRAVACHOL) 40 MG tablet TAKE 1 TABLET BY MOUTH AT BEDTIME 01/23/15  Yes Tonia Ghent, MD  testosterone cypionate (DEPOTESTOTERONE CYPIONATE) 200 MG/ML injection INJECT 1.25ML INTO THE MUSCLE EVERY 28 DAYS 06/30/14  Yes Tonia Ghent, MD   Allergies  Allergen Reactions  . Celecoxib     REACTION: myalgia: abdominal pain  . Kayexalate [Polystyrene] Other (See Comments)    constipation  . Nsaids     REACTION: renal disease  . Rosuvastatin     REACTION: myalgia: musle pain    Review of Systems Positive for weakness, episodic chest discomfort at home Physical Exam Elderly gentleman well-developed well-nourished resting in chair S1-S2 Lungs clear Trace edema Abdomen soft Awake alert  Vital Signs: BP 110/51 mmHg  Pulse 76  Temp(Src) 98.2 F (36.8 C) (Oral)  Resp 18  Ht 6\' 1"  (1.854 m)  Wt 96.616 kg (213 lb)   BMI 28.11 kg/m2  SpO2 97%  SpO2: SpO2: 97 % O2 Device:SpO2: 97 % O2 Flow Rate: .O2 Flow Rate (L/min): 2 L/min  IO: Intake/output summary:  Intake/Output Summary (Last 24 hours) at 06/03/15 1643 Last data filed at 06/03/15 1340  Gross per 24 hour  Intake    483 ml  Output   2155 ml  Net  -1672 ml    LBM: Last BM Date: 05/30/15 Baseline Weight: Weight: 96.7 kg (213 lb 3 oz) Most recent weight: Weight: 96.616 kg (213 lb)      Palliative Assessment/Data:  Flowsheet Rows        Most Recent Value   Intake Tab  Referral Department  Cardiology   Palliative Care Primary Diagnosis  Cardiac   Palliative Care Type  New Palliative care   Reason for referral  Counsel Regarding Hospice, Clarify Goals of Care   Clinical Assessment    Palliative Performance Scale Score  40%   Pain Max last 24 hours  5   Pain Min Last 24 hours  4   Psychosocial & Spiritual Assessment    Social Work Plan of Care  Counseling, Education on [place]    Patient/Family meeting held?  Yes   Who was at the meeting?  wife 2 dtrs   Palliative Care Outcomes  Clarified goals of care, Counseled regarding hospice   Palliative Care follow-up planned  Yes, Facility      Additional Data Reviewed:  CBC:    Component Value Date/Time   WBC 7.5 06/03/2015 0429   WBC 7.5 10/10/2014 1145   HGB 10.1* 06/03/2015 0429   HGB 13.5 10/10/2014 1145   HCT 30.9* 06/03/2015 0429   HCT 41.5 10/10/2014 1145   PLT 179 06/03/2015 0429   PLT 151 10/10/2014 1145   MCV 89.3 06/03/2015 0429   MCV 88 10/10/2014 1145   NEUTROABS 6.3 05/29/2015 0415   LYMPHSABS 1.5 05/29/2015 0415   MONOABS 1.3* 05/29/2015 0415   EOSABS 0.4 05/29/2015 0415   BASOSABS 0.0 05/29/2015 0415   Comprehensive Metabolic Panel:    Component Value Date/Time   NA 134* 06/03/2015 0429   NA 136 10/10/2014 1145   NA 139 10/11/2013   K 5.1 06/03/2015 0429   K 5.3* 10/10/2014 1145   CL 104 06/03/2015 0429   CL 108 10/10/2014  1145   CO2 21* 06/03/2015 0429   CO2 20* 10/10/2014 1145   BUN 65* 06/03/2015 0429   BUN 49* 10/10/2014 1145   BUN 37* 10/11/2013   CREATININE 3.04* 06/03/2015 0429   CREATININE 2.55* 10/14/2014 1702   CREATININE 2.63* 10/10/2014 1145   GLUCOSE 167* 06/03/2015 0429   GLUCOSE 284* 10/10/2014 1145   CALCIUM 8.2* 06/03/2015 0429   CALCIUM 8.6* 10/10/2014 1145   AST 19 05/21/2015 0307   AST 23 10/10/2014 1145   ALT 17 05/21/2015 0307   ALT 19 10/10/2014 1145   ALKPHOS 81 05/21/2015 0307   ALKPHOS 78 10/10/2014 1145   BILITOT 0.5 05/21/2015 0307   BILITOT 0.4 10/10/2014 1145   PROT 5.8* 05/21/2015 0307   PROT 6.8 10/10/2014 1145   ALBUMIN 3.0* 05/21/2015 0307   ALBUMIN 3.9 10/10/2014 1145     Time In: 1500 Time Out: 1600 Time Total: 60 Greater than 50%  of this time was spent counseling and coordinating care related to the above assessment and plan.  Signed by: Loistine Chance, MD [id number] Loistine Chance, MD  06/03/2015, 4:43 PM  Please contact Palliative Medicine Team phone at [phone] for questions and concerns.

## 2015-06-03 NOTE — Progress Notes (Signed)
SUBJECTIVE: Denies chest pain/shortness of breath. Only complaint is constipation. Family wishes to have a palliative care consult.     Intake/Output Summary (Last 24 hours) at 06/03/15 1236 Last data filed at 06/03/15 1025  Gross per 24 hour  Intake    600 ml  Output   2165 ml  Net  -1565 ml    Current Facility-Administered Medications  Medication Dose Route Frequency Provider Last Rate Last Dose  . 0.9 %  sodium chloride infusion  250 mL Intravenous PRN Lorretta Harp, MD      . acetaminophen (TYLENOL) tablet 650 mg  650 mg Oral Q4H PRN Erma Heritage, [place]      . acetaminophen (TYLENOL) tablet 650 mg  650 mg Oral Q4H PRN Lorretta Harp, MD   650 mg at 05/30/15 1125  . alum & mag hydroxide-simeth (MAALOX/MYLANTA) 200-200-20 MG/5ML suspension 30 mL  30 mL Oral Q2H PRN Thayer Headings, MD   30 mL at 06/03/15 1008  . aspirin EC tablet 81 mg  81 mg Oral Daily Erma Heritage, [place]   81 mg at 06/03/15 1008  . carvedilol (COREG) tablet 6.25 mg  6.25 mg Oral BID Erma Heritage, PA   6.25 mg at 06/03/15 1009  . cholecalciferol (VITAMIN D) tablet 2,000 Units  2,000 Units Oral Daily Erma Heritage, [place]   2,000 Units at 06/03/15 1008  . enoxaparin (LOVENOX) injection 30 mg  30 mg Subcutaneous Q24H Thayer Headings, MD   30 mg at 06/02/15 2118  . feeding supplement (GLUCERNA SHAKE) (GLUCERNA SHAKE) liquid 237 mL  237 mL Oral QPC breakfast Erma Heritage, [place]   237 mL at 05/30/15 0900  . hydrALAZINE (APRESOLINE) injection 10 mg  10 mg Intravenous Q4H PRN Lorretta Harp, MD      . insulin aspart (novoLOG) injection 0-15 Units  0-15 Units Subcutaneous TID [facility] Erma Heritage, [place]   3 Units at 06/03/15 [phone]  . insulin glargine (LANTUS) injection 10 Units  10 Units Subcutaneous Q2200 Fransisco Hertz [place], [place]   10 Units at 06/02/15 2118  . isosorbide mononitrate (IMDUR) 24 hr tablet 30 mg  30 mg Oral Daily Thayer Headings, MD   30 mg at 06/03/15 1008  . magnesium hydroxide  (MILK OF MAGNESIA) suspension 30 mL  30 mL Oral Daily PRN Thayer Headings, MD      . morphine 4 MG/ML injection 4 mg  4 mg Intravenous Q1H PRN Thayer Headings, MD   4 mg at 06/02/15 0950  . nitroGLYCERIN (NITROSTAT) SL tablet 0.4 mg  0.4 mg Sublingual Q5 min PRN Erma Heritage, PA   0.4 mg at 06/01/15 2342  . ondansetron (ZOFRAN) injection 4 mg  4 mg Intravenous Q6H PRN Erma Heritage, PA      . ondansetron [facility]) injection 4 mg  4 mg Intravenous Q6H PRN Lorretta Harp, MD   4 mg at 06/02/15 0950  . pravastatin (PRAVACHOL) tablet 40 mg  40 mg Oral QHS Erma Heritage, [place]   40 mg at 06/02/15 2118  . sodium chloride 0.9 % injection 3 mL  3 mL Intravenous Q12H Lorretta Harp, MD   3 mL at 06/02/15 2119  . sodium chloride 0.9 % injection 3 mL  3 mL Intravenous PRN Lorretta Harp, MD        Filed Vitals:   06/02/15 [id number] 06/02/15 1449 06/02/15 2036 06/03/15 0407  BP:  129/59 105/55 118/50 110/51  Pulse: 73 67 53 76  Temp:  97.4 F (36.3 C) 98 F (36.7 C) 98.2 F (36.8 C)  TempSrc:  Oral Oral Oral  Resp:  18 18 18   Height:      Weight:    213 lb (96.616 kg)  SpO2: 96% 96% 96% 97%    PHYSICAL EXAM General: NAD HEENT: Normal. Neck: No JVD, no thyromegaly.  Lungs: Diminished at bases, no rales/wheezes. CV: Regular rate and rhythm, normal S1/S2, no S3/S4, no murmur.  Trace pedal edema.  Abdomen: Soft, nontender, no distention.  Neurologic: Alert and oriented.  Psych: Normal affect. Musculoskeletal: No gross deformities.    LABS: Basic Metabolic Panel:  Recent Labs  06/01/15 0308 06/03/15 0429  NA 135 134*  K 5.2* 5.1  CL 106 104  CO2 20* 21*  GLUCOSE 142* 167*  BUN 59* 65*  CREATININE 3.28* 3.04*  CALCIUM 8.2* 8.2*   Liver Function Tests: No results for input(s): AST, ALT, ALKPHOS, BILITOT, PROT, ALBUMIN in the last 72 hours. No results for input(s): LIPASE, AMYLASE in the last 72 hours. CBC:  Recent Labs  06/02/15 0310 06/03/15 0429  WBC 10.5 7.5    HGB 10.6* 10.1*  HCT 31.9* 30.9*  MCV 89.1 89.3  PLT 166 179   Cardiac Enzymes: No results for input(s): CKTOTAL, CKMB, CKMBINDEX, TROPONINI in the last 72 hours. BNP: Invalid input(s): POCBNP D-Dimer: No results for input(s): DDIMER in the last 72 hours. Hemoglobin A1C: No results for input(s): HGBA1C in the last 72 hours. Fasting Lipid Panel: No results for input(s): CHOL, HDL, LDLCALC, TRIG, CHOLHDL, LDLDIRECT in the last 72 hours. Thyroid Function Tests: No results for input(s): TSH, T4TOTAL, T3FREE, THYROIDAB in the last 72 hours.  Invalid input(s): FREET3 Anemia Panel: No results for input(s): VITAMINB12, FOLATE, FERRITIN, TIBC, IRON, RETICCTPCT in the last 72 hours.  RADIOLOGY: Dg Chest 2 View  05/29/2015  CLINICAL DATA:  RIGHT-sided chest pain, admitted for pneumonia last week. EXAM: CHEST  2 VIEW COMPARISON:  Chest radiograph May 22, 2015 FINDINGS: Cardiac silhouette is upper limits of normal in size, mediastinal silhouette is nonsuspicious. Increased lung volumes with flattened hemidiaphragms compatible with COPD. LEFT mid and lower lung zone strandy densities without pleural effusion or focal consolidation. No pneumothorax. Dual lead LEFT cardiac defibrillator in situ. Status post median sternotomy. Soft tissue planes and included osseous structures are nonsuspicious. Surgical clips in the abdomen. IMPRESSION: LEFT mid lung zone atelectasis/ scarring. Electronically Signed   By: Elon Alas M.D.   On: 05/29/2015 05:20   Dg Chest 2 View  05/22/2015  CLINICAL DATA:  Left side pneumonia EXAM: CHEST  2 VIEW COMPARISON:  05/21/2015 FINDINGS: Cardiomediastinal silhouette is stable. There is improvement in aeration left base. Minimal residual atelectasis or infiltrate in lingula. No pulmonary edema. Three leads cardiac pacemaker is unchanged in position. Status post CABG. IMPRESSION: There is improvement in aeration left base. Minimal residual atelectasis or infiltrate in  lingula. No pulmonary edema. Three leads cardiac pacemaker is unchanged in position. Electronically Signed   By: Lahoma Crocker M.D.   On: 05/22/2015 11:01   Dg Chest 2 View  05/21/2015  CLINICAL DATA:  Awakened at 1:15 feeling weak and dyspnea. Sharp substernal pain. EXAM: CHEST  2 VIEW COMPARISON:  03/07/2014 CT.  02/10/2012 radiographs. FINDINGS: Opacity in the lateral left base may represent early infiltrate. The right lung is clear. Pulmonary vasculature is normal. There is unchanged moderate cardiomegaly. There are intact appearances of the  transvenous cardiac leads. IMPRESSION: Lateral left base opacity, possibly early infiltrate or atelectasis. Electronically Signed   By: Andreas Newport M.D.   On: 05/21/2015 04:47   Dg Abd 2 Views  05/21/2015  CLINICAL DATA:  Awakened at 01:15, feeling weakness and dyspnea. Sharp substernal pain. EXAM: ABDOMEN - 2 VIEW COMPARISON:  None. FINDINGS: The bowel gas pattern is normal. There is no evidence of free air. No radio-opaque calculi or other significant radiographic abnormality is seen. IMPRESSION: Negative. Electronically Signed   By: Andreas Newport M.D.   On: 05/21/2015 04:48      ASSESSMENT AND PLAN: 1. NSTEMI (non-ST elevated myocardial infarction) ([place]) - history of CAD s/p CABG in 2010, LIMA-LAD, SVG-1st Diagonal, SVG-OM, SVG-Posterior Descending of the RCA Cathshowed patent IMA and SVG to the RCA. The SVG to 1st diag and SVG to OM are occluded  Has ruled in for MI . There were no clear culprit vessels to open. Has severe native CAD. Likely had occlusion of a small branch  Angina under control with morphine and Imdur 30 a day .  Since CAD not amenable to percutaneous intervention, will obtain palliative care consult. Family desiring this.   2. Ischemic cardiomyopathy - EF 20% by recent echo s/p ICD placement 2011 holding ACE - I   3. Chronic combined systolic and diastolic CHF - EF of [id number], Diffuse hypokinesis, and Grade 1 DD on  recent echo in 05/2015. - does not appear volume overloaded on exam. -continue Coreg  4. Stage 4 CKD  - s/p left partial nephrectomy in 2010 due to [place] - baseline 2.5 - 2.7. - 3.3 Creatinine is already coming back down to 3.04 today   5. Essential HTN Well controlled. We have held the ACE-I due to his slight worsening renal function I anticipate restarting it once his creatinine is back to baseline of ~ 2.5   6. HLD - continue statin  7. Type 2 DM  - continue Lantus 30U nightly  8. Constipation - prune juice and laxatives .    Kate Sable, M.D., F.A.C.C.

## 2015-06-04 DIAGNOSIS — R079 Chest pain, unspecified: Secondary | ICD-10-CM

## 2015-06-04 DIAGNOSIS — R0600 Dyspnea, unspecified: Secondary | ICD-10-CM

## 2015-06-04 DIAGNOSIS — Z7189 Other specified counseling: Secondary | ICD-10-CM | POA: Insufficient documentation

## 2015-06-04 LAB — BASIC METABOLIC PANEL
ANION GAP: 9 (ref 5–15)
BUN: 61 mg/dL — AB (ref 6–20)
CHLORIDE: 106 mmol/L (ref 101–111)
CO2: 20 mmol/L — ABNORMAL LOW (ref 22–32)
Calcium: 8.3 mg/dL — ABNORMAL LOW (ref 8.9–10.3)
Creatinine, Ser: 2.86 mg/dL — ABNORMAL HIGH (ref 0.61–1.24)
GFR calc Af Amer: 21 mL/min — ABNORMAL LOW (ref >60)
GFR calc non Af Amer: 18 mL/min — ABNORMAL LOW (ref >60)
Glucose, Bld: 183 mg/dL — ABNORMAL HIGH (ref 65–99)
POTASSIUM: 5 mmol/L (ref 3.5–5.1)
SODIUM: 135 mmol/L (ref 135–145)

## 2015-06-04 LAB — GLUCOSE, CAPILLARY
GLUCOSE-CAPILLARY: 154 mg/dL — AB (ref 65–99)
GLUCOSE-CAPILLARY: 132 mg/dL — AB (ref 65–99)
GLUCOSE-CAPILLARY: 212 mg/dL — AB (ref 65–99)
Glucose-Capillary: 140 mg/dL — ABNORMAL HIGH (ref 65–99)

## 2015-06-04 LAB — CBC
HCT: 31 % — ABNORMAL LOW (ref 39.0–52.0)
HEMOGLOBIN: 10.3 g/dL — AB (ref 13.0–17.0)
MCH: 29.4 pg (ref 26.0–34.0)
MCHC: 33.2 g/dL (ref 30.0–36.0)
MCV: 88.6 fL (ref 78.0–100.0)
Platelets: 173 10*3/uL (ref 150–400)
RBC: 3.5 MIL/uL — AB (ref 4.22–5.81)
RDW: 14.5 % (ref 11.5–15.5)
WBC: 6.3 10*3/uL (ref 4.0–10.5)

## 2015-06-04 NOTE — Progress Notes (Signed)
Pt did a 14 beat run of vtach. Pt asymptomatic. BP139/71, HR 76, Oxygen 99 on room air. MD notified. Will continue to monitor.

## 2015-06-04 NOTE — Progress Notes (Signed)
Daily Progress Note   Patient Name: [name]       Date: 06/04/2015 DOB: 1925/11/14  Age: 81 y.o. MRN#: [id number] Attending Physician: Thayer Headings, MD Primary Care Physician: Elsie Stain, MD Admit Date: 05/29/2015  Reason for Consultation/Follow-up: Establishing goals of care  Life limiting illness: Severe coronary artery disease, ischemic cardiomyopathy status post ICD placement Subjective:  Sitting in chair resting complaints of underlying baseline chest discomfort. Denies any dyspnea.   Interval Events/family meeting: Met with the patient, his wife, 2 daughters and granddaughter at the bedside. Discussed about the context of current hospitalization. Patient with severe coronary artery disease. Patient underwent a catheterization not a member to percutaneous intervention. Not a candidate for CABG. Patient has ischemic cardiomyopathy and underlying kidney disease as well. Goals of care discussions were undertaken separately with the family outside the patient's room in initial palliative consultation on 12-70-16.  Patient is sitting up in a chair resting. He denies any acute discomfort. He appears comfortable. He states he has had a bowel movement. His bowel regimen was expanded yesterday. He is awake alert able to engage in full conversations. Discussed about the addition of hospice services at home as an extra layer of support. Patient is agreeable. He asked how that would be different from the home CHF program that the patient's wife has. Discussed in detail about home health care/home CHF program versus home with hospice support.  CODE STATUS discussions also undertaken in great detail. Patient elects DO NOT INTUBATE. Patient already has an ICD. Discussed that based on the  patient's disease trajectory, hospice will continue discussions about turning off defibrillator to focus exclusively on comfort measures. Patient is agreeable.  Case management consultation will be placed. Patient's daughter Daine Floras request to be called first with regards to arranging hospice at home. Patient okay for discharge when hospice arrangements completed and been deemed medically stable by cardiology. Thank you for the consult. Continue Roxanol when necessary for symptom management of chest discomfort/dyspnea. Continue bowel and anti-emetic regimen with by mouth opioids.   Length of Stay: 6 days  Current Medications: Scheduled Meds:  . aspirin EC  81 mg Oral Daily  . carvedilol  6.25 mg Oral BID  . cholecalciferol  2,000 Units Oral Daily  . enoxaparin (LOVENOX) injection  30 mg Subcutaneous Q24H  .  feeding supplement (GLUCERNA SHAKE)  237 mL Oral QPC breakfast  . insulin aspart  0-15 Units Subcutaneous TID WC  . insulin glargine  10 Units Subcutaneous Q2200  . isosorbide mononitrate  30 mg Oral Daily  . pravastatin  40 mg Oral QHS  . senna-docusate  1 tablet Oral BID  . sodium chloride  3 mL Intravenous Q12H    Continuous Infusions:    PRN Meds: sodium chloride, acetaminophen, acetaminophen, alum & mag hydroxide-simeth, hydrALAZINE, magnesium hydroxide, morphine injection, morphine CONCENTRATE, nitroGLYCERIN, ondansetron (ZOFRAN) IV, ondansetron (ZOFRAN) IV, sodium chloride  Physical Exam: Physical Exam             NAD sitting inc hair S1 S2 Lungs clear Trace edema Non focal Abdomen soft   Vital Signs: BP 134/78 mmHg  Pulse 79  Temp(Src) 98 F (36.7 C) (Oral)  Resp 18  Ht _0  (1.854 m)  Wt 95.165 kg (209 lb 12.8 oz)  BMI 27.69 kg/m2  SpO2 97% SpO2: SpO2: 97 % O2 Device: O2 Device: Not Delivered O2 Flow Rate: O2 Flow Rate (L/min): 2 L/min  Intake/output summary:  Intake/Output Summary (Last 24 hours) at 06/04/15 1402 Last data filed at 06/04/15 0900  Gross  per 24 hour  Intake      0 ml  Output   1200 ml  Net  -1200 ml   LBM: Last BM Date: 06/03/15 Baseline Weight: Weight: 96.7 kg (213 lb 3 oz) Most recent weight: Weight: 95.165 kg (209 lb 12.8 oz)       Palliative Assessment/Data: Flowsheet Rows        Most Recent Value   Intake Tab    Referral Department  Cardiology   Unit at Time of Referral  Cardiac/Telemetry Unit   Palliative Care Primary Diagnosis  Cardiac   Palliative Care Type  Return patient Palliative Care   Reason for referral  Counsel Regarding Hospice, Clarify Goals of Care   Clinical Assessment    Palliative Performance Scale Score  40%   Pain Max last 24 hours  6   Pain Min Last 24 hours  4   Psychosocial & Spiritual Assessment    Social Work Plan of Care  Counseling, Education on Hospice   Palliative Care Outcomes    Patient/Family meeting held?  Yes   Who was at the meeting?  wife 2 dtrs   Palliative Care Outcomes  Clarified goals of care, Counseled regarding hospice   Palliative Care follow-up planned  Yes, Facility      Additional Data Reviewed: CBC    Component Value Date/Time   WBC 6.3 06/04/2015 0215   WBC 7.5 10/10/2014 1145   RBC 3.50* 06/04/2015 0215   RBC 3.98* 05/21/2015 0617   RBC 4.74 10/10/2014 1145   HGB 10.3* 06/04/2015 0215   HGB 13.5 10/10/2014 1145   HCT 31.0* 06/04/2015 0215   HCT 41.5 10/10/2014 1145   PLT 173 06/04/2015 0215   PLT 151 10/10/2014 1145   MCV 88.6 06/04/2015 0215   MCV 88 10/10/2014 1145   MCH 29.4 06/04/2015 0215   MCH 28.5 10/10/2014 1145   MCHC 33.2 06/04/2015 0215   MCHC 32.5 10/10/2014 1145   RDW 14.5 06/04/2015 0215   RDW 14.5 10/10/2014 1145   LYMPHSABS 1.5 05/29/2015 0415   MONOABS 1.3* 05/29/2015 0415   EOSABS 0.4 05/29/2015 0415   BASOSABS 0.0 05/29/2015 0415    CMP     Component Value Date/Time   NA 135 06/04/2015 0215   NA 136  10/10/2014 1145   NA 139 10/11/2013   K 5.0 06/04/2015 0215   K 5.3* 10/10/2014 1145   CL 106 06/04/2015 0215    CL 108 10/10/2014 1145   CO2 20* 06/04/2015 0215   CO2 20* 10/10/2014 1145   GLUCOSE 183* 06/04/2015 0215   GLUCOSE 284* 10/10/2014 1145   BUN 61* 06/04/2015 0215   BUN 49* 10/10/2014 1145   BUN 37* 10/11/2013   CREATININE 2.86* 06/04/2015 0215   CREATININE 2.55* 10/14/2014 1702   CREATININE 2.63* 10/10/2014 1145   CALCIUM 8.3* 06/04/2015 0215   CALCIUM 8.6* 10/10/2014 1145   PROT 5.8* 05/21/2015 0307   PROT 6.8 10/10/2014 1145   ALBUMIN 3.0* 05/21/2015 0307   ALBUMIN 3.9 10/10/2014 1145   AST 19 05/21/2015 0307   AST 23 10/10/2014 1145   ALT 17 05/21/2015 0307   ALT 19 10/10/2014 1145   ALKPHOS 81 05/21/2015 0307   ALKPHOS 78 10/10/2014 1145   BILITOT 0.5 05/21/2015 0307   BILITOT 0.4 10/10/2014 1145   GFRNONAA 18* 06/04/2015 0215   GFRNONAA 21* 10/10/2014 1145   GFRAA 21* 06/04/2015 0215   GFRAA 24* 10/10/2014 1145       Problem List:  Patient Active Problem List   Diagnosis Date Noted  . Chest pain   . Dyspnea   . Encounter for palliative care   . NSTEMI (non-ST elevated myocardial infarction) ([place]) 05/29/2015  . Ischemic cardiomyopathy 05/29/2015  . Non-STEMI (non-ST elevated myocardial infarction) ([place]) 05/29/2015  . CAP (community acquired pneumonia) 05/21/2015  . Renal insufficiency 05/21/2015  . Anemia 05/21/2015  . Weakness 05/21/2015  . Acute constipation 01/26/2015  . Diabetic neuropathy ([place]) 01/06/2015  . Faintness 12/07/2014  . Cough 11/09/2014  . Syncope 10/14/2014  . Weakness generalized 08/24/2014  . Chronic renal failure   . Diverticulosis of large intestine with hemorrhage   . Hyperglycemia   . Medicare annual wellness visit, initial 11/24/2013  . ICD (implantable cardioverter-defibrillator) in place 08/16/2013  . ED (erectile dysfunction) 08/26/2012  . CAD (coronary artery disease) 03/12/2011  . CKD (chronic kidney disease), stage IV ([place]) 10/05/2010  . Thyroid nodule 09/25/2010  . Gout 03/14/2010  . VITAMIN D DEFICIENCY 10/09/2009    . Malignant neoplasm of kidney-s/p partial resection; complex cyst remaining tissue 01/09/2009  . Chronic systolic congestive heart failure, NYHA class 3 ([place]) 12/07/2008  . GERD 08/02/2008  . IRRITABLE BOWEL SYNDROME 11/27/2007  . DEGENERATIVE JOINT DISEASE 11/27/2007  . Obese 09/07/2007  . Testosterone deficiency 07/08/2001  . PEPTIC ULCER DISEASE, HELICOBACTER PYLORI POSITIVE 12/15/2000  . DM (diabetes mellitus), type 2, uncontrolled, with renal complications ([place]) 01/75/1025  . Dyslipidemia 06/17/1993  . HTN (hypertension) 06/17/1981  . PUD, HX OF 06/18/1963     Palliative Care Assessment & Plan    1.Code Status:  Limited code    Code Status Orders        Start     Ordered   06/04/15 1400  Limited resuscitation (code)   Continuous    Question Answer Comment  In the event of cardiac or respiratory ARREST: Initiate Code Blue, Call Rapid Response Yes   In the event of cardiac or respiratory ARREST: Perform CPR No   In the event of cardiac or respiratory ARREST: Perform Intubation/Mechanical Ventilation No   In the event of cardiac or respiratory ARREST: Use NIPPV/BiPAp only if indicated Yes   In the event of cardiac or respiratory ARREST: Administer ACLS medications if indicated Yes   In the event of  cardiac or respiratory ARREST: Perform Defibrillation or Cardioversion if indicated Yes   Comments patient has ICD      06/04/15 1400    Advance Directive Documentation        Most Recent Value   Type of Advance Directive  Living will   Pre-existing out of facility DNR order (yellow form or pink MOST form)     "MOST" Form in Place?         2. Goals of Care/Additional Recommendations:   Home with hospice, symptom management with Roxanol and bowel regimen.  Limitations on Scope of Treatment:  home with hospice  Desire for further Chaplaincy support:no  Psycho-social Needs: Education on Hospice  3. Symptom Management:      1.  Constipation: Senokot-S 2. Chest  pain/dyspnea Roxanol when necessary  4. Palliative Prophylaxis:   Delirium Protocol  5. Prognosis: < 6 months  6. Discharge Planning:  Home with Hospice   Care plan was discussed with    Thank you for allowing the Palliative Medicine Team to assist in the care of this patient.   Time In: 1325 Time Out: 1400 Total Time 35 Prolonged Time Billed  no        [phone] Loistine Chance, MD  06/04/2015, 2:02 PM  Please contact Palliative Medicine Team phone at [phone] for questions and concerns.

## 2015-06-04 NOTE — Care Management Important Message (Signed)
Important Message  Patient Details  Name: [name] MRN: [id number] Date of Birth: 1926-02-13   Medicare Important Message Given:  Yes    Apolonio Schneiders, RN 06/04/2015, 12:59 PM

## 2015-06-04 NOTE — Progress Notes (Addendum)
SUBJECTIVE: Denies chest pain/shortness of breath. Only complaint is constipation. Family wishes to have a palliative care consult.  Intake/Output Summary (Last 24 hours) at 06/04/15 1245 Last data filed at 06/04/15 0900  Gross per 24 hour  Intake      0 ml  Output   1540 ml  Net  -1540 ml   INPATIENT MEDS:   . aspirin EC  81 mg Oral Daily  . carvedilol  6.25 mg Oral BID  . cholecalciferol  2,000 Units Oral Daily  . enoxaparin (LOVENOX) injection  30 mg Subcutaneous Q24H  . feeding supplement (GLUCERNA SHAKE)  237 mL Oral QPC breakfast  . insulin aspart  0-15 Units Subcutaneous TID WC  . insulin glargine  10 Units Subcutaneous Q2200  . isosorbide mononitrate  30 mg Oral Daily  . pravastatin  40 mg Oral QHS  . senna-docusate  1 tablet Oral BID  . sodium chloride  3 mL Intravenous Q12H   Filed Vitals:   06/02/15 2036 06/03/15 0407 06/03/15 1931 06/04/15 0329  BP: 118/50 110/51 140/77 134/78  Pulse: 53 76 86 79  Temp: 98 F (36.7 C) 98.2 F (36.8 C) 98.1 F (36.7 C) 98 F (36.7 C)  TempSrc: Oral Oral Oral Oral  Resp: 18 18 16 18   Height:      Weight:  213 lb (96.616 kg)  209 lb 12.8 oz (95.165 kg)  SpO2: 96% 97% 96% 97%   PHYSICAL EXAM General: NAD HEENT: Normal. Neck: No JVD, no thyromegaly.  Lungs: Diminished at bases, no rales/wheezes. CV: Regular rate and rhythm, normal S1/S2, no S3/S4, no murmur.  Trace pedal edema.  Abdomen: Soft, nontender, no distention.  Neurologic: Alert and oriented.  Psych: Normal affect. Musculoskeletal: No gross deformities.   LABS: Basic Metabolic Panel:  Recent Labs  06/03/15 0429 06/04/15 0215  NA 134* 135  K 5.1 5.0  CL 104 106  CO2 21* 20*  GLUCOSE 167* 183*  BUN 65* 61*  CREATININE 3.04* 2.86*  CALCIUM 8.2* 8.3*    Recent Labs  06/03/15 0429 06/04/15 0215  WBC 7.5 6.3  HGB 10.1* 10.3*  HCT 30.9* 31.0*  MCV 89.3 88.6  PLT 179 173   RADIOLOGY: Dg Chest 2 View  05/29/2015  CLINICAL DATA:  RIGHT-sided  chest pain, admitted for pneumonia last week. EXAM: CHEST  2 VIEW COMPARISON:  Chest radiograph May 22, 2015 FINDINGS: Cardiac silhouette is upper limits of normal in size, mediastinal silhouette is nonsuspicious. Increased lung volumes with flattened hemidiaphragms compatible with COPD. LEFT mid and lower lung zone strandy densities without pleural effusion or focal consolidation. No pneumothorax. Dual lead LEFT cardiac defibrillator in situ. Status post median sternotomy. Soft tissue planes and included osseous structures are nonsuspicious. Surgical clips in the abdomen. IMPRESSION: LEFT mid lung zone atelectasis/ scarring. Electronically Signed       ASSESSMENT AND PLAN:  1. NSTEMI (non-ST elevated myocardial infarction) ([place]) - history of CAD s/p CABG in 2010, LIMA-LAD, SVG-1st Diagonal, SVG-OM, SVG-Posterior Descending of the RCA Cathshowed patent IMA and SVG to the RCA. The SVG to 1st diag and SVG to OM are occluded  Has ruled in for MI . There were no clear culprit vessels to open. Has severe native CAD. Likely had occlusion of a small branch  Angina under control with morphine and Imdur 30 a day .  Since CAD not amenable to percutaneous intervention, will obtain palliative care consult. Family desiring this.  2. Ischemic cardiomyopathy - EF 20% by recent echo  s/p ICD placement 2011 holding ACE - I   3. Chronic combined systolic and diastolic CHF - EF of 123456, Diffuse hypokinesis, and Grade 1 DD on recent echo in 05/2015. - does not appear volume overloaded on exam, diuresed 1.5 L overnight on no diuretics, clear lungs, less distended abdomen, crea is improving 3.0 --> 2.86. -continue Coreg  4. Stage 4 CKD  - s/p left partial nephrectomy in 2010 due to [employer] - baseline 2.5 - 2.7. - 3.3 Creatinine is already coming back down to 3.04 today   5. Essential HTN Well controlled. We have held the ACE-I due to his slight worsening renal function I anticipate restarting it  once his creatinine is back to baseline of ~ 2.5   6. HLD - continue statin  7. Type 2 DM  - continue Lantus 30U nightly  8. Constipation - prune juice and laxatives .    The family talked to palliative care yesterday, home hospice was suggested, the family will have another meeting today that will involve the patient himself. They will decide afterwards.   Dorothy Spark 06/04/2015

## 2015-06-05 ENCOUNTER — Encounter (HOSPITAL_COMMUNITY): Payer: Self-pay | Admitting: Student

## 2015-06-05 ENCOUNTER — Telehealth: Payer: Self-pay | Admitting: Family Medicine

## 2015-06-05 DIAGNOSIS — Z515 Encounter for palliative care: Secondary | ICD-10-CM

## 2015-06-05 LAB — CBC
HCT: 32.8 % — ABNORMAL LOW (ref 39.0–52.0)
Hemoglobin: 10.4 g/dL — ABNORMAL LOW (ref 13.0–17.0)
MCH: 28.2 pg (ref 26.0–34.0)
MCHC: 31.7 g/dL (ref 30.0–36.0)
MCV: 88.9 fL (ref 78.0–100.0)
PLATELETS: 195 10*3/uL (ref 150–400)
RBC: 3.69 MIL/uL — ABNORMAL LOW (ref 4.22–5.81)
RDW: 14.3 % (ref 11.5–15.5)
WBC: 6 10*3/uL (ref 4.0–10.5)

## 2015-06-05 LAB — BASIC METABOLIC PANEL
ANION GAP: 11 (ref 5–15)
BUN: 59 mg/dL — ABNORMAL HIGH (ref 6–20)
CALCIUM: 8.4 mg/dL — AB (ref 8.9–10.3)
CO2: 18 mmol/L — ABNORMAL LOW (ref 22–32)
CREATININE: 2.71 mg/dL — AB (ref 0.61–1.24)
Chloride: 107 mmol/L (ref 101–111)
GFR, EST AFRICAN AMERICAN: 23 mL/min — AB (ref >60)
GFR, EST NON AFRICAN AMERICAN: 20 mL/min — AB (ref >60)
Glucose, Bld: 148 mg/dL — ABNORMAL HIGH (ref 65–99)
Potassium: 4.9 mmol/L (ref 3.5–5.1)
SODIUM: 136 mmol/L (ref 135–145)

## 2015-06-05 LAB — GLUCOSE, CAPILLARY
Glucose-Capillary: 128 mg/dL — ABNORMAL HIGH (ref 65–99)
Glucose-Capillary: 246 mg/dL — ABNORMAL HIGH (ref 65–99)

## 2015-06-05 MED ORDER — MORPHINE SULFATE (PF) 4 MG/ML IV SOLN: 4.0000 mg | INTRAVENOUS | Status: AC | PRN

## 2015-06-05 MED ORDER — ISOSORBIDE MONONITRATE ER 30 MG PO TB24: 30.0000 mg | ORAL_TABLET | Freq: Every day | ORAL | Status: AC

## 2015-06-05 MED ORDER — ALUM & MAG HYDROXIDE-SIMETH 200-200-20 MG/5ML PO SUSP: 30.0000 mL | ORAL | Status: AC | PRN

## 2015-06-05 MED ORDER — NITROGLYCERIN 0.4 MG SL SUBL: 0.4000 mg | SUBLINGUAL_TABLET | SUBLINGUAL | Status: AC | PRN

## 2015-06-05 MED ORDER — ASPIRIN 81 MG PO TBEC: 81.0000 mg | DELAYED_RELEASE_TABLET | Freq: Every day | ORAL | Status: AC

## 2015-06-05 MED ORDER — SENNOSIDES-DOCUSATE SODIUM 8.6-50 MG PO TABS: 1.0000 | ORAL_TABLET | Freq: Two times a day (BID) | ORAL | Status: AC

## 2015-06-05 MED ORDER — MORPHINE SULFATE (CONCENTRATE) 10 MG/0.5ML PO SOLN: 5.0000 mg | ORAL | Status: AC | PRN

## 2015-06-05 MED ORDER — INSULIN GLARGINE 100 UNIT/ML SOLOSTAR PEN: 10.0000 [IU] | PEN_INJECTOR | Freq: Every day | SUBCUTANEOUS | Status: AC

## 2015-06-05 NOTE — Telephone Encounter (Signed)
Yes, I want hospice help.  I had been following inpatient reports.  Is he going home with hospice or to a hospice facility?  Thanks.

## 2015-06-05 NOTE — Care Management Note (Addendum)
Case Management Note Previous CM note initiated by Elissa Hefty RN CM   Patient Details  Name: [name] MRN: [id number] Date of Birth: [birth year]  Subjective/Objective:     Adm w nstemi               Action/Plan: lives w fam, pcp dr g Damita Dunnings   Expected Discharge Date:    06/05/15              Expected Discharge Plan:  Home w Hospice Care  In-House Referral:     Discharge planning Services  CM Consult  Post Acute Care Choice:  Hospice Choice offered to:  Adult Children  DME Arranged:    DME Agency:     HH Arranged:  Disease Management [place] Agency:  [facility]  Status of Service:  Completed, signed off  Medicare Important Message Given:  Yes Date Medicare IM Given:    Medicare IM give by:    Date Additional Medicare IM Given:    Additional Medicare Important Message give by:     If discussed at [place] of Stay Meetings, dates discussed:    Additional Comments: ur review done  06/05/15- referral received for home Hospice choice- call made to pt's daughter Janne Lab [phone]- discussed Home Hospice options- offered choice of agencies in [facility]- per daughter's choice would like to use [facility]- referral called in-spoke with Evette- plan to d/c pt later today home with Hospice- per discussion with Suzi- there are no DME needs other than maybe a W/C at this time- pt is on RA. Also discussed transportation needs- at this time family to take pt home in private vehicle - but will see how pt works with PT today- and if needed agreeable to ambulance transportation for safety.   Update- per Stacie with [facility]- everything has been arranged with Hospice- they are scheduled to see pt tomorrow for admission. Plan remains for family to transport home.   Dawayne Patricia, RN 06/05/2015, 10:24 AM

## 2015-06-05 NOTE — Progress Notes (Signed)
PT Cancellation Note  Patient Details Name: [name] MRN: [id number] DOB: 01-15-1926   Cancelled Treatment:    Reason Eval/Treat Not Completed: Other (comment) patient reports going home later today. Spoke with patient and wife about feasibility of going home by car.  They feel comfortable that with their daughter's help can get home okay in private vehicle.  Encouraged to place a chair near entry door for allowing pt to sit to rest if needed after stepping in house (one step to enter at garage entrance).  No charge as pt prepping for d/c home today with family assist.    Reginia Naas 06/05/2015, 1:13 PM  Magda Kiel, [place] 06/05/2015

## 2015-06-05 NOTE — Telephone Encounter (Signed)
[facility] called  they want to know if dr Damita Dunnings will be the attending Pt is being released from [facility] into hospice care today   cb number for hospice is [phone] Thank you

## 2015-06-05 NOTE — Telephone Encounter (Signed)
Hospice advised.

## 2015-06-05 NOTE — Telephone Encounter (Signed)
Patient will be going home.

## 2015-06-05 NOTE — Consult Note (Signed)
   [facility] CM Inpatient Consult   06/05/2015  [name] 06/14/1926 [id number] Patient was previously being followed by [place] [facility] Management prior to admission. I understand that he/family has chosen to pursue hospice for services at home.   Given this choice [name] will have full case management services through Hospice and we will close the case for community case management with [facility].  THN will sign off at this time. For any questions, please contact: Natividad Brood, RN BSN [facility] Liaison  [phone] business mobile phone

## 2015-06-05 NOTE — Progress Notes (Signed)
SUBJECTIVE: Denies chest pain/shortness of breath. Just feels weak. Anxious to go home.    Intake/Output Summary (Last 24 hours) at 06/05/15 1010 Last data filed at 06/05/15 0349  Gross per 24 hour  Intake    240 ml  Output    875 ml  Net   -635 ml   INPATIENT MEDS:   . aspirin EC  81 mg Oral Daily  . carvedilol  6.25 mg Oral BID  . cholecalciferol  2,000 Units Oral Daily  . enoxaparin (LOVENOX) injection  30 mg Subcutaneous Q24H  . feeding supplement (GLUCERNA SHAKE)  237 mL Oral QPC breakfast  . insulin aspart  0-15 Units Subcutaneous TID WC  . insulin glargine  10 Units Subcutaneous Q2200  . isosorbide mononitrate  30 mg Oral Daily  . pravastatin  40 mg Oral QHS  . senna-docusate  1 tablet Oral BID  . sodium chloride  3 mL Intravenous Q12H   Filed Vitals:   06/04/15 1544 06/04/15 1803 06/04/15 2024 06/05/15 0348  BP: 142/74 139/71 132/83 145/79  Pulse: 83 76 80 78  Temp: 97.5 F (36.4 C)  98.2 F (36.8 C) 97.5 F (36.4 C)  TempSrc: Oral  Oral Oral  Resp: 19  18 18   Height:      Weight:    94.575 kg (208 lb 8 oz)  SpO2: 99% 99% 97% 97%   PHYSICAL EXAM General: NAD HEENT: Normal. Neck: No JVD, no thyromegaly.  Lungs: Diminished at bases, no rales/wheezes. CV: Regular rate and rhythm, normal S1/S2, no S3/S4, no murmur.  Trace pedal edema.  Abdomen: Soft, nontender, no distention.  Neurologic: Alert and oriented.  Psych: Normal affect. Musculoskeletal: No gross deformities.   LABS: Basic Metabolic Panel:  Recent Labs  06/04/15 0215 06/05/15 0528  NA 135 136  K 5.0 4.9  CL 106 107  CO2 20* 18*  GLUCOSE 183* 148*  BUN 61* 59*  CREATININE 2.86* 2.71*  CALCIUM 8.3* 8.4*    Recent Labs  06/04/15 0215 06/05/15 0528  WBC 6.3 6.0  HGB 10.3* 10.4*  HCT 31.0* 32.8*  MCV 88.6 88.9  PLT 173 195   RADIOLOGY: Dg Chest 2 View  05/29/2015  CLINICAL DATA:  RIGHT-sided chest pain, admitted for pneumonia last week. EXAM: CHEST  2 VIEW COMPARISON:  Chest  radiograph May 22, 2015 FINDINGS: Cardiac silhouette is upper limits of normal in size, mediastinal silhouette is nonsuspicious. Increased lung volumes with flattened hemidiaphragms compatible with COPD. LEFT mid and lower lung zone strandy densities without pleural effusion or focal consolidation. No pneumothorax. Dual lead LEFT cardiac defibrillator in situ. Status post median sternotomy. Soft tissue planes and included osseous structures are nonsuspicious. Surgical clips in the abdomen. IMPRESSION: LEFT mid lung zone atelectasis/ scarring. Electronically Signed       ASSESSMENT AND PLAN:  1. NSTEMI (non-ST elevated myocardial infarction) ([place]) - history of CAD s/p CABG in 2010, LIMA-LAD, SVG-1st Diagonal, SVG-OM, SVG-Posterior Descending of the RCA Cathshowed patent IMA and SVG to the RCA. The SVG to 1st diag and SVG to OM are occluded  Has ruled in for MI . There were no clear culprit vessels to open. Has severe native CAD. Likely had occlusion of a small branch  Angina under control on Coreg and Imdur 30 a day .  Since CAD not amenable to percutaneous intervention palliative care consult obtained. Recommend home hospice care. Patient and wife are agreeable to this. Will plan on DC today if arrangements can be made.  2. Ischemic cardiomyopathy - EF 20% by recent echo s/p ICD placement 2011 holding ACE due to Acute on chronic RF. Creatinine improving but not at baseline yet.   3. Chronic combined systolic and diastolic CHF - EF of 123456, Diffuse hypokinesis, and Grade 1 DD on recent echo in 05/2015. - does not appear volume overloaded on exam, diuresed 600 cc  overnight on no diuretics, clear lungs, less distended abdomen, crea is improving 3.0 --> 2.86--->2.71 -continue Coreg  4. Stage 4 CKD  - s/p left partial nephrectomy in 2010 due to [place] - baseline 2.5 - 2.7. - 3.3-- 2/71  5. Essential HTN Well controlled. We have held the ACE-I due to  worsening renal function I  anticipate restarting it once his creatinine is back to baseline of ~ 2.5   6. HLD - continue statin  7. Type 2 DM  - continue Lantus 30U nightly  8. Constipation - prune juice and laxatives .    The family talked to palliative care yesterday, home hospice was suggested and family is agreeable to this. Plan DC later today. He has an appointment with Dr. Percival Spanish on Jan. 12[place] [facility] 06/05/2015

## 2015-06-05 NOTE — Discharge Summary (Signed)
CARDIOLOGY DISCHARGE SUMMARY   Patient ID: [name] MRN: [id number] DOB/AGE: 02-26-1926 81 y.o.  Admit date: 05/29/2015 Discharge date: 06/05/2015  PCP: Darrell Stain, MD Primary Cardiologist: Dr. Percival Houston  Primary Discharge Diagnosis: NSTEMI (non-ST elevated myocardial infarction) [facility]) Secondary Discharge Diagnosis:    HTN (hypertension)   CKD (chronic kidney disease), stage IV (HCC)   CAD (coronary artery disease)   Ischemic cardiomyopathy   Non-STEMI (non-ST elevated myocardial infarction) ([place])   Chest pain   Dyspnea   Encounter for palliative care   Goals of care, counseling/discussion   Consults: Physical Therapy, Palliative Care  Procedures: Left Heart Catheterization, Coronary Angiography, Transthoracic Echocardiogram   Hospital Course: [name] is a 81 y.o. male with past medical history of CAD (s/p CABG in 2010, LIMA-LAD, SVG-1st Diagonal, SVG-OM, SVG-Posterior Descending of the RCA) ischemic cardiomyopathy (EF 20%, s/p ICD placement 2011), chronic combined systolic and diastolic CHF, Stage 4 CKD (s/p left partial nephrectomy in 2010 due to [place]), HTN, HLD, Type 2 DM who presented to [name] on 05/29/2015 for evaluation of chest pain.  He had developed right shoulder pain the previous night and had a repeat of the pain at 0200 which woke him from sleep. His initial troponin value was 0.52 and a repeat value was 2.79. His creatinine was elevated to 2.73 which was close to his baseline of 2.5-2.7. Due to his continual chest pain, it was recommended he have an urgent catheterization. The risks and benefits of the procedure were discussed with the patient and he agreed to proceed.  His catheterization showed a patent LIMA to the LAD and a patent SVG to the PDA with occluded vein to the diagonal and obtuse marginal branches. The full report is included below. Continued medical therapy was recommended.  On the following morning, he denied any repeat  chest pain. His cyclic troponin values continued to rise at 14.13 and 20.65. Heparin was continued for another day. His repeat echo showed an EF of 20-25% with diffuse hypokinesis. This was consistent with his previous echo obtained on 05/22/2015.  On 05/31/2015, he was noted to be lethargic but arousable. His BP was in the 80's, so 584m NS was administered. His creatinine was elevated to 3.35 so his ACE-I continued to be held.  On 06/01/2015, he developed new onset dyspnea and chest pain. He was placed on 2L [place] and given SL NTG. He unfortunately became hypotensive again with the SL NTG. Imdur 312mdaily and Morphine were added to his medication regimen. Due to his continued episodes of chest pain and his coronary calcifications not being amenable to PCI, a Palliative Care consult was recommended.  Palliative Care met with the patient's family on 06/03/2015 and home hospice was recommended. He was also evaluated by physical therapy and supervision for mobility and home health PT was recommended.  Palliative met with the patient and his family again on 06/04/2015. He was agreeable to Home Hospice and PRN Roxanol for his chest pain was recommended.  He was examined on 06/05/2015 and remained without chest pain or episodes of dyspnea. His vitals were stable and lab results were reviewed. His creatinine remained elevated at 2.71. Therefore, his ACE-I was discontinued at this time and can likely be restarted once his creatinine approaches baseline.  Hospice met with the patient to educate him and his family on the services and benefits of Hospice care. He was given refills for Imdur, SL NTG, and Senokot-S. Orders were also placed for his comfort  medications. He was last examined by Dr. Martinique and deemed stable for discharge to [place]. He will follow-up with Dr. Percival Houston in January.   Labs:   Lab Results  Component Value Date   WBC 6.0 06/05/2015   HGB 10.4* 06/05/2015   HCT 32.8* 06/05/2015    MCV 88.9 06/05/2015   PLT 195 06/05/2015     Recent Labs Lab 06/05/15 0528  NA 136  K 4.9  CL 107  CO2 18*  BUN 59*  CREATININE 2.71*  CALCIUM 8.4*  GLUCOSE 148*   Lipid Panel     Component Value Date/Time   CHOL 142 05/30/2015 0030   TRIG 120 05/30/2015 0030   HDL 42 05/30/2015 0030   CHOLHDL 3.4 05/30/2015 0030   VLDL 24 05/30/2015 0030   LDLCALC 76 05/30/2015 0030   LDLDIRECT 84.0 12/28/2014 1009    No results found for: BNP PRO B NATRIURETIC PEPTIDE (BNP)  Date/Time Value Ref Range Status  11/21/2008 04:06 AM 2014.0* 0.0 - 100.0 pg/mL Final  07/10/2008 12:20 PM 126.0* 0.0 - 100.0 pg/mL Final      Radiology:  Dg Chest 2 View: 05/29/2015  CLINICAL DATA:  RIGHT-sided chest pain, admitted for pneumonia last week. EXAM: CHEST  2 VIEW COMPARISON:  Chest radiograph May 22, 2015 FINDINGS: Cardiac silhouette is upper limits of normal in size, mediastinal silhouette is nonsuspicious. Increased lung volumes with flattened hemidiaphragms compatible with COPD. LEFT mid and lower lung zone strandy densities without pleural effusion or focal consolidation. No pneumothorax. Dual lead LEFT cardiac defibrillator in situ. Status post median sternotomy. Soft tissue planes and included osseous structures are nonsuspicious. Surgical clips in the abdomen. IMPRESSION: LEFT mid lung zone atelectasis/ scarring. Electronically Signed   By: Elon Alas M.D.   On: 05/29/2015 05:20   Cardiac Cath:   Prox RCA to Mid RCA lesion, 100% stenosed.  Prox Cx to Mid Cx lesion, 100% stenosed.  LM lesion, 60% stenosed.  Prox LAD lesion, 80% stenosed.  Mid LAD lesion, 80% stenosed.  Prox Graft lesion, 50% stenosed.  Mid Graft lesion, 50% stenosed.  Origin lesion, 100% stenosed.  Origin to Prox Graft lesion, 100% stenosed.     IMPRESSION:[name] has a patent LIMA to the LAD and a patent vein to the PDA with occluded vein to the diagonal and obtuse marginal branches. He has  severe native coronary artery disease. There are no occult lesions. Etiology of his chest pain and positive enzymes are unclear. Continue medical therapy will be recommended. The sheath was removed and pressure held on the groin to achieve hemostasis. The patient left the lab in stable condition.  EKG: V-paced with rate in 80's. LBBB (old). No acute changes since previous tracing.  Echo: 05/30/2015 Study Conclusions - Left ventricle: The cavity size was mildly dilated. There was mild concentric hypertrophy. Systolic function was severely reduced. The estimated ejection fraction was in the range of 20% to 25%. Diffuse hypokinesis. There was an increased relative contribution of atrial contraction to ventricular filling. Doppler parameters are consistent with abnormal left ventricular relaxation (grade 1 diastolic dysfunction). Doppler parameters are consistent with high ventricular filling pressure. - Aortic valve: Trileaflet; mildly thickened, mildly calcified leaflets. - Mitral valve: There was mild regurgitation.  FOLLOW UP PLANS AND APPOINTMENTS Allergies  Allergen Reactions  . Celecoxib     REACTION: myalgia: abdominal pain  . Kayexalate [Polystyrene] Other (See Comments)    constipation  . Nsaids     REACTION: renal disease  . Rosuvastatin  REACTION: myalgia: musle pain     Medication List    STOP taking these medications        aspirin 325 MG tablet  Replaced by:  aspirin 81 MG EC tablet     lisinopril 2.5 MG tablet  Commonly known as:  PRINIVIL,ZESTRIL      TAKE these medications        alum & mag hydroxide-simeth 200-200-20 MG/5ML suspension  Commonly known as:  MAALOX/MYLANTA  Take 30 mLs by mouth every 2 (two) hours as needed for indigestion.     aspirin 81 MG EC tablet  Take 1 tablet (81 mg total) by mouth daily.     carvedilol 6.25 MG tablet  Commonly known as:  COREG  Take 6.25 mg by mouth 2 (two) times daily.     GLUCERNA SHAKE  PO  Take 4 oz by mouth daily after breakfast.     Insulin Glargine 100 UNIT/ML Solostar Pen  Commonly known as:  LANTUS  Inject 10 Units into the skin daily at 10 pm.     isosorbide mononitrate 30 MG 24 hr tablet  Commonly known as:  IMDUR  Take 1 tablet (30 mg total) by mouth daily.     morphine 4 MG/ML injection  Inject 1 mL (4 mg total) into the vein every hour as needed for moderate pain.     morphine CONCENTRATE 10 MG/0.5ML Soln concentrated solution  Take 0.25 mLs (5 mg total) by mouth every 3 (three) hours as needed for severe pain or shortness of breath.     nitroGLYCERIN 0.4 MG SL tablet  Commonly known as:  NITROSTAT  Place 1 tablet (0.4 mg total) under the tongue every 5 (five) minutes as needed for chest pain.     pravastatin 40 MG tablet  Commonly known as:  PRAVACHOL  TAKE 1 TABLET BY MOUTH AT BEDTIME     senna-docusate 8.6-50 MG tablet  Commonly known as:  Senokot-S  Take 1 tablet by mouth 2 (two) times daily.     testosterone cypionate 200 MG/ML injection  Commonly known as:  DEPOTESTOSTERONE CYPIONATE  INJECT 1.25ML INTO THE MUSCLE EVERY 28 DAYS     Vitamin D 2000 UNITS tablet  Take 2,000 Units by mouth daily.        Follow-up Information    Follow up with Minus Breeding, MD On 06/29/2015.   Specialty:  Cardiology   Why:  Cardiology Follow-Up on 06/29/2015 at 2:00PM   Contact information:   [place] [address] [phone]       BRING ALL MEDICATIONS WITH YOU TO FOLLOW UP APPOINTMENTS  Time spent with patient to include physician time: 40 minutes  Signed: Erma Heritage, PA 06/05/2015, 2:57 PM Co-Sign MD

## 2015-06-05 NOTE — Progress Notes (Signed)
Notified by Jari Favre of family request for Hospice and [place] services at home after discharge. Chart and patient information currently under review to confirm hospice eligibility.  Spoke with patient and wife, at bedside to initiate education related to hospice philosophy, services and team approach to care. Family verbalized understanding of the information provided. Per discussion plan is for discharge to home by personal vehicle with daughter today.  Patient will need prescriptions for discharge comfort medications.  DME needs discussed and family denied any DME needs at this time.  [facility] aware of the above.  Completed discharge summary will need to be faxed to [facility] at [phone] when final. Please notify [facility] when patient is ready to leave unit at discharge-call [phone].  [facility] information and contact numbers have been given to patient during visit. Please call with any questions. Thank Rhae Lerner RN, [facility] Liaison [phone]

## 2015-06-05 NOTE — Progress Notes (Signed)
D/c summary faxed to [facility] via number provided.

## 2015-06-06 ENCOUNTER — Other Ambulatory Visit: Payer: Self-pay

## 2015-06-06 ENCOUNTER — Telehealth: Payer: Self-pay

## 2015-06-06 NOTE — Patient Outreach (Signed)
[facility]) Care Management  06/06/2015  [name] 10/25/1925 [id number]   Received in basket notification from Natividad Brood, hospital liaison that patient discharged from the hospital on 06/05/15 with hospice program.  PLAN: RNCM will notify Lurline Del to close patient due to hospice program following. RNCM will notify patients primary MD of closure.   Quinn Plowman RN,BSN,CCM [place] Coordinator [phone]

## 2015-06-06 NOTE — Telephone Encounter (Signed)
Please try them again later today and let me know what they need.  Thanks.

## 2015-06-06 NOTE — Telephone Encounter (Signed)
PLEASE NOTE: All timestamps contained within this report are represented as [place] Standard Time. CONFIDENTIALTY NOTICE: This fax transmission is intended only for the addressee. It contains information that is legally privileged, confidential or otherwise protected from use or disclosure. If you are not the intended recipient, you are strictly prohibited from reviewing, disclosing, copying using or disseminating any of this information or taking any action in reliance on or regarding this information. If you have received this fax in error, please notify us immediately by telephone so that we can arrange for its return to [place]. Phone: [phone], Toll-Free: [phone], Fax: [phone] Page: 1 of 1 Call Id: [id number] [place] Patient Name: [name] Gender: Male DOB: 1925/08/10 Age: 81 Y 57 M 25 D Return Phone Number: Address: City/State/Zip: [place] Client [place] [facility] - Client Client Site [place] - Day Physician Renford Dills Contact Type Call Call Type Triage / Clinical Caller Name Mateo Flow Relationship To Patient Provider Appointment Disposition EMR Caller Not Reached Info pasted into Epic No Return Phone Number Please choose phone number Chief Complaint Paging or Request for Consult Initial Comment [facility]. [id number]. pt. A.E. Sieloff, 1925-11-14. Needs Rx faxed in. Nurse Assessment Guidelines Guideline Title Affirmed Question Affirmed Notes Nurse Date/Time (Eastern Time) Disp. Time Eilene Ghazi Time) Disposition Final User 06/05/2015 10:42:06 PM Attempt made - message left Zellers, RN, [place] 06/05/2015 10:54:39 PM FINAL ATTEMPT MADE - no message left Yes Zellers, RN, [place] After Care Instructions Given Call Event Type User Date / Time Description

## 2015-06-06 NOTE — Telephone Encounter (Signed)
Unable to reach [place] at [facility] at contact # [phone].

## 2015-06-07 DIAGNOSIS — Z515 Encounter for palliative care: Secondary | ICD-10-CM | POA: Insufficient documentation

## 2015-06-07 NOTE — Telephone Encounter (Addendum)
Called and LMOVM for Katharine Look ([employer]), did talk with Suzi via phone, offered my support.  She'll update me as needed.  I asked her to tell her father that I had called.  I didn't want to bother him at home.  She said she would and thanked me for the call.

## 2015-06-08 ENCOUNTER — Telehealth: Payer: Self-pay | Admitting: Family Medicine

## 2015-06-08 NOTE — Telephone Encounter (Signed)
Hospice called w/update Pt is stable, he has been taking 325mg   Asprin prn as needed for headache cb is [phone] Just wanted to make sure this is ok

## 2015-06-12 NOTE — Telephone Encounter (Signed)
Noted.   Would try tylenol 650mg  po tid prn headache.   Use aspirin if needed, if tylenol fails.   Would limit aspirin to 325mg  BID prn headache.  Thanks.

## 2015-06-13 ENCOUNTER — Telehealth: Payer: Self-pay

## 2015-06-13 NOTE — Telephone Encounter (Signed)
[place] (Hospice) advised.

## 2015-06-13 NOTE — Telephone Encounter (Signed)
Evie with [facility] left v/m;FYI to Dr Damita Dunnings; pt is using [facility] topical patch on rt shoulder; pt thinks this is helpful; pt also uses [place] cream on knees and that has helped knee pain. Evie is going to update pts med profile and just wanted Dr Damita Dunnings to be aware; FYI only.

## 2015-06-14 NOTE — Telephone Encounter (Signed)
Noted, shouldn't be a problem.  Thanks.

## 2015-06-16 ENCOUNTER — Telehealth: Payer: Self-pay | Admitting: Family Medicine

## 2015-06-18 DIAGNOSIS — 419620001 Death: Secondary | SNOMED CT | POA: Diagnosis not present

## 2015-06-18 NOTE — Telephone Encounter (Signed)
I called his daughter and offered my condolences.   I was glad to see this kind man. Please update the chart.  Thanks.

## 2015-06-18 NOTE — Telephone Encounter (Signed)
[facility] called to let Dr. Damita Dunnings know that pt expired 07-12-2015 at 3:58 AM   Any questions CB# [phone]

## 2015-06-18 DEATH — deceased

## 2015-06-20 NOTE — Telephone Encounter (Signed)
Message sent to [facility] for chart update.

## 2015-06-29 ENCOUNTER — Ambulatory Visit: Payer: Self-pay | Admitting: Cardiology

## 2016-10-29 IMAGING — DX DG CHEST 2V
2 series · 2 of 2 positions shown · non-contrast
Comparison: 03/07/2014 CT.  02/10/2012 radiographs.

CLINICAL DATA: Awakened at [DATE] feeling weak and dyspnea. Sharp
substernal pain.

EXAM:
CHEST  2 VIEW

[chest lat]
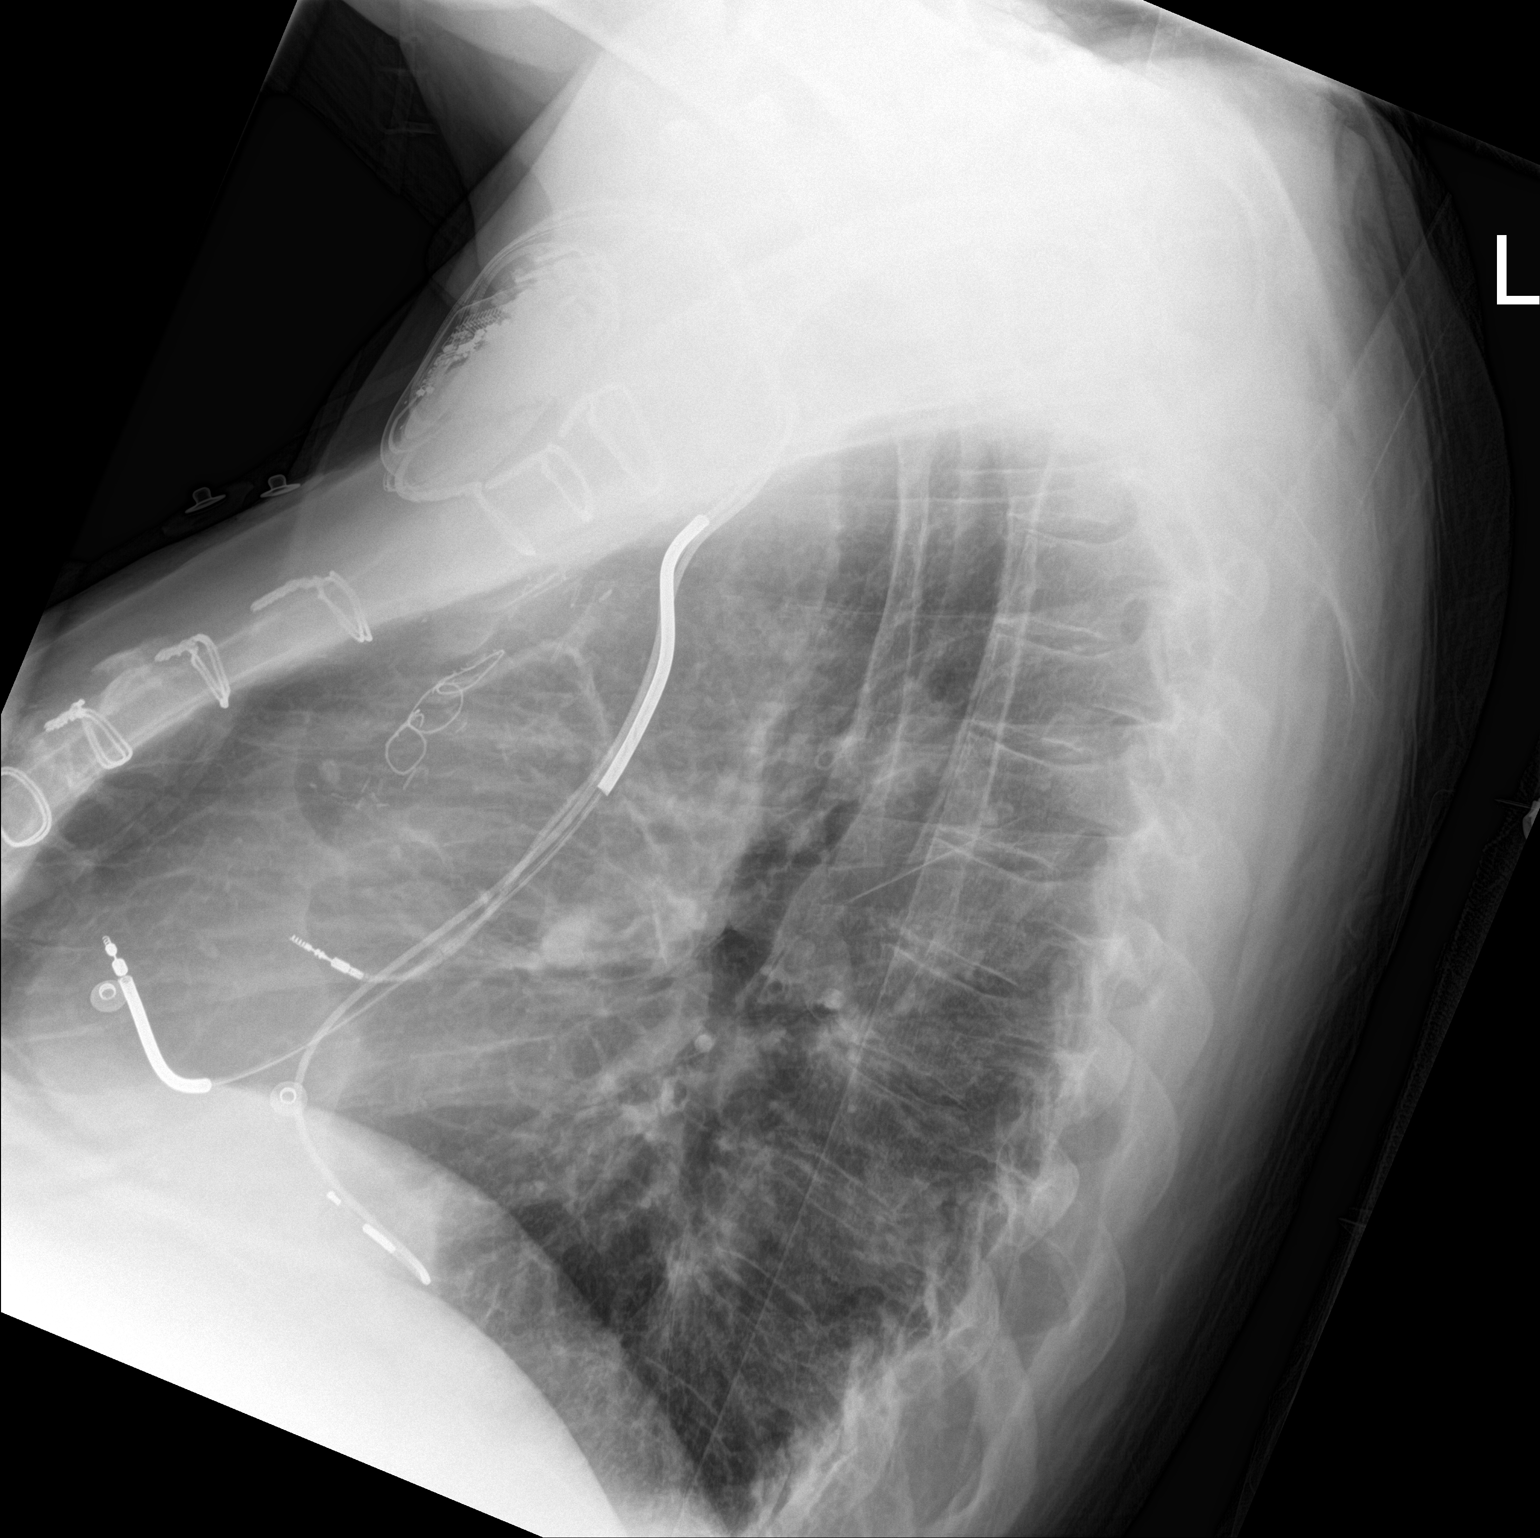

[chest ap]
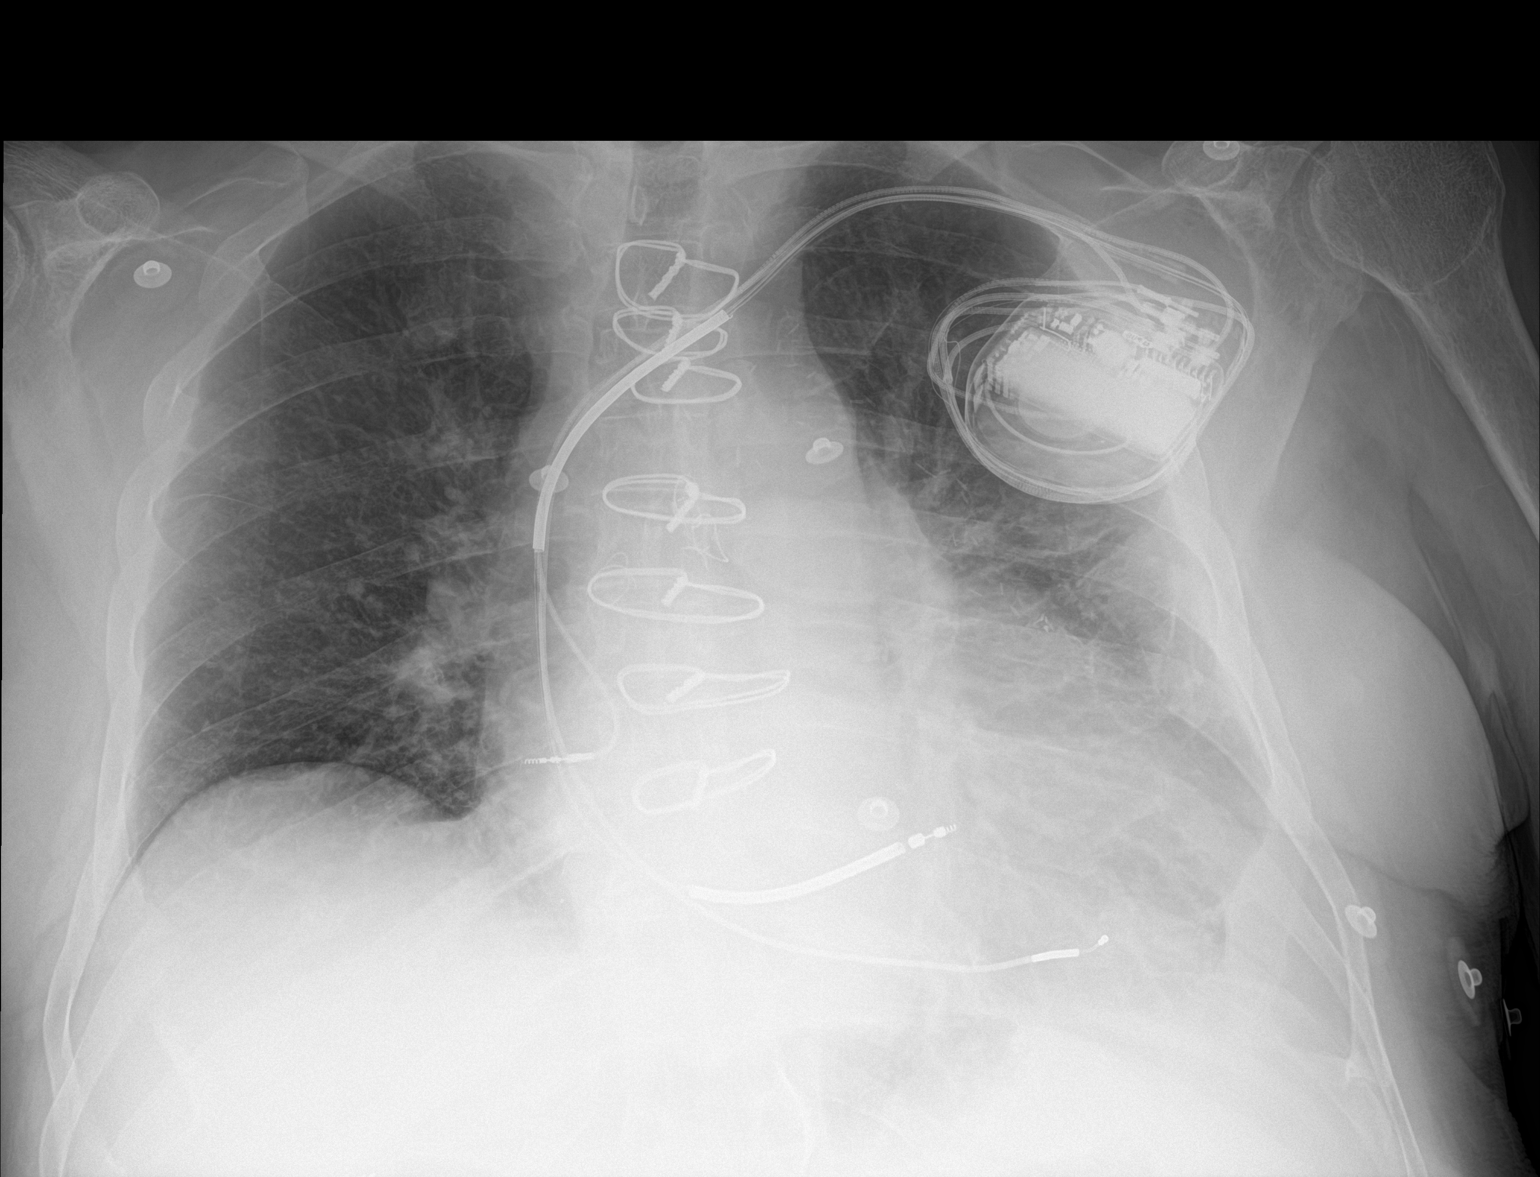

[2 of 2 positions shown; findings below may reference images not displayed]

FINDINGS: Opacity in the lateral left base may represent early infiltrate. The
right lung is clear. Pulmonary vasculature is normal. There is
unchanged moderate cardiomegaly. There are intact appearances of the
transvenous cardiac leads.
IMPRESSION: Lateral left base opacity, possibly early infiltrate or atelectasis.

## 2016-11-06 IMAGING — CR DG CHEST 2V
2 series · 2 of 2 positions shown · non-contrast
Comparison: Chest radiograph May 22, 2015

CLINICAL DATA: RIGHT-sided chest pain, admitted for pneumonia last
week.

EXAM:
CHEST  2 VIEW

[chest pa]
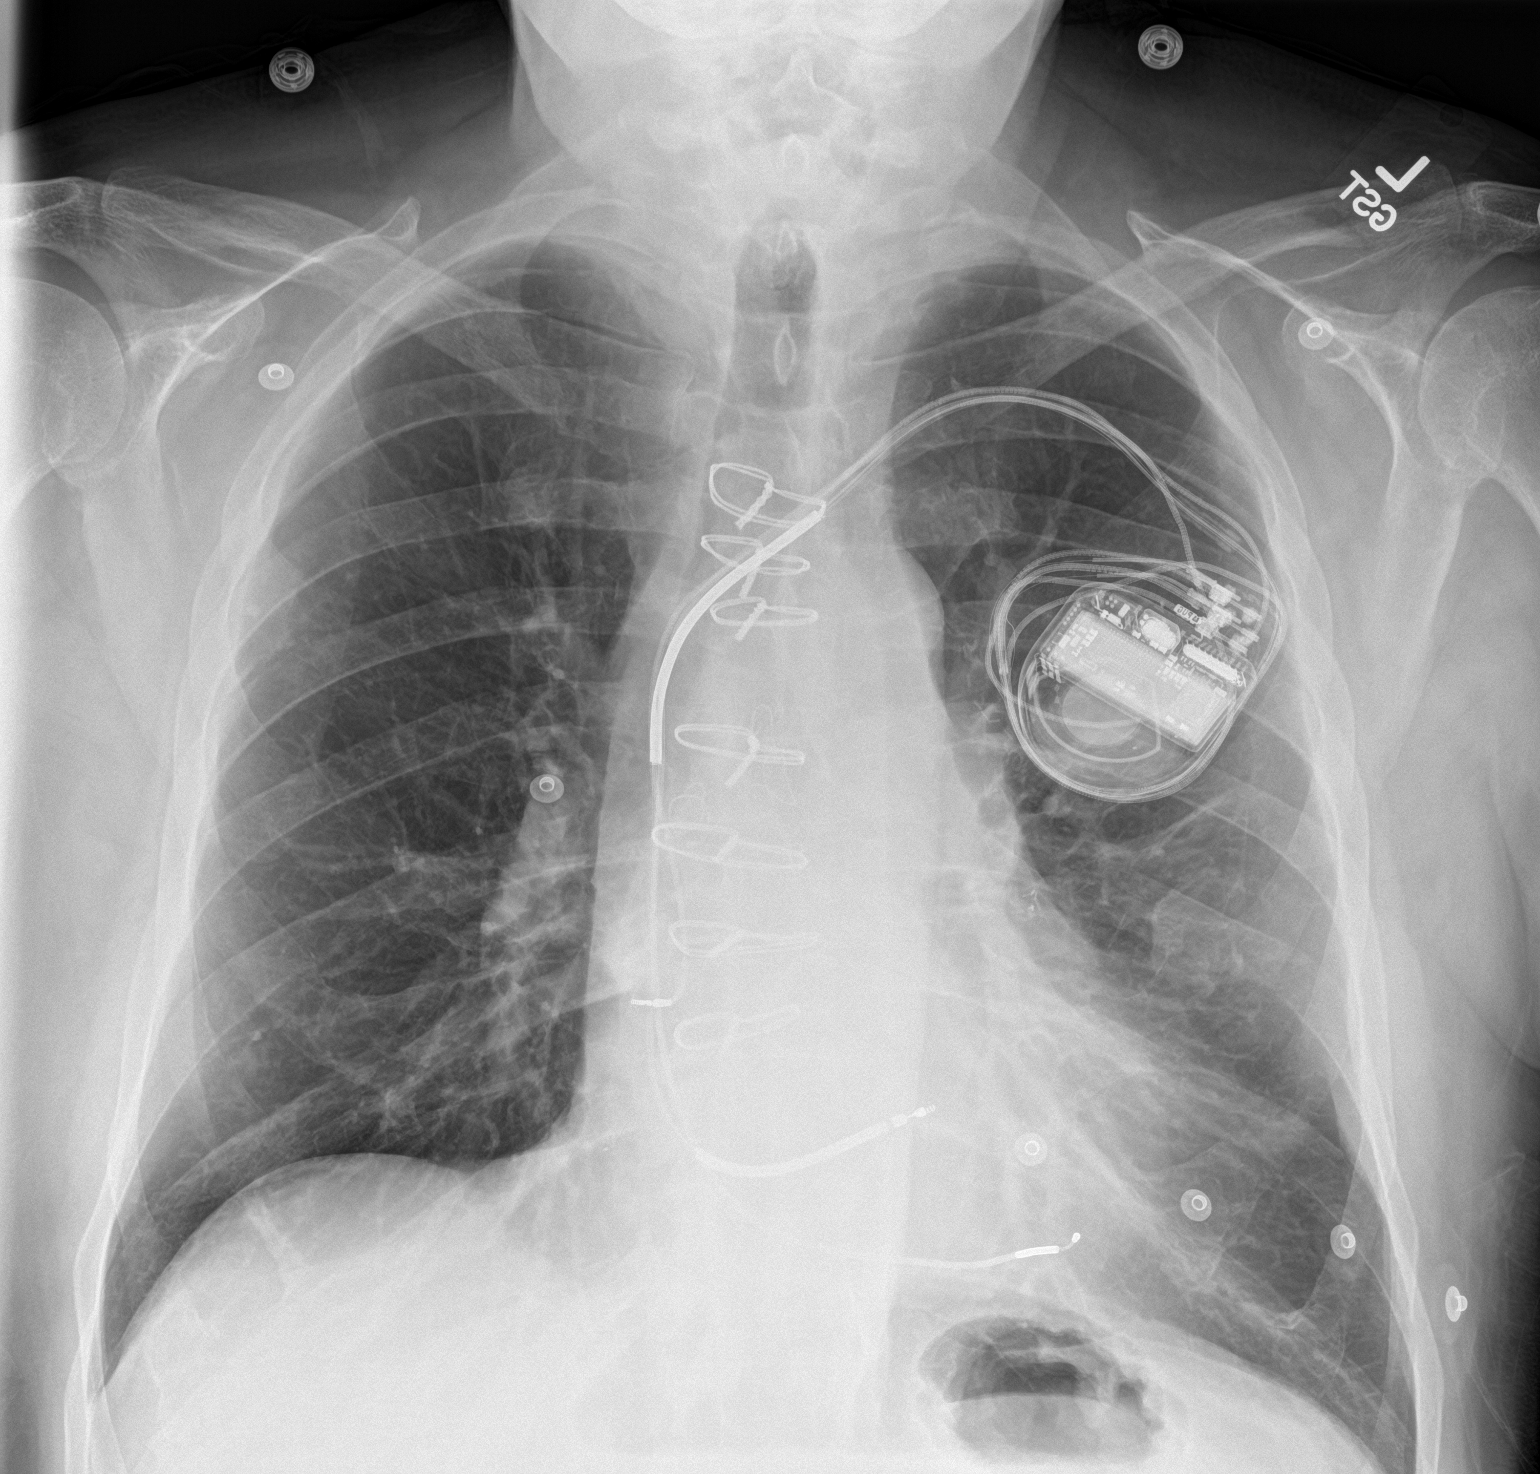

[chest lat]
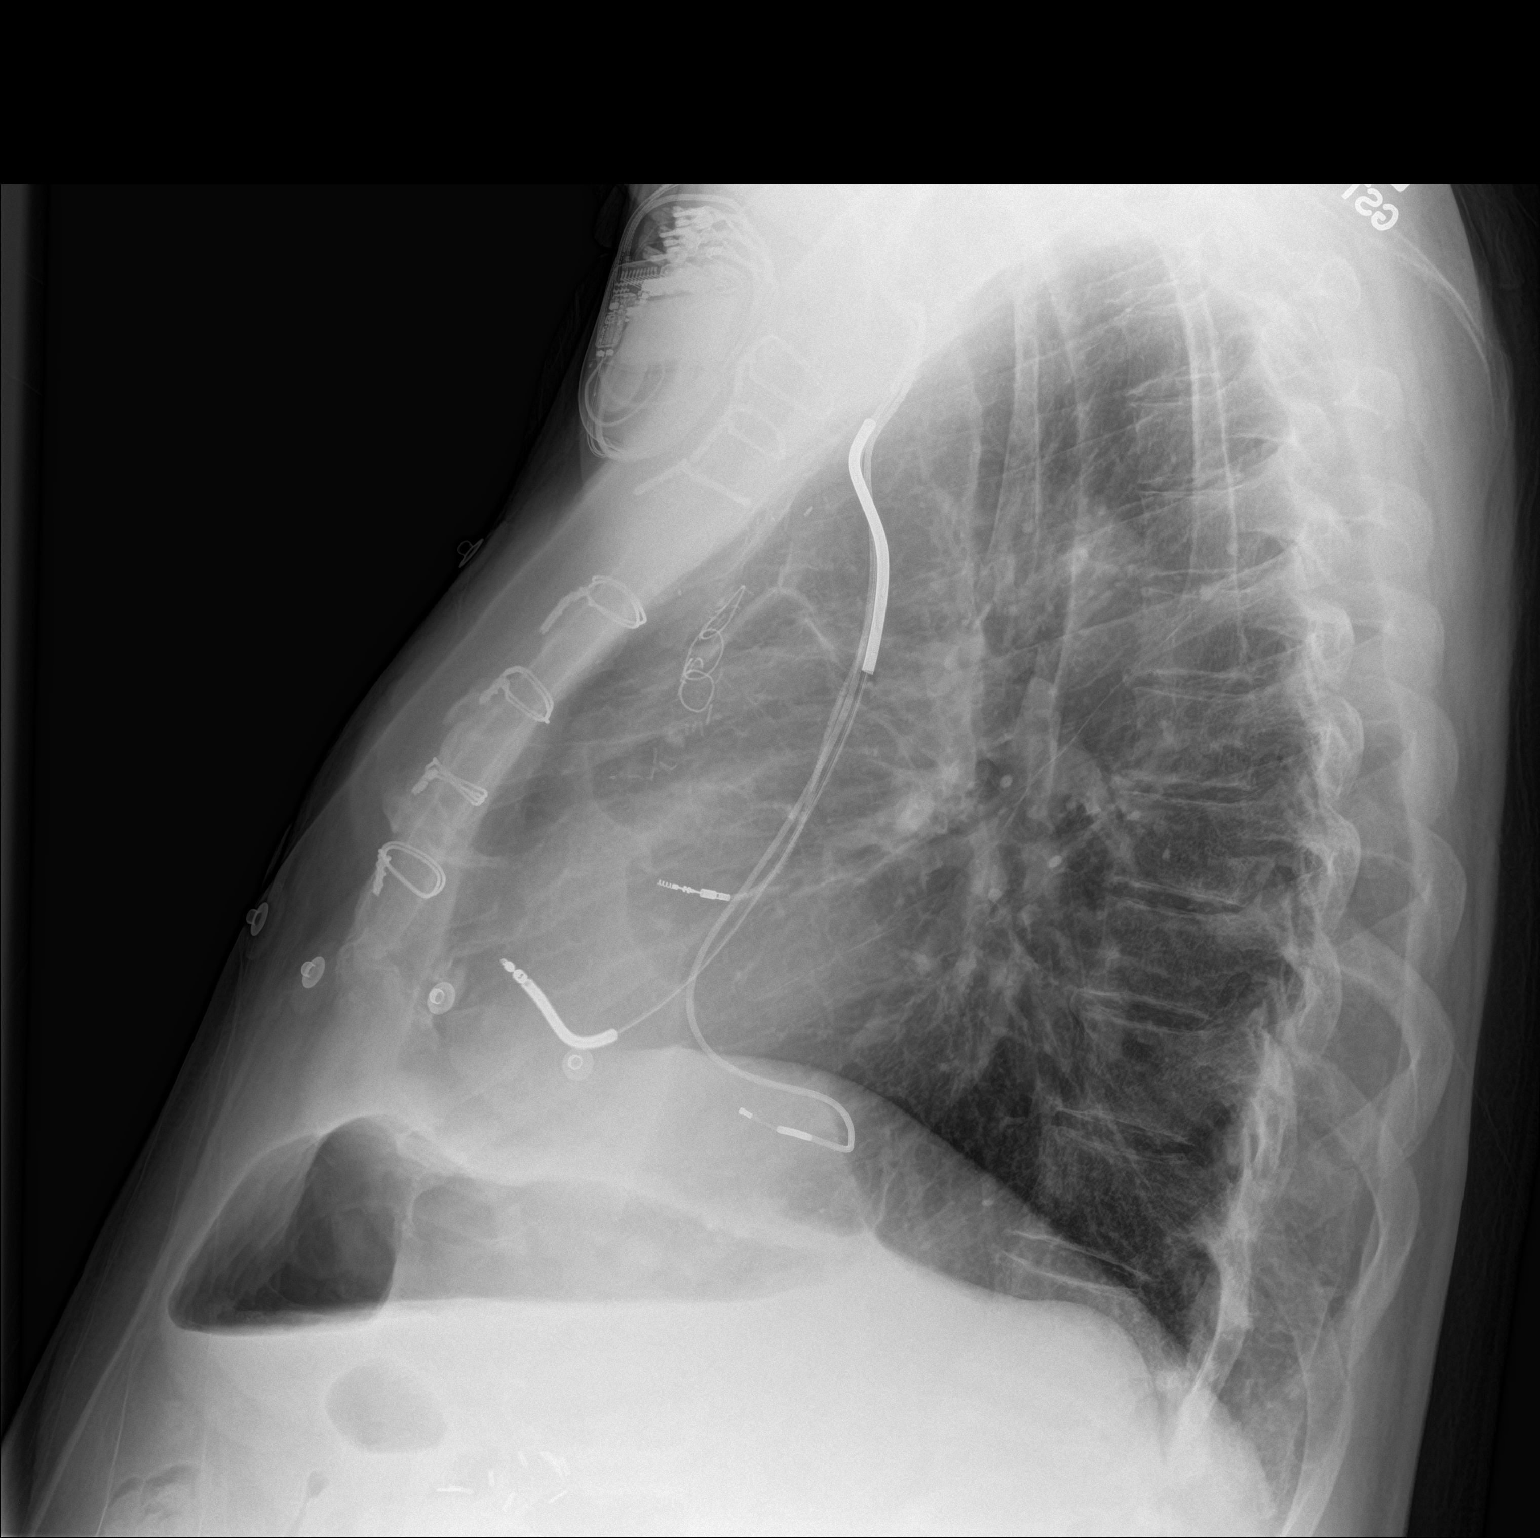

[2 of 2 positions shown; findings below may reference images not displayed]

FINDINGS: Cardiac silhouette is upper limits of normal in size, mediastinal
silhouette is nonsuspicious. Increased lung volumes with flattened
hemidiaphragms compatible with COPD. LEFT mid and lower lung zone
strandy densities without pleural effusion or focal consolidation.
No pneumothorax. Dual lead LEFT cardiac defibrillator in situ.
Status post median sternotomy. Soft tissue planes and included
osseous structures are nonsuspicious. Surgical clips in the abdomen.
IMPRESSION: LEFT mid lung zone atelectasis/ scarring.
